# Patient Record
Sex: Male | Born: 1990 | Race: White | Hispanic: No | Marital: Single | Smoking: Current every day smoker
Health system: Southern US, Community
[De-identification: ages and names within clinical notes are randomized; demographics above are authoritative.]

## PROBLEM LIST (undated history)

## (undated) DIAGNOSIS — M549 Dorsalgia, unspecified: Secondary | ICD-10-CM

## (undated) DIAGNOSIS — K4090 Unilateral inguinal hernia, without obstruction or gangrene, not specified as recurrent: Secondary | ICD-10-CM

## (undated) DIAGNOSIS — R011 Cardiac murmur, unspecified: Secondary | ICD-10-CM

---
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
## 2008-01-11 ENCOUNTER — Emergency Department: Payer: Self-pay | Admitting: Emergency Medicine

## 2008-04-26 ENCOUNTER — Emergency Department: Payer: Self-pay | Admitting: Emergency Medicine

## 2008-08-03 ENCOUNTER — Emergency Department: Payer: Self-pay | Admitting: Unknown Physician Specialty

## 2008-08-18 ENCOUNTER — Emergency Department: Payer: Self-pay | Admitting: Emergency Medicine

## 2010-01-09 ENCOUNTER — Emergency Department: Payer: Self-pay | Admitting: Emergency Medicine

## 2010-03-13 ENCOUNTER — Emergency Department: Payer: Self-pay | Admitting: Emergency Medicine

## 2010-03-20 ENCOUNTER — Emergency Department: Payer: Self-pay | Admitting: Emergency Medicine

## 2010-05-29 ENCOUNTER — Emergency Department: Payer: Self-pay | Admitting: Emergency Medicine

## 2010-05-30 ENCOUNTER — Emergency Department: Payer: Self-pay | Admitting: Emergency Medicine

## 2011-02-10 ENCOUNTER — Emergency Department: Payer: Self-pay | Admitting: *Deleted

## 2011-02-16 ENCOUNTER — Emergency Department: Payer: Self-pay

## 2012-08-16 ENCOUNTER — Emergency Department: Payer: Self-pay | Admitting: Emergency Medicine

## 2012-09-27 ENCOUNTER — Emergency Department: Payer: Self-pay | Admitting: Emergency Medicine

## 2012-09-29 ENCOUNTER — Emergency Department: Payer: Self-pay | Admitting: Unknown Physician Specialty

## 2013-01-27 ENCOUNTER — Emergency Department: Payer: Self-pay | Admitting: Emergency Medicine

## 2013-01-27 LAB — CBC WITH DIFFERENTIAL/PLATELET
Basophil %: 0.7 %
Eosinophil #: 0.3 10*3/uL (ref 0.0–0.7)
Eosinophil %: 2.7 %
HCT: 45.4 % (ref 40.0–52.0)
Lymphocyte #: 3.8 10*3/uL — ABNORMAL HIGH (ref 1.0–3.6)
Lymphocyte %: 37.7 %
MCH: 29 pg (ref 26.0–34.0)
MCV: 87 fL (ref 80–100)
Monocyte %: 5.4 %
Neutrophil #: 5.4 10*3/uL (ref 1.4–6.5)
Neutrophil %: 53.5 %
WBC: 10.1 10*3/uL (ref 3.8–10.6)

## 2013-01-27 LAB — CK TOTAL AND CKMB (NOT AT ARMC)
CK, Total: 531 U/L — ABNORMAL HIGH (ref 35–232)
CK-MB: 3.1 ng/mL (ref 0.5–3.6)

## 2013-01-27 LAB — URINALYSIS, COMPLETE
Bacteria: NONE SEEN
Bilirubin,UR: NEGATIVE
Blood: NEGATIVE
Glucose,UR: NEGATIVE mg/dL (ref 0–75)
Ketone: NEGATIVE
Nitrite: NEGATIVE
Ph: 6 (ref 4.5–8.0)
RBC,UR: 3 /HPF (ref 0–5)

## 2013-01-27 LAB — BASIC METABOLIC PANEL
BUN: 13 mg/dL (ref 7–18)
Calcium, Total: 9 mg/dL (ref 8.5–10.1)
Chloride: 107 mmol/L (ref 98–107)
Co2: 28 mmol/L (ref 21–32)
EGFR (African American): >60
EGFR (Non-African Amer.): >60
Osmolality: 278 (ref 275–301)
Potassium: 4.2 mmol/L (ref 3.5–5.1)
Sodium: 139 mmol/L (ref 136–145)

## 2013-04-01 ENCOUNTER — Emergency Department: Payer: Self-pay | Admitting: Emergency Medicine

## 2013-04-02 LAB — COMPREHENSIVE METABOLIC PANEL
Albumin: 3.8 g/dL (ref 3.4–5.0)
Anion Gap: 5 — ABNORMAL LOW (ref 7–16)
Chloride: 105 mmol/L (ref 98–107)
Co2: 29 mmol/L (ref 21–32)
EGFR (Non-African Amer.): >60
Glucose: 107 mg/dL — ABNORMAL HIGH (ref 65–99)
Osmolality: 277 (ref 275–301)
Potassium: 4 mmol/L (ref 3.5–5.1)
Sodium: 139 mmol/L (ref 136–145)

## 2013-04-02 LAB — CBC WITH DIFFERENTIAL/PLATELET
Eosinophil #: 0.3 10*3/uL (ref 0.0–0.7)
Eosinophil %: 3.7 %
MCH: 27.8 pg (ref 26.0–34.0)
MCHC: 31.9 g/dL — ABNORMAL LOW (ref 32.0–36.0)
Monocyte #: 0.7 x10 3/mm (ref 0.2–1.0)
Monocyte %: 7.9 %
Neutrophil %: 53.9 %
RDW: 13.5 % (ref 11.5–14.5)
WBC: 9 10*3/uL (ref 3.8–10.6)

## 2013-09-11 DIAGNOSIS — R079 Chest pain, unspecified: Secondary | ICD-10-CM | POA: Insufficient documentation

## 2013-09-11 DIAGNOSIS — R0600 Dyspnea, unspecified: Secondary | ICD-10-CM | POA: Insufficient documentation

## 2013-09-11 DIAGNOSIS — R0609 Other forms of dyspnea: Secondary | ICD-10-CM | POA: Insufficient documentation

## 2013-12-26 ENCOUNTER — Emergency Department: Payer: Self-pay | Admitting: Emergency Medicine

## 2014-03-17 ENCOUNTER — Emergency Department (HOSPITAL_COMMUNITY)
Admission: EM | Admit: 2014-03-17 | Discharge: 2014-03-17 | Disposition: A | Discharge status: Home / Self Care | Payer: Self-pay | Attending: Emergency Medicine | Admitting: Emergency Medicine

## 2014-03-17 ENCOUNTER — Encounter (HOSPITAL_COMMUNITY): Payer: Self-pay | Admitting: *Deleted

## 2014-03-17 DIAGNOSIS — K4090 Unilateral inguinal hernia, without obstruction or gangrene, not specified as recurrent: Secondary | ICD-10-CM | POA: Insufficient documentation

## 2014-03-17 DIAGNOSIS — Z720 Tobacco use: Secondary | ICD-10-CM | POA: Insufficient documentation

## 2014-03-17 DIAGNOSIS — R011 Cardiac murmur, unspecified: Secondary | ICD-10-CM | POA: Insufficient documentation

## 2014-03-17 HISTORY — DX: Cardiac murmur, unspecified: R01.1

## 2014-03-17 HISTORY — DX: Unilateral inguinal hernia, without obstruction or gangrene, not specified as recurrent: K40.90

## 2014-03-17 MED ORDER — IBUPROFEN 800 MG PO TABS: 800.0000 mg | ORAL_TABLET | Freq: Once | ORAL | Status: DC

## 2014-03-17 MED ORDER — IBUPROFEN 800 MG PO TABS: 800.0000 mg | ORAL_TABLET | Freq: Three times a day (TID) | ORAL | Status: DC | PRN

## 2014-03-17 NOTE — Discharge Instructions (Signed)
Follow up with dr. Lovell SheehanJenkins next wee

## 2014-03-17 NOTE — ED Provider Notes (Signed)
CSN: [id number]     Arrival date & time 03/17/14  1901 History  This chart was scribed for Patrick LennertJoseph L Armenia Silveria, MD by Ronney LionSuzanne Le, ED Scribe. This patient was seen in room APA18/APA18 and the patient's care was started at 8:52 PM.    Chief Complaint  Patient presents with  . Groin Pain   Patient is a 23 y.o. male presenting with groin pain. The history is provided by the patient. No language interpreter was used.  Groin Pain This is a new problem. The current episode started more than 1 week ago. The problem occurs constantly. The problem has been gradually worsening. Pertinent negatives include no chest pain, no abdominal pain and no headaches.     HPI Comments: Filbert SchilderJonathon Selvage is a 23 y.o. male who presents to the Emergency Department complaining of constant, worsening groin pain accompanying a hernia that began few weeks ago. [facility] who did not refer him to a Careers advisersurgeon. Patient works in [employer].  Past Medical History  Diagnosis Date  . Heart murmur   . Hernia, inguinal    History reviewed. No pertinent past surgical history. History reviewed. No pertinent family history. History  Substance Use Topics  . Smoking status: Current Every Day Smoker  . Smokeless tobacco: Not on file  . Alcohol Use: No    Review of Systems  Constitutional: Negative for appetite change and fatigue.  HENT: Negative for congestion, ear discharge and sinus pressure.   Eyes: Negative for discharge.  Respiratory: Negative for cough.   Cardiovascular: Negative for chest pain.  Gastrointestinal: Negative for abdominal pain and diarrhea.  Genitourinary: Negative for frequency and hematuria.  Musculoskeletal: Negative for back pain.  Skin: Negative for rash.  Neurological: Negative for seizures and headaches.  Psychiatric/Behavioral: Negative for hallucinations.      Allergies  Review of patient's allergies indicates no known allergies.  Home Medications   Prior to  Admission medications   Not on File   BP 117/71 mmHg  Pulse 60  Temp(Src) 97.6 F (36.4 C) (Oral)  Resp 18  Ht 6' (1.829 m)  Wt 185 lb (83.915 kg)  BMI 25.08 kg/m2  SpO2 100% Physical Exam  Constitutional: He is oriented to person, place, and time. He appears well-developed.  HENT:  Head: Normocephalic.  Eyes: Conjunctivae and EOM are normal. No scleral icterus.  Neck: Neck supple. No thyromegaly present.  Cardiovascular: Normal rate and regular rhythm.  Exam reveals no gallop and no friction rub.   No murmur heard. Pulmonary/Chest: No stridor. He has no wheezes. He has no rales. He exhibits no tenderness.  Abdominal: He exhibits no distension. There is no tenderness. There is no rebound.  Genitourinary:  Tenderness to inguinal hernia on left side.  Musculoskeletal: Normal range of motion. He exhibits no edema.  Lymphadenopathy:    He has no cervical adenopathy.  Neurological: He is oriented to person, place, and time. He exhibits normal muscle tone. Coordination normal.  Skin: No rash noted. No erythema.  Psychiatric: He has a normal mood and affect. His behavior is normal.  Nursing note and vitals reviewed.   ED Course  Procedures (including critical care time)  DIAGNOSTIC STUDIES: Oxygen Saturation is 100% on RA, normal by my interpretation.    COORDINATION OF CARE: 8:53 PM - Discussed treatment plan with pt at bedside which includes pain medication and a referral to a surgeon and pt agreed to plan.   Labs Review Labs Reviewed - No data  to display  Imaging Review No results found.   EKG Interpretation None      MDM   Final diagnoses:  None    The chart was scribed for me under my direct supervision.  I personally performed the history, physical, and medical decision making and all procedures in the evaluation of this patient.Marland Kitchen. }    Patrick LennertJoseph L Legend Pecore, MD 03/18/14 0001

## 2014-03-17 NOTE — ED Notes (Signed)
Pt states he lifts lots of heavy items at work and feels like he has a hernia in his groin.

## 2014-05-15 ENCOUNTER — Emergency Department: Payer: Self-pay | Admitting: Student

## 2014-05-15 LAB — CBC WITH DIFFERENTIAL/PLATELET
BASOS PCT: 0.7 %
Basophil #: 0 10*3/uL (ref 0.0–0.1)
EOS PCT: 2.8 %
Eosinophil #: 0.2 10*3/uL (ref 0.0–0.7)
HCT: 41.6 % (ref 40.0–52.0)
HGB: 13.9 g/dL (ref 13.0–18.0)
Lymphocyte #: 3.4 10*3/uL (ref 1.0–3.6)
Lymphocyte %: 49.6 %
MCH: 28.6 pg (ref 26.0–34.0)
MCHC: 33.4 g/dL (ref 32.0–36.0)
MCV: 86 fL (ref 80–100)
MONO ABS: 0.5 x10 3/mm (ref 0.2–1.0)
MONOS PCT: 7.8 %
Neutrophil #: 2.7 10*3/uL (ref 1.4–6.5)
Neutrophil %: 39.1 %
PLATELETS: 182 10*3/uL (ref 150–440)
RBC: 4.86 10*6/uL (ref 4.40–5.90)
RDW: 13.3 % (ref 11.5–14.5)
WBC: 6.9 10*3/uL (ref 3.8–10.6)

## 2014-05-15 LAB — COMPREHENSIVE METABOLIC PANEL
ALK PHOS: 65 U/L (ref 46–116)
ALT: 46 U/L (ref 14–63)
Albumin: 3.6 g/dL (ref 3.4–5.0)
Anion Gap: 9 (ref 7–16)
BUN: 11 mg/dL (ref 7–18)
Bilirubin,Total: 0.3 mg/dL (ref 0.2–1.0)
CREATININE: 1.19 mg/dL (ref 0.60–1.30)
Calcium, Total: 8.6 mg/dL (ref 8.5–10.1)
Chloride: 105 mmol/L (ref 98–107)
Co2: 28 mmol/L (ref 21–32)
EGFR (African American): >60
EGFR (Non-African Amer.): >60
Glucose: 105 mg/dL — ABNORMAL HIGH (ref 65–99)
Osmolality: 283 (ref 275–301)
Potassium: 3.5 mmol/L (ref 3.5–5.1)
SGOT(AST): 25 U/L (ref 15–37)
Sodium: 142 mmol/L (ref 136–145)
TOTAL PROTEIN: 7.1 g/dL (ref 6.4–8.2)

## 2014-05-15 LAB — URINALYSIS, COMPLETE
BLOOD: NEGATIVE
Bacteria: NONE SEEN
Bilirubin,UR: NEGATIVE
Glucose,UR: NEGATIVE mg/dL (ref 0–75)
KETONE: NEGATIVE
Leukocyte Esterase: NEGATIVE
NITRITE: NEGATIVE
PH: 6 (ref 4.5–8.0)
Protein: NEGATIVE
RBC,UR: 2 /HPF (ref 0–5)
SPECIFIC GRAVITY: 1.024 (ref 1.003–1.030)
Squamous Epithelial: NONE SEEN
WBC UR: 21 /HPF (ref 0–5)

## 2014-05-15 LAB — LIPASE, BLOOD: Lipase: 164 U/L (ref 73–393)

## 2014-08-09 ENCOUNTER — Emergency Department
Admit: 2014-08-09 | Disposition: A | Discharge status: Home / Self Care | Payer: Self-pay | Admitting: Emergency Medicine

## 2014-08-09 LAB — COMPREHENSIVE METABOLIC PANEL
ALT: 38 U/L
ANION GAP: 8 (ref 7–16)
Albumin: 4.6 g/dL
Alkaline Phosphatase: 58 U/L
BUN: 13 mg/dL
Bilirubin,Total: 0.2 mg/dL — ABNORMAL LOW
CALCIUM: 9.4 mg/dL
CO2: 29 mmol/L
Chloride: 104 mmol/L
Creatinine: 1.06 mg/dL
EGFR (African American): >60
GLUCOSE: 96 mg/dL
Potassium: 3.7 mmol/L
SGOT(AST): 33 U/L
SODIUM: 141 mmol/L
Total Protein: 7.7 g/dL

## 2014-08-09 LAB — CBC
HCT: 43.6 % (ref 40.0–52.0)
HGB: 15 g/dL (ref 13.0–18.0)
MCH: 29.6 pg (ref 26.0–34.0)
MCHC: 34.3 g/dL (ref 32.0–36.0)
MCV: 86 fL (ref 80–100)
PLATELETS: 199 10*3/uL (ref 150–440)
RBC: 5.05 10*6/uL (ref 4.40–5.90)
RDW: 13.6 % (ref 11.5–14.5)
WBC: 8.2 10*3/uL (ref 3.8–10.6)

## 2014-08-09 LAB — LIPASE, BLOOD: LIPASE: 40 U/L

## 2014-08-09 LAB — TROPONIN I: Troponin-I: <0.03 ng/mL

## 2014-10-20 ENCOUNTER — Emergency Department: Payer: Self-pay

## 2014-10-20 ENCOUNTER — Emergency Department
Admission: EM | Admit: 2014-10-20 | Discharge: 2014-10-20 | Disposition: A | Discharge status: Home / Self Care | Payer: Self-pay | Attending: Emergency Medicine | Admitting: Emergency Medicine

## 2014-10-20 ENCOUNTER — Encounter: Payer: Self-pay | Admitting: Emergency Medicine

## 2014-10-20 DIAGNOSIS — S61219A Laceration without foreign body of unspecified finger without damage to nail, initial encounter: Secondary | ICD-10-CM

## 2014-10-20 DIAGNOSIS — S61215A Laceration without foreign body of left ring finger without damage to nail, initial encounter: Secondary | ICD-10-CM | POA: Insufficient documentation

## 2014-10-20 DIAGNOSIS — Y9289 Other specified places as the place of occurrence of the external cause: Secondary | ICD-10-CM | POA: Insufficient documentation

## 2014-10-20 DIAGNOSIS — S6710XA Crushing injury of unspecified finger(s), initial encounter: Secondary | ICD-10-CM

## 2014-10-20 DIAGNOSIS — Y9389 Activity, other specified: Secondary | ICD-10-CM | POA: Insufficient documentation

## 2014-10-20 DIAGNOSIS — W5522XA Struck by cow, initial encounter: Secondary | ICD-10-CM | POA: Insufficient documentation

## 2014-10-20 DIAGNOSIS — S67195A Crushing injury of left ring finger, initial encounter: Secondary | ICD-10-CM | POA: Insufficient documentation

## 2014-10-20 DIAGNOSIS — Y998 Other external cause status: Secondary | ICD-10-CM | POA: Insufficient documentation

## 2014-10-20 DIAGNOSIS — Z23 Encounter for immunization: Secondary | ICD-10-CM | POA: Insufficient documentation

## 2014-10-20 MED ORDER — LIDOCAINE HCL (PF) 1 % IJ SOLN
5.0000 mL | Freq: Once | INTRAMUSCULAR | Status: AC
  Administered 2014-10-20: 5 mL

## 2014-10-20 MED ORDER — KETOROLAC TROMETHAMINE 10 MG PO TABS
ORAL_TABLET | ORAL | Status: AC
  Filled 2014-10-20: qty 2

## 2014-10-20 MED ORDER — KETOROLAC TROMETHAMINE 10 MG PO TABS
20.0000 mg | ORAL_TABLET | Freq: Once | ORAL | Status: AC
  Administered 2014-10-20: 20 mg via ORAL

## 2014-10-20 MED ORDER — TETANUS-DIPHTH-ACELL PERTUSSIS 5-2.5-18.5 LF-MCG/0.5 IM SUSP
0.5000 mL | Freq: Once | INTRAMUSCULAR | Status: AC
  Administered 2014-10-20: 0.5 mL via INTRAMUSCULAR
  Filled 2014-10-20: qty 0.5

## 2014-10-20 MED ORDER — HYDROCODONE-ACETAMINOPHEN 5-325 MG PO TABS: 1.0000 | ORAL_TABLET | ORAL | Status: DC | PRN

## 2014-10-20 MED ORDER — SULFAMETHOXAZOLE-TRIMETHOPRIM 800-160 MG PO TABS
2.0000 | ORAL_TABLET | Freq: Two times a day (BID) | ORAL | Status: DC
Start: 2014-10-20 — End: 2015-07-29
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
## 2014-10-20 NOTE — Discharge Instructions (Signed)
Crush Injury, Fingers or Toes A crush injury to the fingers or toes means the tissues have been damaged by being squeezed (compressed). There will be bleeding into the tissues and swelling. Often, blood will collect under the skin. When this happens, the skin on the finger often dies and may slough off (shed) 1 week to 10 days later. Usually, new skin is growing underneath. If the injury has been too severe and the tissue does not survive, the damaged tissue may begin to turn black over several days.  Wounds which occur because of the crushing may be stitched (sutured) shut. However, crush injuries are more likely to become infected than other injuries.These wounds may not be closed as tightly as other types of cuts to prevent infection. Nails involved are often lost. These usually grow back over several weeks.  DIAGNOSIS X-rays may be taken to see if there is any injury to the bones. TREATMENT Broken bones (fractures) may be treated with splinting, depending on the fracture. Often, no treatment is required for fractures of the last bone in the fingers or toes. HOME CARE INSTRUCTIONS   The crushed part should be raised (elevated) above the heart or center of the chest as much as possible for the first several days or as directed. This helps with pain and lessens swelling. Less swelling increases the chances that the crushed part will survive.  Put ice on the injured area.  Put ice in a plastic bag.  Place a towel between your skin and the bag.  Leave the ice on for 15-20 minutes, 03-04 times a day for the first 2 days.  Only take over-the-counter or prescription medicines for pain, discomfort, or fever as directed by your caregiver.  Use your injured part only as directed.  Change your bandages (dressings) as directed.  Keep all follow-up appointments as directed by your caregiver. Not keeping your appointment could result in a chronic or permanent injury, pain, and disability. If there is  any problem keeping the appointment, you must call to reschedule. SEEK IMMEDIATE MEDICAL CARE IF:   There is redness, swelling, or increasing pain in the wound area.  Pus is coming from the wound.  You have a fever.  You notice a bad smell coming from the wound or dressing.  The edges of the wound do not stay together after the sutures have been removed.  You are unable to move the injured finger or toe. MAKE SURE YOU:   Understand these instructions.  Will watch your condition.  Will get help right away if you are not doing well or get worse. Document Released: 03/30/2005 Document Revised: 06/22/2011 Document Reviewed: 08/15/2010 [facility] Patient Information 2015 [place]. This information is not intended to replace advice given to you by your health care provider. Make sure you discuss any questions you have with your health care provider.  Laceration Care, Adult A laceration is a cut that goes through all layers of the skin. The cut goes into the tissue beneath the skin. HOME CARE For stitches (sutures) or staples:  Keep the cut clean and dry.  If you have a bandage (dressing), change it at least once a day. Change the bandage if it gets wet or dirty, or as told by your doctor.  Wash the cut with soap and water 2 times a day. Rinse the cut with water. Pat it dry with a clean towel.  Put a thin layer of medicated cream on the cut as told by your doctor.  You may shower after the first 24 hours. Do not soak the cut in water until the stitches are removed.  Only take medicines as told by your doctor.  Have your stitches or staples removed as told by your doctor. For skin adhesive strips:  Keep the cut clean and dry.  Do not get the strips wet. You may take a bath, but be careful to keep the cut dry.  If the cut gets wet, pat it dry with a clean towel.  The strips will fall off on their own. Do not remove the strips that are still stuck to the cut. For wound  glue:  You may shower or take baths. Do not soak or scrub the cut. Do not swim. Avoid heavy sweating until the glue falls off on its own. After a shower or bath, pat the cut dry with a clean towel.  Do not put medicine on your cut until the glue falls off.  If you have a bandage, do not put tape over the glue.  Avoid lots of sunlight or tanning lamps until the glue falls off. Put sunscreen on the cut for the first year to reduce your scar.  The glue will fall off on its own. Do not pick at the glue. You may need a tetanus shot if:  You cannot remember when you had your last tetanus shot.  You have never had a tetanus shot. If you need a tetanus shot and you choose not to have one, you may get tetanus. Sickness from tetanus can be serious. GET HELP RIGHT AWAY IF:   Your pain does not get better with medicine.  Your arm, hand, leg, or foot loses feeling (numbness) or changes color.  Your cut is bleeding.  Your joint feels weak, or you cannot use your joint.  You have painful lumps on your body.  Your cut is red, puffy (swollen), or painful.  You have a red line on the skin near the cut.  You have yellowish-white fluid (pus) coming from the cut.  You have a fever.  You have a bad smell coming from the cut or bandage.  Your cut breaks open before or after stitches are removed.  You notice something coming out of the cut, such as wood or glass.  You cannot move a finger or toe. MAKE SURE YOU:   Understand these instructions.  Will watch your condition.  Will get help right away if you are not doing well or get worse. Document Released: 09/16/2007 Document Revised: 06/22/2011 Document Reviewed: 09/23/2010 [facility] Patient Information 2015 [place], [place]. This information is not intended to replace advice given to you by your health care provider. Make sure you discuss any questions you have with your health care provider.  Keep the wound & dressing clean, dry, and  covered.  Take the antibiotic as directed.  Follow-up with Dr. Ernest Pine for any difficulty with finger ROM.  Return to the ED in 2 weeks for suture removal.  Return as needed for wound checks.

## 2014-10-20 NOTE — ED Notes (Signed)
Sterile dressing cover, instructions to keep clean and dry
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
## 2015-02-19 ENCOUNTER — Encounter: Payer: Self-pay | Admitting: Emergency Medicine

## 2015-02-19 ENCOUNTER — Emergency Department
Admission: EM | Admit: 2015-02-19 | Discharge: 2015-02-19 | Disposition: A | Discharge status: Home / Self Care | Payer: Self-pay | Attending: Emergency Medicine | Admitting: Emergency Medicine

## 2015-02-19 DIAGNOSIS — J301 Allergic rhinitis due to pollen: Secondary | ICD-10-CM | POA: Insufficient documentation

## 2015-02-19 DIAGNOSIS — L723 Sebaceous cyst: Secondary | ICD-10-CM | POA: Insufficient documentation

## 2015-02-19 DIAGNOSIS — Z79899 Other long term (current) drug therapy: Secondary | ICD-10-CM | POA: Insufficient documentation

## 2015-02-19 MED ORDER — CEPHALEXIN 500 MG PO CAPS: 500.0000 mg | ORAL_CAPSULE | Freq: Two times a day (BID) | ORAL | Status: DC

## 2015-02-19 MED ORDER — LORATADINE 10 MG PO TABS: 10.0000 mg | ORAL_TABLET | Freq: Every day | ORAL | Status: DC

## 2015-02-19 MED ORDER — DIPHENHYDRAMINE HCL 25 MG PO CAPS
50.0000 mg | ORAL_CAPSULE | Freq: Once | ORAL | Status: AC
  Administered 2015-02-19: 50 mg via ORAL
  Filled 2015-02-19: qty 2

## 2015-02-19 MED ORDER — LORATADINE 10 MG PO TABS
10.0000 mg | ORAL_TABLET | Freq: Once | ORAL | Status: AC
  Administered 2015-02-19: 10 mg via ORAL
  Filled 2015-02-19: qty 1

## 2015-02-19 NOTE — ED Notes (Addendum)
Patient ambulatory to triage with steady gait, without difficulty or distress noted; pt reports recently working around hay moving bales; c/o generalized itching since; st "I looked it up on the internet and I got a cold now, so it said I could have hay fever"

## 2015-02-19 NOTE — Discharge Instructions (Signed)
Sebaceous Cyst Removal Sebaceous cyst removal is a procedure to remove a sac of oily material that forms under your skin (sebaceous cyst). Sebaceous cysts may also be called epidermoid cysts or keratin cysts. Normally, the skin secretes this oily material through a gland or a hair follicle. This type of cyst usually results when a skin gland or hair follicle becomes blocked. You may need this procedure if you have a sebaceous cyst that becomes large, uncomfortable, or infected. LET YOUR HEALTH CARE PROVIDER KNOW ABOUT:  Any allergies you have.  All medicines you are taking, including vitamins, herbs, eye drops, creams, and over-the-counter medicines.  Previous problems you or members of your family have had with the use of anesthetics.  Any blood disorders you have.  Previous surgeries you have had.  Medical conditions you have. RISKS AND COMPLICATIONS Generally, this is a safe procedure. However, problems may occur, including:  Developing another cyst.  Bleeding.  Infection.  Scarring. BEFORE THE PROCEDURE  Ask your health care provider about:  Changing or stopping your regular medicines. This is especially important if you are taking diabetes medicines or blood thinners.  Taking medicines such as aspirin and ibuprofen. These medicines can thin your blood. Do not take these medicines before your procedure if your health care provider instructs you not to.  If you have an infected cyst, you may have to take antibiotic medicines before or after the cyst removal. Take your antibiotics as directed by your health care provider. Finish all of the medicine even if you start to feel better.  Take a shower on the morning of your procedure. Your health care provider may ask you to use a germ-killing (antiseptic) soap. PROCEDURE  You will be given a medicine that numbs the area (local anesthetic).  The skin around the cyst will be cleaned with a germ-killing solution  (antiseptic).  Your health care provider will make a small surgical incision over the cyst.  The cyst will be separated from the surrounding tissues that are under your skin.  If possible, the cyst will be removed undamaged (intact).  If the cyst bursts (ruptures), it will need to be removed in pieces.  After the cyst is removed, your health care provider will control any bleeding and close the incision with small stitches (sutures). Small incisions may not need sutures, and the bleeding will be controlled by applying direct pressure with gauze.  Your health care provider may apply antibiotic ointment and a light bandage (dressing) over the incision. This procedure may vary among health care providers and hospitals. AFTER THE PROCEDURE  If your cyst ruptured during surgery, you may need to take antibiotic medicine. If you were prescribed an antibiotic medicine, finish all of it even if you start to feel better.   This information is not intended to replace advice given to you by your health care provider. Make sure you discuss any questions you have with your health care provider.   Document Released: 03/27/2000 Document Revised: 04/20/2014 Document Reviewed: 12/13/2013 Elsevier Interactive Patient Education 2016 [employer]vier Inc.  

## 2015-02-19 NOTE — ED Provider Notes (Signed)
[facility] Emergency Department Provider Note  ____________________________________________  Time seen: 6:45 AM  I have reviewed the triage vital signs and the nursing notes.   HISTORY  Chief Complaint Rash     HPI Patrick Huffman is a 24 y.o. male presents with generalized pruritus and nasal congestion status post moving bales of hay yesterday. Patient denies any fever no dyspnea no cough. In addition patient admits to a swollen tender area on his left maxilla times one and half months.  Past Medical History none There are no active problems to display for this patient.  Past surgical history None  Current Outpatient Rx  Name  Route  Sig  Dispense  Refill  . HYDROcodone-acetaminophen (NORCO) 5-325 MG per tablet   Oral   Take 1 tablet by mouth every 4 (four) hours as needed for moderate pain.   6 tablet   0   . sulfamethoxazole-trimethoprim (BACTRIM DS,SEPTRA DS) 800-160 MG per tablet   Oral   Take 2 tablets by mouth 2 (two) times daily.   40 tablet   0     Allergies No known drug allergies  No family history on file.  Social History Social History  Substance Use Topics  . Smoking status: Never Smoker   . Smokeless tobacco: None  . Alcohol Use: No    Review of Systems  Constitutional: Negative for fever. Eyes: Negative for visual changes. ENT: Negative for sore throat. Cardiovascular: Negative for chest pain. Respiratory: Negative for shortness of breath. Gastrointestinal: Negative for abdominal pain, vomiting and diarrhea. Genitourinary: Negative for dysuria. Musculoskeletal: Negative for back pain. Skin: Positive for rash. Neurological: Negative for headaches, focal weakness or numbness.   10-point ROS otherwise negative.  ____________________________________________   PHYSICAL EXAM:  VITAL SIGNS: ED Triage Vitals  Enc Vitals Group     BP 02/19/15 0431 124/68 mmHg     Pulse Rate 02/19/15 0431 62     Resp  02/19/15 0431 18     Temp 02/19/15 0431 98.1 F (36.7 C)     Temp Source 02/19/15 0431 Oral     SpO2 02/19/15 0431 98 %     Weight 02/19/15 0431 185 lb (83.915 kg)     Height 02/19/15 0431 6' (1.829 m)     Head Cir --      Peak Flow --      Pain Score --      Pain Loc --      Pain Edu? --      Excl. in GC? --      Constitutional: Alert and oriented. Well appearing and in no distress. Eyes: Conjunctivae are normal. PERRL. Normal extraocular movements. ENT   Head: Normocephalic and atraumatic.   Nose: No congestion/rhinnorhea.   Mouth/Throat: Mucous membranes are moist.   Neck: No stridor. Hematological/Lymphatic/Immunilogical: No cervical lymphadenopathy. Cardiovascular: Normal rate, regular rhythm. Normal and symmetric distal pulses are present in all extremities. No murmurs, rubs, or gallops. Respiratory: Normal respiratory effort without tachypnea nor retractions. Breath sounds are clear and equal bilaterally. No wheezes/rales/rhonchi. Gastrointestinal: Soft and nontender. No distention. There is no CVA tenderness. Genitourinary: deferred Musculoskeletal: Nontender with normal range of motion in all extremities. No joint effusions.  No lower extremity tenderness nor edema. Neurologic:  Normal speech and language. No gross focal neurologic deficits are appreciated. Speech is normal.  Skin:  Petechial rash noted on the patient's chest and bilateral upper extremities. Patient also has a 1" x 1" ovoid swollen area on his left  maxilla consistent with a sebaceous cyst Psychiatric: Mood and affect are normal. Speech and behavior are normal. Patient exhibits appropriate insight and judgment.     INITIAL IMPRESSION / ASSESSMENT AND PLAN / ED COURSE  Pertinent labs & imaging results that were available during my care of the patient were reviewed by me and considered in my medical decision making (see chart for details).  History of physical exam consistent with acute  allergic reaction as such patient received Benadryl 50 mg as well as loratadine 10 mg by mouth  ____________________________________________   FINAL CLINICAL IMPRESSION(S) / ED DIAGNOSES  Final diagnoses:  Hay fever  Sebaceous cyst      Darci Current, MD 02/19/15 [phone]

## 2015-02-20 ENCOUNTER — Emergency Department: Payer: No Typology Code available for payment source

## 2015-02-20 ENCOUNTER — Emergency Department
Admission: EM | Admit: 2015-02-20 | Discharge: 2015-02-20 | Disposition: A | Discharge status: Home / Self Care | Payer: No Typology Code available for payment source | Attending: Emergency Medicine | Admitting: Emergency Medicine

## 2015-02-20 DIAGNOSIS — S3991XA Unspecified injury of abdomen, initial encounter: Secondary | ICD-10-CM | POA: Insufficient documentation

## 2015-02-20 DIAGNOSIS — S0990XA Unspecified injury of head, initial encounter: Secondary | ICD-10-CM | POA: Insufficient documentation

## 2015-02-20 DIAGNOSIS — Y92410 Unspecified street and highway as the place of occurrence of the external cause: Secondary | ICD-10-CM | POA: Insufficient documentation

## 2015-02-20 DIAGNOSIS — Y990 Civilian activity done for income or pay: Secondary | ICD-10-CM | POA: Diagnosis not present

## 2015-02-20 DIAGNOSIS — Y9389 Activity, other specified: Secondary | ICD-10-CM | POA: Diagnosis not present

## 2015-02-20 DIAGNOSIS — S199XXA Unspecified injury of neck, initial encounter: Secondary | ICD-10-CM | POA: Insufficient documentation

## 2015-02-20 DIAGNOSIS — Z79899 Other long term (current) drug therapy: Secondary | ICD-10-CM | POA: Insufficient documentation

## 2015-02-20 DIAGNOSIS — S5001XA Contusion of right elbow, initial encounter: Secondary | ICD-10-CM | POA: Insufficient documentation

## 2015-02-20 DIAGNOSIS — Z792 Long term (current) use of antibiotics: Secondary | ICD-10-CM | POA: Insufficient documentation

## 2015-02-20 MED ORDER — KETOROLAC TROMETHAMINE 30 MG/ML IJ SOLN
60.0000 mg | Freq: Once | INTRAMUSCULAR | Status: AC
Start: 2015-02-20 — End: 2015-02-20
  Administered 2015-02-20: 60 mg via INTRAMUSCULAR
  Filled 2015-02-20: qty 2

## 2015-02-20 MED ORDER — TRAMADOL HCL 50 MG PO TABS: 50.0000 mg | ORAL_TABLET | Freq: Four times a day (QID) | ORAL | Status: DC | PRN
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
## 2015-02-20 NOTE — Discharge Instructions (Signed)
Concussion, Adult °A concussion, or closed-head injury, is a brain injury caused by a direct blow to the head or by a quick and sudden movement (jolt) of the head or neck. Concussions are usually not life-threatening. Even so, the effects of a concussion can be serious. If you have had a concussion before, you are more likely to experience concussion-like symptoms after a direct blow to the head.  °CAUSES °· Direct blow to the head, such as from running into another player during a soccer game, being hit in a fight, or hitting your head on a hard surface. °· A jolt of the head or neck that causes the brain to move back and forth inside the skull, such as in a car crash. °SIGNS AND SYMPTOMS °The signs of a concussion can be hard to notice. Early on, they may be missed by you, family members, and health care providers. You may look fine but act or feel differently. °Symptoms are usually temporary, but they may last for days, weeks, or even longer. Some symptoms may appear right away while others may not show up for hours or days. Every head injury is different. Symptoms include: °· Mild to moderate headaches that will not go away. °· A feeling of pressure inside your head. °· Having more trouble than usual: °¨ Learning or remembering things you have heard. °¨ Answering questions. °¨ Paying attention or concentrating. °¨ Organizing daily tasks. °¨ Making decisions and solving problems. °· Slowness in thinking, acting or reacting, speaking, or reading. °· Getting lost or being easily confused. °· Feeling tired all the time or lacking energy (fatigued). °· Feeling drowsy. °· Sleep disturbances. °¨ Sleeping more than usual. °¨ Sleeping less than usual. °¨ Trouble falling asleep. °¨ Trouble sleeping (insomnia). °· Loss of balance or feeling lightheaded or dizzy. °· Nausea or vomiting. °· Numbness or tingling. °· Increased sensitivity to: °¨ Sounds. °¨ Lights. °¨ Distractions. °· Vision problems or eyes that tire  easily. °· Diminished sense of taste or smell. °· Ringing in the ears. °· Mood changes such as feeling sad or anxious. °· Becoming easily irritated or angry for little or no reason. °· Lack of motivation. °· Seeing or hearing things other people do not see or hear (hallucinations). °DIAGNOSIS °Your health care provider can usually diagnose a concussion based on a description of your injury and symptoms. He or she will ask whether you passed out (lost consciousness) and whether you are having trouble remembering events that happened right before and during your injury. °Your evaluation might include: °· A brain scan to look for signs of injury to the brain. Even if the test shows no injury, you may still have a concussion. °· Blood tests to be sure other problems are not present. °TREATMENT °· Concussions are usually treated in an emergency department, in urgent care, or at a clinic. You may need to stay in the hospital overnight for further treatment. °· Tell your health care provider if you are taking any medicines, including prescription medicines, over-the-counter medicines, and natural remedies. Some medicines, such as blood thinners (anticoagulants) and aspirin, may increase the chance of complications. Also tell your health care provider whether you have had alcohol or are taking illegal drugs. This information may affect treatment. °· Your health care provider will send you home with important instructions to follow. °· How fast you will recover from a concussion depends on many factors. These factors include how severe your concussion is, what part of your brain was injured,   your age, and how healthy you were before the concussion. °· Most people with mild injuries recover fully. Recovery can take time. In general, recovery is slower in older persons. Also, persons who have had a concussion in the past or have other medical problems may find that it takes longer to recover from their current injury. °HOME  CARE INSTRUCTIONS °General Instructions °· Carefully follow the directions your health care provider gave you. °· Only take over-the-counter or prescription medicines for pain, discomfort, or fever as directed by your health care provider. °· Take only those medicines that your health care provider has approved. °· Do not drink alcohol until your health care provider says you are well enough to do so. Alcohol and certain other drugs may slow your recovery and can put you at risk of further injury. °· If it is harder than usual to remember things, write them down. °· If you are easily distracted, try to do one thing at a time. For example, do not try to watch TV while fixing dinner. °· Talk with family members or close friends when making important decisions. °· Keep all follow-up appointments. Repeated evaluation of your symptoms is recommended for your recovery. °· Watch your symptoms and tell others to do the same. Complications sometimes occur after a concussion. Older adults with a brain injury may have a higher risk of serious complications, such as a blood clot on the brain. °· Tell your teachers, school nurse, school counselor, coach, athletic trainer, or work manager about your injury, symptoms, and restrictions. Tell them about what you can or cannot do. They should watch for: °¨ Increased problems with attention or concentration. °¨ Increased difficulty remembering or learning new information. °¨ Increased time needed to complete tasks or assignments. °¨ Increased irritability or decreased ability to cope with stress. °¨ Increased symptoms. °· Rest. Rest helps the brain to heal. Make sure you: °¨ Get plenty of sleep at night. Avoid staying up late at night. °¨ Keep the same bedtime hours on weekends and weekdays. °¨ Rest during the day. Take daytime naps or rest breaks when you feel tired. °· Limit activities that require a lot of thought or concentration. These include: °¨ Doing homework or job-related  work. °¨ Watching TV. °¨ Working on the computer. °· Avoid any situation where there is potential for another head injury (football, hockey, soccer, basketball, martial arts, downhill snow sports and horseback riding). Your condition will get worse every time you experience a concussion. You should avoid these activities until you are evaluated by the appropriate follow-up health care providers. °Returning To Your Regular Activities °You will need to return to your normal activities slowly, not all at once. You must give your body and brain enough time for recovery. °· Do not return to sports or other athletic activities until your health care provider tells you it is safe to do so. °· Ask your health care provider when you can drive, ride a bicycle, or operate heavy machinery. Your ability to react may be slower after a brain injury. Never do these activities if you are dizzy. °· Ask your health care provider about when you can return to work or school. °Preventing Another Concussion °It is very important to avoid another brain injury, especially before you have recovered. In rare cases, another injury can lead to permanent brain damage, brain swelling, or death. The risk of this is greatest during the first 7-10 days after a head injury. Avoid injuries by: °· Wearing a   seat belt when riding in a car.  Drinking alcohol only in moderation.  Wearing a helmet when biking, skiing, skateboarding, skating, or doing similar activities.  Avoiding activities that could lead to a second concussion, such as contact or recreational sports, until your health care provider says it is okay.  Taking safety measures in your home.  Remove clutter and tripping hazards from floors and stairways.  Use grab bars in bathrooms and handrails by stairs.  Place non-slip mats on floors and in bathtubs.  Improve lighting in dim areas. SEEK MEDICAL CARE IF:  You have increased problems paying attention or  concentrating.  You have increased difficulty remembering or learning new information.  You need more time to complete tasks or assignments than before.  You have increased irritability or decreased ability to cope with stress.  You have more symptoms than before. Seek medical care if you have any of the following symptoms for more than 2 weeks after your injury:  Lasting (chronic) headaches.  Dizziness or balance problems.  Nausea.  Vision problems.  Increased sensitivity to noise or light.  Depression or mood swings.  Anxiety or irritability.  Memory problems.  Difficulty concentrating or paying attention.  Sleep problems.  Feeling tired all the time. SEEK IMMEDIATE MEDICAL CARE IF:  You have severe or worsening headaches. These may be a sign of a blood clot in the brain.  You have weakness (even if only in one hand, leg, or part of the face).  You have numbness.  You have decreased coordination.  You vomit repeatedly.  You have increased sleepiness.  One pupil is larger than the other.  You have convulsions.  You have slurred speech.  You have increased confusion. This may be a sign of a blood clot in the brain.  You have increased restlessness, agitation, or irritability.  You are unable to recognize people or places.  You have neck pain.  It is difficult to wake you up.  You have unusual behavior changes.  You lose consciousness. MAKE SURE YOU:  Understand these instructions.  Will watch your condition.  Will get help right away if you are not doing well or get worse.   This information is not intended to replace advice given to you by your health care provider. Make sure you discuss any questions you have with your health care provider.   Document Released: 06/20/2003 Document Revised: 04/20/2014 Document Reviewed: 10/20/2012 [employer] Interactive Patient Education [employer]2016 [employer]  Please return immediately if condition worsens.  Please contact her primary physician or the physician you were given for referral. If you have any specialist physicians involved in her treatment and plan please also contact them. Thank you for using [facility].  Please rest, ice, and elevate the right elbow or any other areas of discomfort. Return to emergency department especially for focal weakness, increasing chest pain, increasing abdominal pain, or any new concerns.

## 2015-05-16 ENCOUNTER — Emergency Department
Admission: EM | Admit: 2015-05-16 | Discharge: 2015-05-17 | Disposition: A | Discharge status: Home / Self Care | Payer: Self-pay | Attending: Emergency Medicine | Admitting: Emergency Medicine

## 2015-05-16 DIAGNOSIS — Z79899 Other long term (current) drug therapy: Secondary | ICD-10-CM | POA: Insufficient documentation

## 2015-05-16 DIAGNOSIS — R1012 Left upper quadrant pain: Secondary | ICD-10-CM | POA: Insufficient documentation

## 2015-05-16 DIAGNOSIS — F17200 Nicotine dependence, unspecified, uncomplicated: Secondary | ICD-10-CM | POA: Insufficient documentation

## 2015-05-16 DIAGNOSIS — Z792 Long term (current) use of antibiotics: Secondary | ICD-10-CM | POA: Insufficient documentation

## 2015-05-16 DIAGNOSIS — R110 Nausea: Secondary | ICD-10-CM | POA: Insufficient documentation

## 2015-05-16 LAB — CBC
HCT: 47.1 % (ref 40.0–52.0)
HEMOGLOBIN: 15.7 g/dL (ref 13.0–18.0)
MCH: 28.6 pg (ref 26.0–34.0)
MCHC: 33.3 g/dL (ref 32.0–36.0)
MCV: 85.8 fL (ref 80.0–100.0)
Platelets: 199 10*3/uL (ref 150–440)
RBC: 5.48 MIL/uL (ref 4.40–5.90)
RDW: 13.6 % (ref 11.5–14.5)
WBC: 6.9 10*3/uL (ref 3.8–10.6)

## 2015-05-16 LAB — COMPREHENSIVE METABOLIC PANEL
ALBUMIN: 4.8 g/dL (ref 3.5–5.0)
ALT: 49 U/L (ref 17–63)
ANION GAP: 6 (ref 5–15)
AST: 43 U/L — ABNORMAL HIGH (ref 15–41)
Alkaline Phosphatase: 68 U/L (ref 38–126)
BILIRUBIN TOTAL: 0.4 mg/dL (ref 0.3–1.2)
BUN: 14 mg/dL (ref 6–20)
CALCIUM: 9.4 mg/dL (ref 8.9–10.3)
CO2: 29 mmol/L (ref 22–32)
Chloride: 102 mmol/L (ref 101–111)
Creatinine, Ser: 0.97 mg/dL (ref 0.61–1.24)
GLUCOSE: 126 mg/dL — AB (ref 65–99)
POTASSIUM: 4.1 mmol/L (ref 3.5–5.1)
Sodium: 137 mmol/L (ref 135–145)
Total Protein: 8 g/dL (ref 6.5–8.1)

## 2015-05-16 LAB — URINALYSIS COMPLETE WITH MICROSCOPIC (ARMC ONLY)
Bacteria, UA: NONE SEEN
Bilirubin Urine: NEGATIVE
GLUCOSE, UA: NEGATIVE mg/dL
HGB URINE DIPSTICK: NEGATIVE
Ketones, ur: NEGATIVE mg/dL
Nitrite: NEGATIVE
PROTEIN: NEGATIVE mg/dL
SPECIFIC GRAVITY, URINE: 1.017 (ref 1.005–1.030)
Squamous Epithelial / LPF: NONE SEEN
pH: 5 (ref 5.0–8.0)

## 2015-05-16 LAB — LIPASE, BLOOD: Lipase: 33 U/L (ref 11–51)

## 2015-05-16 NOTE — ED Notes (Signed)
Pt c/o abdominal pain x 4 days. Pt reports increased pain at night. Pt reports LUQ that radiates into lower abdomen.

## 2015-05-17 ENCOUNTER — Encounter: Payer: Self-pay | Admitting: Radiology

## 2015-05-17 ENCOUNTER — Emergency Department: Payer: Self-pay

## 2015-05-17 MED ORDER — IOHEXOL 300 MG/ML  SOLN
100.0000 mL | Freq: Once | INTRAMUSCULAR | Status: AC | PRN
  Administered 2015-05-17: 100 mL via INTRAVENOUS

## 2015-05-17 MED ORDER — ONDANSETRON HCL 4 MG/2ML IJ SOLN
INTRAMUSCULAR | Status: AC
  Administered 2015-05-17: 4 mg via INTRAVENOUS
  Filled 2015-05-17: qty 2

## 2015-05-17 MED ORDER — HYDROCODONE-ACETAMINOPHEN 5-325 MG PO TABS: 1.0000 | ORAL_TABLET | ORAL | Status: DC | PRN

## 2015-05-17 MED ORDER — ONDANSETRON HCL 4 MG/2ML IJ SOLN
4.0000 mg | Freq: Once | INTRAMUSCULAR | Status: AC
  Administered 2015-05-17: 4 mg via INTRAVENOUS

## 2015-05-17 MED ORDER — IOHEXOL 240 MG/ML SOLN
25.0000 mL | Freq: Once | INTRAMUSCULAR | Status: AC | PRN
  Administered 2015-05-17: 25 mL via ORAL

## 2015-05-17 MED ORDER — DOCUSATE SODIUM 100 MG PO CAPS: ORAL_CAPSULE | ORAL | Status: DC

## 2015-05-17 NOTE — ED Notes (Signed)
Patient transported to CT 

## 2015-05-17 NOTE — ED Provider Notes (Signed)
[facility] Emergency Department Provider Note  ____________________________________________  Time seen: Approximately 12:03 AM  I have reviewed the triage vital signs and the nursing notes.   HISTORY  Chief Complaint Abdominal Pain    HPI [name] is a 25 y.o. male who is otherwise healthy and presents with 4 days of worsening left upper quadrant abdominal pain.  He states that he did not have a viral prodrome but started feeling left upper quadrant "swelling" and dull aching pain that is now sharp and stabbing and severe in intensity.  Movement and straining make it worse, rest makes it a little bit better.  It has been accompanied with nausea but no vomiting, no diarrhea, no constipation.  He has not had similar issues in the past.  He is concerned because his father has a history of stomach ulcers and reported that it felt similar.  The symptoms started after eating a meal about 4 days ago and have been gradual in onset and getting slowly worse over time.  He denies fever/chills, congestion, sore throat, chest pain, shortness of breath, vomiting, dysuria.   History reviewed. No pertinent past medical history.  There are no active problems to display for this patient.   History reviewed. No pertinent past surgical history.  Current Outpatient Rx  Name  Route  Sig  Dispense  Refill  . cephALEXin (KEFLEX) 500 MG capsule   Oral   Take 1 capsule (500 mg total) by mouth 2 (two) times daily.   20 capsule   0   . docusate sodium (COLACE) 100 MG capsule      Take 1 tablet once or twice daily as needed for constipation while taking narcotic pain medicine   30 capsule   0   .           . loratadine (CLARITIN) 10 MG tablet   Oral   Take 1 tablet (10 mg total) by mouth daily.   30 tablet   0   . sulfamethoxazole-trimethoprim (BACTRIM DS,SEPTRA DS) 800-160 MG per tablet   Oral   Take 2 tablets by mouth 2 (two) times daily.   40 tablet   0      Allergies Review of patient's allergies indicates no known allergies.  History reviewed. No pertinent family history.  Social History Social History  Substance Use Topics  . Smoking status: Current Every Day Smoker  . Smokeless tobacco: None  . Alcohol Use: No    Review of Systems Constitutional: No fever/chills Eyes: No visual changes. ENT: No sore throat. Cardiovascular: Denies chest pain. Respiratory: Denies shortness of breath. Gastrointestinal: Worsening left upper quadrant abdominal pain associated with nausea with no vomiting, diarrhea, nor constipation Genitourinary: Negative for dysuria. Musculoskeletal: Negative for back pain. Skin: Negative for rash. Neurological: Negative for headaches, focal weakness or numbness.  10-point ROS otherwise negative.  ____________________________________________   PHYSICAL EXAM:  VITAL SIGNS: ED Triage Vitals  Enc Vitals Group     BP 05/16/15 2013 131/68 mmHg     Pulse Rate 05/16/15 2013 82     Resp 05/16/15 2013 18     Temp 05/16/15 2013 98.2 F (36.8 C)     Temp src --      SpO2 05/16/15 2013 98 %     Weight 05/16/15 2013 185 lb (83.915 kg)     Height 05/16/15 2013 6' (1.829 m)     Head Cir --      Peak Flow --  Pain Score 05/16/15 2021 6     Pain Loc --      Pain Edu? --      Excl. in GC? --     Constitutional: Alert and oriented. Well appearing and in no acute distress. Eyes: Conjunctivae are normal. PERRL. EOMI. Head: Atraumatic. Nose: No congestion/rhinnorhea. Mouth/Throat: Mucous membranes are moist.  Oropharynx non-erythematous. Neck: No stridor.   Cardiovascular: Normal rate, regular rhythm. Grossly normal heart sounds.  Good peripheral circulation. Respiratory: Normal respiratory effort.  No retractions. Lungs CTAB. Gastrointestinal: Moderate tenderness to palpation of the left upper quadrant with voluntary guarding and no rebound tenderness.  No pain in McBurney's point.  Negative Murphy's sign  with no right upper quadrant tenderness and no epigastric tenderness to palpation. Rectal:  Declined by patient Musculoskeletal: No lower extremity tenderness nor edema.  No joint effusions. Neurologic:  Normal speech and language. No gross focal neurologic deficits are appreciated.  Skin:  Skin is warm, dry and intact. No rash noted. Psychiatric: Mood and affect are normal. Speech and behavior are normal.  ____________________________________________   LABS (all labs ordered are listed, but only abnormal results are displayed)  Labs Reviewed  COMPREHENSIVE METABOLIC PANEL - Abnormal; Notable for the following:    Glucose, Bld 126 (*)    AST 43 (*)    All other components within normal limits  URINALYSIS COMPLETEWITH MICROSCOPIC ([facility] ONLY) - Abnormal; Notable for the following:    Color, Urine YELLOW (*)    APPearance CLEAR (*)    Leukocytes, UA TRACE (*)    All other components within normal limits  LIPASE, BLOOD  CBC   ____________________________________________  EKG  None ____________________________________________  RADIOLOGY   Ct Abdomen Pelvis W Contrast  05/17/2015  CLINICAL DATA:  Left upper and left lower quadrant abdominal pain for 4 days. Left upper quadrant tenderness. Nausea. Question splenomegaly. EXAM: CT ABDOMEN AND PELVIS WITH CONTRAST TECHNIQUE: Multidetector CT imaging of the abdomen and pelvis was performed using the standard protocol following bolus administration of intravenous contrast. CONTRAST:  OMNIPAQUE IOHEXOL 300 MG/ML  SOLN COMPARISON:  CT 01/27/2013 FINDINGS: Lower chest:  The included lung bases are clear. Liver: Normal in size, no focal lesion. Hepatobiliary: Gallbladder physiologically distended, no calcified stone. No biliary dilatation. Pancreas: No ductal dilatation or inflammation. Spleen: Normal. Normal in size measuring 12 x 7 x 3.6 x 9.5 cm. No focal lesion. Adrenal glands: No nodule. Kidneys: Symmetric renal enhancement. No  hydronephrosis. No perinephric stranding. No localizing renal abnormality. Small accessory lower pole left renal artery incidentally noted. Stomach/Bowel: Stomach physiologically distended. There are no dilated or thickened small bowel loops. Moderate volume of stool throughout the colon without colonic wall thickening. The appendix is normal. Vascular/Lymphatic: No retroperitoneal adenopathy. Abdominal aorta is normal in caliber. Reproductive: Normal for age. Bladder: Physiologically distended, no wall thickening. Other: No free air, free fluid, or intra-abdominal fluid collection. Musculoskeletal: There are no acute or suspicious osseous abnormalities. IMPRESSION: No acute abnormality in the abdomen/pelvis. No findings to explain left upper quadrant pain. Particularly, normal spleen. Electronically Signed   By: Rubye Oaks M.D.   On: 05/17/2015 01:26    ____________________________________________   PROCEDURES  Procedure(s) performed: None  Critical Care performed: No ____________________________________________   INITIAL IMPRESSION / ASSESSMENT AND PLAN / ED COURSE  Pertinent labs & imaging results that were available during my care of the patient were reviewed by me and considered in my medical decision making (see chart for details).  Difficult to appreciate the  presence or absence of splenomegaly given the patient's voluntary guarding but the location is appropriate.  No recent viral syndrome to suggest a viral etiology is splenomegaly and tenderness.  The patient declined a rectal exam at this time to look for occult blood.  Given his moderate tenderness and gradual onset and worsening over the last few days I will evaluate with a CT scan of the abdomen and pelvis with oral and IV contrast.  ----------------------------------------- 2:07 AM on 05/17/2015 -----------------------------------------  The patient is lying in bed comfortably watching TV.  The CT scan was unremarkable.   The etiology is most likely musculoskeletal but I explained to the patient that I do not know for sure but his entire workup has been reassuring.  Given the discomfort is having particularly at night I prescribed Norco and advised the use of over-the-counter pain medication as well.  He will follow up with Phineas Real whom he has seen in the past for primary care.  I gave my usual and customary return precautions.      ____________________________________________  FINAL CLINICAL IMPRESSION(S) / ED DIAGNOSES  Final diagnoses:  LUQ abdominal pain      NEW MEDICATIONS STARTED DURING THIS VISIT:  New Prescriptions   DOCUSATE SODIUM (COLACE) 100 MG CAPSULE    Take 1 tablet once or twice daily as needed for constipation while taking narcotic pain medicine   HYDROCODONE-ACETAMINOPHEN (NORCO/VICODIN) 5-325 MG TABLET    Take 1-2 tablets by mouth every 4 (four) hours as needed for moderate pain.     Loleta Rose, MD 05/17/15 [phone]

## 2015-05-17 NOTE — Discharge Instructions (Signed)
You have been seen in the Emergency Department (ED) for abdominal pain.  Your evaluation did not identify a clear cause of your symptoms but was generally reassuring. ° °Please follow up as instructed above regarding today’s emergent visit and the symptoms that are bothering you. ° °Return to the ED if your abdominal pain worsens or fails to improve, you develop bloody vomiting, bloody diarrhea, you are unable to tolerate fluids due to vomiting, fever greater than 101, or other symptoms that concern you. ° ° °Abdominal Pain, Adult °Many things can cause abdominal pain. Usually, abdominal pain is not caused by a disease and will improve without treatment. It can often be observed and treated at home. Your health care provider will do a physical exam and possibly order blood tests and X-rays to help determine the seriousness of your pain. However, in many cases, more time must pass before a clear cause of the pain can be found. Before that point, your health care provider may not know if you need more testing or further treatment. °HOME CARE INSTRUCTIONS °Monitor your abdominal pain for any changes. The following actions may help to alleviate any discomfort you are experiencing: °· Only take over-the-counter or prescription medicines as directed by your health care provider. °· Do not take laxatives unless directed to do so by your health care provider. °· Try a clear liquid diet (broth, tea, or water) as directed by your health care provider. Slowly move to a bland diet as tolerated. °SEEK MEDICAL CARE IF: °· You have unexplained abdominal pain. °· You have abdominal pain associated with nausea or diarrhea. °· You have pain when you urinate or have a bowel movement. °· You experience abdominal pain that wakes you in the night. °· You have abdominal pain that is worsened or improved by eating food. °· You have abdominal pain that is worsened with eating fatty foods. °· You have a fever. °SEEK IMMEDIATE MEDICAL CARE  IF: °· Your pain does not go away within 2 hours. °· You keep throwing up (vomiting). °· Your pain is felt only in portions of the abdomen, such as the right side or the left lower portion of the abdomen. °· You pass bloody or black tarry stools. °MAKE SURE YOU: °· Understand these instructions. °· Will watch your condition. °· Will get help right away if you are not doing well or get worse. °  °This information is not intended to replace advice given to you by your health care provider. Make sure you discuss any questions you have with your health care provider. °  °Document Released: 01/07/2005 Document Revised: 12/19/2014 Document Reviewed: 12/07/2012 °Elsevier Interactive Patient Education ©2016 [employer] ° °

## 2015-07-29 ENCOUNTER — Emergency Department
Admission: EM | Admit: 2015-07-29 | Discharge: 2015-07-29 | Disposition: A | Discharge status: Home / Self Care | Payer: Self-pay | Attending: Emergency Medicine | Admitting: Emergency Medicine

## 2015-07-29 ENCOUNTER — Encounter: Payer: Self-pay | Admitting: Emergency Medicine

## 2015-07-29 ENCOUNTER — Emergency Department: Payer: Self-pay

## 2015-07-29 DIAGNOSIS — J209 Acute bronchitis, unspecified: Secondary | ICD-10-CM | POA: Insufficient documentation

## 2015-07-29 DIAGNOSIS — F17200 Nicotine dependence, unspecified, uncomplicated: Secondary | ICD-10-CM | POA: Insufficient documentation

## 2015-07-29 DIAGNOSIS — R197 Diarrhea, unspecified: Secondary | ICD-10-CM | POA: Insufficient documentation

## 2015-07-29 MED ORDER — AZITHROMYCIN 250 MG PO TABS: ORAL_TABLET | ORAL | Status: DC

## 2015-07-29 MED ORDER — ALBUTEROL SULFATE HFA 108 (90 BASE) MCG/ACT IN AERS
1.0000 | INHALATION_SPRAY | Freq: Four times a day (QID) | RESPIRATORY_TRACT | Status: DC | PRN
Start: 2015-07-29 — End: 2015-07-29

## 2015-07-29 MED ORDER — PSEUDOEPH-BROMPHEN-DM 30-2-10 MG/5ML PO SYRP: 10.0000 mL | ORAL_SOLUTION | Freq: Four times a day (QID) | ORAL | Status: DC | PRN

## 2015-07-29 MED ORDER — PREDNISONE 20 MG PO TABS: ORAL_TABLET | ORAL | Status: DC

## 2015-07-29 NOTE — ED Notes (Signed)
Pt presents to ED with headache and nasal congestion. Pt reports productive cough. Pt reports diarrhea.

## 2015-07-29 NOTE — Discharge Instructions (Signed)

## 2015-07-29 NOTE — ED Notes (Signed)
States he developed headache  With some nasal congestion a few days ago..with occasional cough   Afebrile on arrival to ED

## 2015-07-29 NOTE — ED Provider Notes (Signed)
[facility] Emergency Department Provider Note  ____________________________________________  Time seen: Approximately 4:02 PM  I have reviewed the triage vital signs and the nursing notes.   HISTORY  Chief Complaint Headache and Diarrhea    HPI Patrick Huffman is a 25 y.o. male , NAD, presents to the emergency department with 3 day history of cough, chest congestion, nasal congestion, headache, dry mouth and diarrhea. Also notes a swelling about his left sternum in which he thinks is a lymph node based on internet research. Has never had pain or swelling in this area. Denies injuries or traumas to the chest. Has taken liquid DayQuill with no relief of symptoms. Denies sore throat, sinus pressure. No rash. No fevers, chills, body aches. No known ill exposures. Is a pack a day tobacco smoker. Was unable to go to work today due to symptoms.   Past Medical History  Diagnosis Date  . Heart murmur   . Hernia, inguinal     There are no active problems to display for this patient.   History reviewed. No pertinent past surgical history.  Current Outpatient Rx  Name  Route  Sig  Dispense  Refill  . azithromycin (ZITHROMAX Z-PAK) 250 MG tablet      Take 2 tablets (500 mg) on  Day 1,  followed by 1 tablet (250 mg) once daily on Days 2 through 5.   6 each   0   . brompheniramine-pseudoephedrine-DM 30-2-10 MG/5ML syrup   Oral   Take 10 mLs by mouth 4 (four) times daily as needed.   200 mL   0     Allergies Prednisone  No family history on file.  Social History Social History  Substance Use Topics  . Smoking status: Current Every Day Smoker  . Smokeless tobacco: None  . Alcohol Use: No     Review of Systems  Constitutional: No fever/chills Eyes: No visual changes. No discharge, redness, swelling ENT: Positive nasal congestion, dry mouth. No sore throat, ear pain, sinus pressure. Cardiovascular: No chest pain. Respiratory: Positive chest  congestion, cough. No shortness of breath. No wheezing.  Gastrointestinal: Positive diarrhea but no abdominal pain.  No nausea, vomiting. No constipation. Genitourinary: Negative for dysuria. No hematuria. No urinary hesitancy, urgency or increased frequency. Musculoskeletal: Sternal swelling and pain. Negative for back, neck pain.  Skin: Negative for rash, bruising, skin sores. Neurological: Positive for headaches, but no focal weakness or numbness. 10-point ROS otherwise negative.  ____________________________________________   PHYSICAL EXAM:  VITAL SIGNS: ED Triage Vitals  Enc Vitals Group     BP 07/29/15 1551 143/73 mmHg     Pulse Rate 07/29/15 1551 67     Resp 07/29/15 1551 18     Temp 07/29/15 1551 97.9 F (36.6 C)     Temp Source 07/29/15 1551 Oral     SpO2 07/29/15 1551 98 %     Weight 07/29/15 1551 185 lb (83.915 kg)     Height 07/29/15 1551 6' (1.829 m)     Head Cir --      Peak Flow --      Pain Score 07/29/15 1557 3     Pain Loc --      Pain Edu? --      Excl. in GC? --      Constitutional: Alert and oriented. Well appearing and in no acute distress. Eyes: Conjunctivae are normal. PERRL. EOMI without pain.  Head: Atraumatic. ENT:      Ears: TMs visualized bilaterally  without effusion, bulging, perforation. No swelling, erythema, discharge of the external ear canals.      Nose: Mild congestion/rhinnorhea.      Mouth/Throat: Mucous membranes are moist. Pharynx without erythema, swelling, exudate. Uvula is midline. Neck: No stridor. Supple with FROM.  Hematological/Lymphatic/Immunilogical: No cervical nor supraclavicular lymphadenopathy. Cardiovascular: Normal rate, regular rhythm. Normal S1 and S2. No murmurs, runs, gallops. Good peripheral circulation. Respiratory: Normal respiratory effort without tachypnea or retractions. Trace wheeze noted about right upper lung field. All other lung fields are CTAB with breath sounds noted throughout.  Musculoskeletal:  Tenderness to palpation about the right side of the sternum with mild soft tissue swelling. No crepitus or bony abnormalities to palpation. No lower extremity tenderness nor edema.  No joint effusions. Neurologic:  Normal speech and language. No gross focal neurologic deficits are appreciated.  Skin:  Skin is warm, dry and intact. No rash noted. Psychiatric: Mood and affect are normal. Speech and behavior are normal. Patient exhibits appropriate insight and judgement.   ____________________________________________   LABS (all labs ordered are listed, but only abnormal results are displayed)  Labs Reviewed - No data to display ____________________________________________  EKG  None ____________________________________________  RADIOLOGY I have personally viewed and evaluated these images (plain radiographs) as part of my medical decision making, as well as reviewing the written report by the radiologist.  Dg Chest 2 View  07/29/2015  CLINICAL DATA:  Cough, congestion weakness since 07/25/2015. Initial encounter. EXAM: CHEST  2 VIEW COMPARISON:  PA and lateral chest 08/09/2014 and 04/02/2013. FINDINGS: The lungs are clear. Heart size is normal. No pneumothorax or pleural effusion. No focal bony abnormality. IMPRESSION: No acute disease. Electronically Signed   By: Drusilla Kannerhomas  Dalessio M.D.   On: 07/29/2015 16:29    ____________________________________________    PROCEDURES  Procedure(s) performed: None    Medications - No data to display   ____________________________________________   INITIAL IMPRESSION / ASSESSMENT AND PLAN / ED COURSE  Pertinent imaging results that were available during my care of the patient were reviewed by me and considered in my medical decision making (see chart for details).  Patient's diagnosis is consistent with acute bronchitis. Patient will be discharged home with prescriptions for Zithromax and Bromfed-DM syrup to take as directed. May continue  Tylenol or Motrin as needed for pain. [facility] symptoms persist past this treatment course. Patient is given ED precautions to return to the ED for any worsening or new symptoms.      ____________________________________________  FINAL CLINICAL IMPRESSION(S) / ED DIAGNOSES  Final diagnoses:  Acute bronchitis, unspecified organism      NEW MEDICATIONS STARTED DURING THIS VISIT:  New Prescriptions   AZITHROMYCIN (ZITHROMAX Z-PAK) 250 MG TABLET    Take 2 tablets (500 mg) on  Day 1,  followed by 1 tablet (250 mg) once daily on Days 2 through 5.   BROMPHENIRAMINE-PSEUDOEPHEDRINE-DM 30-2-10 MG/5ML SYRUP    Take 10 mLs by mouth 4 (four) times daily as needed.         Hope PigeonJami L Hagler, PA-C 07/29/15 1649  Jene Everyobert Kinner, MD 08/02/15 0030

## 2015-10-28 ENCOUNTER — Emergency Department
Admission: EM | Admit: 2015-10-28 | Discharge: 2015-10-28 | Disposition: A | Discharge status: Home / Self Care | Payer: No Typology Code available for payment source | Attending: Emergency Medicine | Admitting: Emergency Medicine

## 2015-10-28 DIAGNOSIS — F17200 Nicotine dependence, unspecified, uncomplicated: Secondary | ICD-10-CM | POA: Insufficient documentation

## 2015-10-28 DIAGNOSIS — N342 Other urethritis: Secondary | ICD-10-CM

## 2015-10-28 DIAGNOSIS — Z792 Long term (current) use of antibiotics: Secondary | ICD-10-CM | POA: Insufficient documentation

## 2015-10-28 DIAGNOSIS — N341 Nonspecific urethritis: Secondary | ICD-10-CM | POA: Insufficient documentation

## 2015-10-28 LAB — URINALYSIS COMPLETE WITH MICROSCOPIC (ARMC ONLY)
Bilirubin Urine: NEGATIVE
GLUCOSE, UA: NEGATIVE mg/dL
KETONES UR: NEGATIVE mg/dL
NITRITE: NEGATIVE
PROTEIN: NEGATIVE mg/dL
SPECIFIC GRAVITY, URINE: 1.019 (ref 1.005–1.030)
pH: 6 (ref 5.0–8.0)

## 2015-10-28 NOTE — ED Notes (Signed)
States he developed some dysuria yesterday after having intercourse.also felt like he could only void a small amt at a time. States it does not burn with urination at present but now having lower back pain

## 2015-10-28 NOTE — ED Notes (Signed)
Pt c/o painful urination with lower back pain since yesterday..Marland Kitchen

## 2015-10-28 NOTE — ED Provider Notes (Signed)
[facility] Emergency Department Provider Note  ____________________________________________  Time seen: Approximately 3:44 PM  I have reviewed the triage vital signs and the nursing notes.   HISTORY  Chief Complaint Urinary Frequency    HPI Patrick Huffman is a 25 y.o. male who presents to emergency department complaining of dysuria and lower back pain. Patient reports that after having sexual intercourse yesterday he felt some some burning to genitalia. He reports that intercourse was "long and somewhat rough." Patient reports that when he went to the bathroom after intercourse he had dysuria described as a burning sensation while urinating. Patient denies any blood in urine or penile discharge. Patient reports that he started drinking more fluids hoping to "flush whatever it was out." Patient reports that he is continuing to have some mild dysuria and now has diffuse lower back pain. Patient denies any history of kidney stones or STDs. Patient has not had any penile discharge. No fevers or chills. No abdominal pain, nausea vomiting, diarrhea or constipation.   Past Medical History  Diagnosis Date  . Heart murmur   . Hernia, inguinal     There are no active problems to display for this patient.   History reviewed. No pertinent past surgical history.  Current Outpatient Rx  Name  Route  Sig  Dispense  Refill  . azithromycin (ZITHROMAX Z-PAK) 250 MG tablet      Take 2 tablets (500 mg) on  Day 1,  followed by 1 tablet (250 mg) once daily on Days 2 through 5.   6 each   0   . brompheniramine-pseudoephedrine-DM 30-2-10 MG/5ML syrup   Oral   Take 10 mLs by mouth 4 (four) times daily as needed.   200 mL   0     Allergies Prednisone  No family history on file.  Social History Social History  Substance Use Topics  . Smoking status: Current Every Day Smoker  . Smokeless tobacco: None  . Alcohol Use: No     Review of Systems   Constitutional: No fever/chills Cardiovascular: no chest pain. Respiratory: no cough. No SOB. Gastrointestinal: No abdominal pain.  No nausea, no vomiting.  No diarrhea.  No constipation. Genitourinary: Positive for dysuria. No hematuria Musculoskeletal: Negative for musculoskeletal pain. Skin: Negative for rash, abrasions, lacerations, ecchymosis. Neurological: Negative for headaches, focal weakness or numbness. 10-point ROS otherwise negative.  ____________________________________________   PHYSICAL EXAM:  VITAL SIGNS: ED Triage Vitals  Enc Vitals Group     BP 10/28/15 1519 133/77 mmHg     Pulse Rate 10/28/15 1519 97     Resp 10/28/15 1519 17     Temp 10/28/15 1519 98.1 F (36.7 C)     Temp Source 10/28/15 1519 Oral     SpO2 10/28/15 1519 100 %     Weight 10/28/15 1519 180 lb (81.647 kg)     Height 10/28/15 1519 6' (1.829 m)     Head Cir --      Peak Flow --      Pain Score 10/28/15 1514 7     Pain Loc --      Pain Edu? --      Excl. in GC? --      Constitutional: Alert and oriented. Well appearing and in no acute distress. Eyes: Conjunctivae are normal. PERRL. EOMI. Head: Atraumatic. Cardiovascular: Normal rate, regular rhythm. Normal S1 and S2.  Good peripheral circulation. Respiratory: Normal respiratory effort without tachypnea or retractions. Lungs CTAB. Good air entry to the  bases with no decreased or absent breath sounds. Gastrointestinal: Bowel sounds 4 quadrants. Soft and nontender to palpation. No guarding or rigidity. No palpable masses. No distention. No CVA tenderness. Musculoskeletal: Full range of motion to all extremities. No gross deformities appreciated.Patient is moderately tender to palpation in the paraspinal muscle group of the lumbar region. No palpable abnormality. No midline tenderness. Full range of motion bilateral lower extremities. Sensation and pulses intact distally. Neurologic:  Normal speech and language. No gross focal neurologic  deficits are appreciated.  Skin:  Skin is warm, dry and intact. No rash noted. Psychiatric: Mood and affect are normal. Speech and behavior are normal. Patient exhibits appropriate insight and judgement.   ____________________________________________   LABS (all labs ordered are listed, but only abnormal results are displayed)  Labs Reviewed  URINALYSIS COMPLETEWITH MICROSCOPIC ([facility] ONLY) - Abnormal; Notable for the following:    Color, Urine YELLOW (*)    APPearance HAZY (*)    Hgb urine dipstick 3+ (*)    Leukocytes, UA 3+ (*)    Bacteria, UA FEW (*)    Squamous Epithelial / LPF 0-5 (*)    All other components within normal limits   ____________________________________________  EKG   ____________________________________________  RADIOLOGY   No results found.  ____________________________________________    PROCEDURES  Procedure(s) performed:       Medications - No data to display   ____________________________________________   INITIAL IMPRESSION / ASSESSMENT AND PLAN / ED COURSE  Pertinent labs & imaging results that were available during my care of the patient were reviewed by me and considered in my medical decision making (see chart for details).  Patient's diagnosis is consistent with Urethritis. This complaint began after patient reported having "long and somewhat rough" sexual intercourse. Patient's symptoms have began to improve over the last 24 hours. Exam is reassuring with no CVA tenderness or abdominal tenderness. No lesions or penile discharge.. Patient is encouraged to drink plenty of fluids and use Tylenol and Motrin for symptom relief. If symptoms persist he will follow up with primary care provider for further evaluation. Patient is given ED precautions to return to the ED for any worsening or new symptoms.     ____________________________________________  FINAL CLINICAL IMPRESSION(S) / ED DIAGNOSES  Final diagnoses:  Urethritis       NEW MEDICATIONS STARTED DURING THIS VISIT:  Discharge Medication List as of 10/28/2015  5:58 PM          This chart was dictated using voice recognition software/Dragon. Despite best efforts to proofread, errors can occur which can change the meaning. Any change was purely unintentional.    Racheal PatchesJonathan D Cuthriell, PA-C 11/01/15 [id number]  Governor Rooksebecca Lord, MD 11/02/15 [phone]

## 2015-10-28 NOTE — Discharge Instructions (Signed)
Urethritis, Adult °Urethritis is an inflammation of the tube through which urine exits your bladder (urethra).  °CAUSES °Urethritis is often caused by an infection in your urethra. The infection can be viral, like herpes. The infection can also be bacterial, like gonorrhea. °RISK FACTORS °Risk factors of urethritis include: °· Having sex without using a condom. °· Having multiple sexual partners. °· Having poor hygiene. °SIGNS AND SYMPTOMS °Symptoms of urethritis are less noticeable in women than in men. These symptoms include: °· Burning feeling when you urinate (dysuria). °· Discharge from your urethra. °· Blood in your urine (hematuria). °· Urinating more than usual. °DIAGNOSIS  °To confirm a diagnosis of urethritis, your health care provider will do the following: °· Ask about your sexual history. °· Perform a physical exam. °· Have you provide a sample of your urine for lab testing. °· Use a cotton swab to gently collect a sample from your urethra for lab testing. °TREATMENT  °It is important to treat urethritis. Depending on the cause, untreated urethritis may lead to serious genital infections and possibly infertility. Urethritis caused by a bacterial infection is treated with antibiotic medicine. All sexual partners must be treated.  °HOME CARE INSTRUCTIONS °· Do not have sex until the test results are known and treatment is completed, even if your symptoms go away before you finish treatment. °· If you were prescribed an antibiotic, finish it all even if you start to feel better. °SEEK MEDICAL CARE IF:  °· Your symptoms are not improved in 3 days. °· Your symptoms are getting worse. °· You develop abdominal pain or pelvic pain (in women). °· You develop joint pain. °· You have a fever. °SEEK IMMEDIATE MEDICAL CARE IF:  °· You have severe pain in the belly, back, or side. °· You have repeated vomiting. °MAKE SURE YOU: °· Understand these instructions. °· Will watch your condition. °· Will get help right away  if you are not doing well or get worse. °  °This information is not intended to replace advice given to you by your health care provider. Make sure you discuss any questions you have with your health care provider. °  °Document Released: 09/23/2000 Document Revised: 08/14/2014 Document Reviewed: 11/28/2012 °Elsevier Interactive Patient Education ©2016 [employer] ° °

## 2016-01-27 ENCOUNTER — Encounter: Payer: Self-pay | Admitting: Emergency Medicine

## 2016-01-27 ENCOUNTER — Emergency Department
Admission: EM | Admit: 2016-01-27 | Discharge: 2016-01-27 | Disposition: A | Discharge status: Home / Self Care | Payer: Self-pay | Attending: Emergency Medicine | Admitting: Emergency Medicine

## 2016-01-27 DIAGNOSIS — J069 Acute upper respiratory infection, unspecified: Secondary | ICD-10-CM | POA: Insufficient documentation

## 2016-01-27 DIAGNOSIS — F17200 Nicotine dependence, unspecified, uncomplicated: Secondary | ICD-10-CM | POA: Insufficient documentation

## 2016-01-27 DIAGNOSIS — B9789 Other viral agents as the cause of diseases classified elsewhere: Secondary | ICD-10-CM

## 2016-01-27 MED ORDER — CETIRIZINE HCL 10 MG PO TABS: 10.0000 mg | ORAL_TABLET | Freq: Every day | ORAL | 0 refills | Status: DC

## 2016-01-27 MED ORDER — PSEUDOEPH-BROMPHEN-DM 30-2-10 MG/5ML PO SYRP
10.0000 mL | ORAL_SOLUTION | Freq: Four times a day (QID) | ORAL | 0 refills | Status: DC | PRN
Start: 2016-01-27 — End: 2019-10-05

## 2016-01-27 MED ORDER — FLUTICASONE PROPIONATE 50 MCG/ACT NA SUSP: 1.0000 | Freq: Two times a day (BID) | NASAL | 0 refills | Status: DC

## 2016-01-27 NOTE — ED Triage Notes (Signed)
Pt ambulatory to triage with steady gait, no distress noted. Pt reports he was sick over the weekend and his job will not accept him back without a doctors note. Pt denies N/V/D or fever at time of triage.

## 2016-01-27 NOTE — ED Provider Notes (Signed)
[facility] Emergency Department Provider Note  ____________________________________________  Time seen: Approximately 8:06 PM  I have reviewed the triage vital signs and the nursing notes.   HISTORY  Chief Complaint No chief complaint on file.    HPI Patrick Huffman is a 25 y.o. male who presents emergency department requesting a work note to return to work. Patient states that over the weekend he had fever, body aches, nasal congestion, sore throat, and coughing. Patient states that he used multiple over-the-counter occasions with good result. Patient states that he tried to return to work today and was told by his boss that he would require work note to return to work. Patient still endorses mild nasal congestion and sore throat with occasional cough. Patient states he has been fever free for at least one day. Patient denies any visual changes, neck pain, chest pain, shortness of breath, abdominal pain, nausea or vomiting.   Past Medical History:  Diagnosis Date  . Heart murmur   . Hernia, inguinal     There are no active problems to display for this patient.   History reviewed. No pertinent surgical history.  Prior to Admission medications   Medication Sig Start Date End Date Taking? Authorizing Provider  brompheniramine-pseudoephedrine-DM 30-2-10 MG/5ML syrup Take 10 mLs by mouth 4 (four) times daily as needed. 01/27/16   Delorise RoyalsJonathan D Cuthriell, PA-C  cetirizine (ZYRTEC) 10 MG tablet Take 1 tablet (10 mg total) by mouth daily. 01/27/16   Delorise RoyalsJonathan D Cuthriell, PA-C  fluticasone (FLONASE) 50 MCG/ACT nasal spray Place 1 spray into both nostrils 2 (two) times daily. 01/27/16   Delorise RoyalsJonathan D Cuthriell, PA-C    Allergies Prednisone  History reviewed. No pertinent family history.  Social History Social History  Substance Use Topics  . Smoking status: Current Every Day Smoker  . Smokeless tobacco: Never Used  . Alcohol use No     Review of Systems   Constitutional: No fever/chills Eyes: No visual changes. No discharge ENT: Positive for nasal congestion and sore throat. Cardiovascular: no chest pain. Respiratory: no cough. No SOB. Gastrointestinal: No abdominal pain.  No nausea, no vomiting.  No diarrhea.  No constipation. Musculoskeletal: Negative for musculoskeletal pain. Skin: Negative for rash, abrasions, lacerations, ecchymosis. Neurological: Endorses mild headache but denies focal weakness or numbness. 10-point ROS otherwise negative.  ____________________________________________   PHYSICAL EXAM:  VITAL SIGNS: ED Triage Vitals [01/27/16 1912]  Enc Vitals Group     BP 139/81     Pulse Rate 62     Resp 16     Temp 98.2 F (36.8 C)     Temp Source Oral     SpO2 100 %     Weight 180 lb (81.6 kg)     Height 6' (1.829 m)     Head Circumference      Peak Flow      Pain Score      Pain Loc      Pain Edu?      Excl. in GC?      Constitutional: Alert and oriented. Well appearing and in no acute distress. Eyes: Conjunctivae are normal. PERRL. EOMI. Head: Atraumatic. ENT:      Ears: EACs and TMs unremarkable bilaterally      Nose: Mild congestion/rhinnorhea.      Mouth/Throat: Mucous membranes are moist. Pharynx is mildly erythematous but nonedematous. Uvula is midline. Tonsils are slightly erythematous but nonedematous and no exudates are identified Neck: No stridor.  Neck is supple with full  range of motion Hematological/Lymphatic/Immunilogical: No cervical lymphadenopathy Cardiovascular: Normal rate, regular rhythm. Normal S1 and S2.  Good peripheral circulation. Respiratory: Normal respiratory effort without tachypnea or retractions. Lungs CTAB. Good air entry to the bases with no decreased or absent breath sounds. Musculoskeletal: Full range of motion to all extremities. No gross deformities appreciated. Neurologic:  Normal speech and language. No gross focal neurologic deficits are appreciated.  Skin:  Skin is  warm, dry and intact. No rash noted. Psychiatric: Mood and affect are normal. Speech and behavior are normal. Patient exhibits appropriate insight and judgement.   ____________________________________________   LABS (all labs ordered are listed, but only abnormal results are displayed)  Labs Reviewed - No data to display ____________________________________________  EKG   ____________________________________________  RADIOLOGY   No results found.  ____________________________________________    PROCEDURES  Procedure(s) performed:    Procedures    Medications - No data to display   ____________________________________________   INITIAL IMPRESSION / ASSESSMENT AND PLAN / ED COURSE  Pertinent labs & imaging results that were available during my care of the patient were reviewed by me and considered in my medical decision making (see chart for details).  Review of the [place] CSRS was performed in accordance of the [facility] prior to dispensing any controlled drugs.  Clinical Course    Patient's diagnosis is consistent with Viral upper respiratory infection. This does appear to be improving at this time. Patient will be cleared to go back to work.. Patient will be discharged home with prescriptions for Flonase, Zyrtec, cough syrup for symptom relief.. Patient is to follow up with primary care as needed or otherwise directed. Patient is given ED precautions to return to the ED for any worsening or new symptoms.     ____________________________________________  FINAL CLINICAL IMPRESSION(S) / ED DIAGNOSES  Final diagnoses:  Viral URI with cough      NEW MEDICATIONS STARTED DURING THIS VISIT:  Discharge Medication List as of 01/27/2016  8:19 PM    START taking these medications   Details  cetirizine (ZYRTEC) 10 MG tablet Take 1 tablet (10 mg total) by mouth daily., Starting Mon 01/27/2016, Print    fluticasone (FLONASE) 50 MCG/ACT nasal spray Place 1 spray into  both nostrils 2 (two) times daily., Starting Mon 01/27/2016, Print            This chart was dictated using voice recognition software/Dragon. Despite best efforts to proofread, errors can occur which can change the meaning. Any change was purely unintentional.    KANISHK STROEBEL, PA-C 01/27/16 2050    Sharyn Creamer, MD 01/28/16 [phone]

## 2016-05-07 ENCOUNTER — Encounter: Payer: Self-pay | Admitting: Urgent Care

## 2016-05-07 ENCOUNTER — Emergency Department
Admission: EM | Admit: 2016-05-07 | Discharge: 2016-05-07 | Disposition: A | Discharge status: Home / Self Care | Payer: Self-pay | Attending: Emergency Medicine | Admitting: Emergency Medicine

## 2016-05-07 DIAGNOSIS — G43A0 Cyclical vomiting, not intractable: Secondary | ICD-10-CM | POA: Insufficient documentation

## 2016-05-07 DIAGNOSIS — F17200 Nicotine dependence, unspecified, uncomplicated: Secondary | ICD-10-CM | POA: Insufficient documentation

## 2016-05-07 DIAGNOSIS — R1115 Cyclical vomiting syndrome unrelated to migraine: Secondary | ICD-10-CM

## 2016-05-07 MED ORDER — PROMETHAZINE HCL 25 MG PO TABS: 25.0000 mg | ORAL_TABLET | Freq: Four times a day (QID) | ORAL | 0 refills | Status: DC | PRN

## 2016-05-07 NOTE — ED Triage Notes (Signed)
Patient presents with c/o acute VGE symptoms; daughter has the same. (+) N/V today; took OTC medications that "helped". Patient with (+) PO intake. He is noted to be coloring in triage. States, "To tell you the truth. I didn't go to work today. I am just here for a note to say that I shouldn't have gone to work. I only need the note to keep me from getting in trouble. I feel better". RN advised patient that the work excuse would be provider dependant, however we could give him documentation stating that he was seen in the ED tonight at around 8pm.

## 2016-05-07 NOTE — ED Provider Notes (Signed)
[facility] Emergency Department Provider Note   ____________________________________________   First MD Initiated Contact with Patient 05/07/16 2131     (approximate)  I have reviewed the triage vital signs and the nursing notes.   HISTORY  Chief Complaint Nausea and Emesis    HPI Patrick Huffman is a 26 y.o. male 's with a one-day history of vomiting. Patient states started this morning and is progressed all day long. Denies any Diarrhea.   Past Medical History:  Diagnosis Date  . Heart murmur   . Hernia, inguinal     There are no active problems to display for this patient.   History reviewed. No pertinent surgical history.  Prior to Admission medications   Medication Sig Start Date End Date Taking? Authorizing Provider  brompheniramine-pseudoephedrine-DM 30-2-10 MG/5ML syrup Take 10 mLs by mouth 4 (four) times daily as needed. 01/27/16   Delorise Royals Cuthriell, PA-C  cetirizine (ZYRTEC) 10 MG tablet Take 1 tablet (10 mg total) by mouth daily. 01/27/16   Delorise Royals Cuthriell, PA-C  fluticasone (FLONASE) 50 MCG/ACT nasal spray Place 1 spray into both nostrils 2 (two) times daily. 01/27/16   Delorise Royals Cuthriell, PA-C  promethazine (PHENERGAN) 25 MG tablet Take 1 tablet (25 mg total) by mouth every 6 (six) hours as needed for nausea or vomiting. 05/07/16   Evangeline Dakin, PA-C    Allergies Prednisone  No family history on file.  Social History Social History  Substance Use Topics  . Smoking status: Current Every Day Smoker  . Smokeless tobacco: Never Used  . Alcohol use No    Review of Systems Constitutional: No fever/chills Eyes: No visual changes. ENT: No sore throat. Cardiovascular: Denies chest pain. Respiratory: Denies shortness of breath. Gastrointestinal: Positive abdominal pain.  Positive for nausea, positive vomiting.  No diarrhea.  No constipation. Genitourinary: Negative for dysuria. Musculoskeletal: Negative for  back pain. Skin: Negative for rash. Neurological: Negative for headaches, focal weakness or numbness.  10-point ROS otherwise negative.  ____________________________________________   PHYSICAL EXAM:  VITAL SIGNS: ED Triage Vitals  Enc Vitals Group     BP 05/07/16 2005 122/64     Pulse Rate 05/07/16 2005 77     Resp 05/07/16 2005 18     Temp 05/07/16 2005 98.6 F (37 C)     Temp Source 05/07/16 2005 Oral     SpO2 05/07/16 2005 99 %     Weight 05/07/16 2005 180 lb (81.6 kg)     Height 05/07/16 2005 6' (1.829 m)     Head Circumference --      Peak Flow --      Pain Score 05/07/16 2008 0     Pain Loc --      Pain Edu? --      Excl. in GC? --     Constitutional: Alert and oriented. Well appearing and in no acute distress. Cardiovascular: Normal rate, regular rhythm. Grossly normal heart sounds.  Good peripheral circulation. Respiratory: Normal respiratory effort.  No retractions. Lungs CTAB. Gastrointestinal: Soft and nontender. No distention. No abdominal bruits. No CVA tenderness. Musculoskeletal: No lower extremity tenderness nor edema.  No joint effusions. Neurologic:  Normal speech and language. No gross focal neurologic deficits are appreciated. No gait instability. Skin:  Skin is warm, dry and intact. No rash noted. Psychiatric: Mood and affect are normal. Speech and behavior are normal.  ____________________________________________   LABS (all labs ordered are listed, but only abnormal results are displayed)  Labs  Reviewed - No data to display ____________________________________________  EKG   ____________________________________________  RADIOLOGY   ____________________________________________   PROCEDURES  Procedure(s) performed: None  Procedures  Critical Care performed: No  ____________________________________________   INITIAL IMPRESSION / ASSESSMENT AND PLAN / ED COURSE  Pertinent labs & imaging results that were available during my  care of the patient were reviewed by me and considered in my medical decision making (see chart for details).  Acute gastroenteritis. Rx given for Phenergan 25 mg every 6 hours as needed for nausea and vomiting. Work excuse 24 hours given. Patient follow-up PCP or return to ER with any worsening symptomology.      ____________________________________________   FINAL CLINICAL IMPRESSION(S) / ED DIAGNOSES  Final diagnoses:  Non-intractable cyclical vomiting with nausea      NEW MEDICATIONS STARTED DURING THIS VISIT:  New Prescriptions   PROMETHAZINE (PHENERGAN) 25 MG TABLET    Take 1 tablet (25 mg total) by mouth every 6 (six) hours as needed for nausea or vomiting.     Note:  This document was prepared using Dragon voice recognition software and may include unintentional dictation errors.   Evangeline Dakinharles M Rebecah Dangerfield, PA-C 05/07/16 2135    Phineas SemenGraydon Goodman, MD 05/07/16 2229

## 2016-05-07 NOTE — ED Notes (Signed)
Patient was sick this morning and woke up vomiting.  He says he took Dayquil and the last time he threw up was 3 hours ago.  He says he needs a work note for today.  He says he has some lingering fatigue and no other medical complaints at this time.

## 2016-05-09 ENCOUNTER — Encounter: Payer: Self-pay | Admitting: *Deleted

## 2016-05-09 ENCOUNTER — Emergency Department
Admission: EM | Admit: 2016-05-09 | Discharge: 2016-05-09 | Disposition: A | Discharge status: Home / Self Care | Payer: Self-pay | Attending: Emergency Medicine | Admitting: Emergency Medicine

## 2016-05-09 DIAGNOSIS — Z9114 Patient's other noncompliance with medication regimen: Secondary | ICD-10-CM

## 2016-05-09 DIAGNOSIS — Z79899 Other long term (current) drug therapy: Secondary | ICD-10-CM | POA: Insufficient documentation

## 2016-05-09 DIAGNOSIS — F17200 Nicotine dependence, unspecified, uncomplicated: Secondary | ICD-10-CM | POA: Insufficient documentation

## 2016-05-09 DIAGNOSIS — F1910 Other psychoactive substance abuse, uncomplicated: Secondary | ICD-10-CM

## 2016-05-09 DIAGNOSIS — T507X1A Poisoning by analeptics and opioid receptor antagonists, accidental (unintentional), initial encounter: Secondary | ICD-10-CM | POA: Insufficient documentation

## 2016-05-09 HISTORY — DX: Dorsalgia, unspecified: M54.9

## 2016-05-09 LAB — COMPREHENSIVE METABOLIC PANEL
ALBUMIN: 4.4 g/dL (ref 3.5–5.0)
ALT: 22 U/L (ref 17–63)
ANION GAP: 5 (ref 5–15)
AST: 22 U/L (ref 15–41)
Alkaline Phosphatase: 57 U/L (ref 38–126)
BUN: 14 mg/dL (ref 6–20)
CO2: 30 mmol/L (ref 22–32)
Calcium: 8.6 mg/dL — ABNORMAL LOW (ref 8.9–10.3)
Chloride: 102 mmol/L (ref 101–111)
Creatinine, Ser: 0.94 mg/dL (ref 0.61–1.24)
GFR calc Af Amer: >60 mL/min (ref >60)
GFR calc non Af Amer: >60 mL/min (ref >60)
GLUCOSE: 108 mg/dL — AB (ref 65–99)
POTASSIUM: 3.7 mmol/L (ref 3.5–5.1)
SODIUM: 137 mmol/L (ref 135–145)
TOTAL PROTEIN: 6.9 g/dL (ref 6.5–8.1)
Total Bilirubin: 0.4 mg/dL (ref 0.3–1.2)

## 2016-05-09 LAB — CBC
HEMATOCRIT: 43.7 % (ref 40.0–52.0)
HEMOGLOBIN: 15.1 g/dL (ref 13.0–18.0)
MCH: 29.6 pg (ref 26.0–34.0)
MCHC: 34.6 g/dL (ref 32.0–36.0)
MCV: 85.4 fL (ref 80.0–100.0)
Platelets: 182 10*3/uL (ref 150–440)
RBC: 5.12 MIL/uL (ref 4.40–5.90)
RDW: 13.4 % (ref 11.5–14.5)
WBC: 7.8 10*3/uL (ref 3.8–10.6)

## 2016-05-09 LAB — ETHANOL: Alcohol, Ethyl (B): <5 mg/dL (ref <5)

## 2016-05-09 LAB — SALICYLATE LEVEL

## 2016-05-09 LAB — ACETAMINOPHEN LEVEL

## 2016-05-09 NOTE — ED Provider Notes (Addendum)
[facility] Emergency Department Provider Note  ____________________________________________   I have reviewed the triage vital signs and the nursing notes.   HISTORY  Chief Complaint I had a panic attack   HPI Patrick Huffman is a 26 y.o. male  states he had a panic attack. Patient gave plasma today, , did not eat any food, and then take Percocet. Patient had no SI or HI, he states he had chronic back pain and he needed his Percocet. He took 4 or 510/325 Percocets for his back pain. He states it happened over 3 or 4 hours. He states that he had no thought of hurting himself or anyone else and this is not unusual for him if he is having severe pain to take an extra Percocet or 2. He does not feel he has any addiction issues he does not want any counseling about this. He states after he took it he came a little bit lightheaded and then he states he had a panic attack which was a feeling of great anxiety. All of that passed when he came to the waiting room and now he would like to go home. His wife endorses that he is not suicidal. He does not take extra Percocet every day and he cannot recall the last time he did.    Past Medical History:  Diagnosis Date  . Back pain   . Heart murmur   . Hernia, inguinal     There are no active problems to display for this patient.   History reviewed. No pertinent surgical history.  Prior to Admission medications   Medication Sig Start Date End Date Taking? Authorizing Provider  oxyCODONE-acetaminophen (PERCOCET) 10-325 MG tablet Take 2 tablets by mouth every 6 (six) hours as needed for pain.   Yes Historical Provider, MD  brompheniramine-pseudoephedrine-DM 30-2-10 MG/5ML syrup Take 10 mLs by mouth 4 (four) times daily as needed. 01/27/16   Delorise Royals Cuthriell, PA-C  cetirizine (ZYRTEC) 10 MG tablet Take 1 tablet (10 mg total) by mouth daily. 01/27/16   Delorise Royals Cuthriell, PA-C  fluticasone (FLONASE) 50 MCG/ACT nasal  spray Place 1 spray into both nostrils 2 (two) times daily. 01/27/16   Delorise Royals Cuthriell, PA-C  promethazine (PHENERGAN) 25 MG tablet Take 1 tablet (25 mg total) by mouth every 6 (six) hours as needed for nausea or vomiting. 05/07/16   Evangeline Dakin, PA-C    Allergies Prednisone  History reviewed. No pertinent family history.  Social History Social History  Substance Use Topics  . Smoking status: Current Every Day Smoker  . Smokeless tobacco: Never Used  . Alcohol use No    Review of Systems Constitutional: No fever/chills Eyes: No visual changes. ENT: No sore throat. No stiff neck no neck pain Cardiovascular: Denies chest pain. Respiratory: Denies shortness of breath. Gastrointestinal:   no vomiting.  No diarrhea.  No constipation. Genitourinary: Negative for dysuria. Musculoskeletal: Negative lower extremity swelling Skin: Negative for rash. Neurological: Negative for severe headaches, focal weakness or numbness. 10-point ROS otherwise negative.  ____________________________________________   PHYSICAL EXAM:  VITAL SIGNS: ED Triage Vitals  Enc Vitals Group     BP 05/09/16 0416 116/66     Pulse Rate 05/09/16 0416 73     Resp 05/09/16 0416 16     Temp 05/09/16 0416 98.8 F (37.1 C)     Temp Source 05/09/16 0416 Oral     SpO2 05/09/16 0416 98 %     Weight 05/09/16 0417 180 lb (  81.6 kg)     Height 05/09/16 0417 6' (1.829 m)     Head Circumference --      Peak Flow --      Pain Score 05/09/16 0458 0     Pain Loc --      Pain Edu? --      Excl. in GC? --     Constitutional: Alert and oriented. Well appearing and in no acute distress. Eyes: Conjunctivae are normal. PERRL. EOMI. Head: Atraumatic. Nose: No congestion/rhinnorhea. Mouth/Throat: Mucous membranes are moist.  Oropharynx non-erythematous. Neck: No stridor.   Nontender with no meningismus Cardiovascular: Normal rate, regular rhythm. Grossly normal heart sounds.  Good peripheral  circulation. Respiratory: Normal respiratory effort.  No retractions. Lungs CTAB. Abdominal: Soft and nontender. No distention. No guarding no rebound Back:  There is no focal tenderness or step off.  there is no midline tenderness there are no lesions noted. there is no CVA tenderness Musculoskeletal: No lower extremity tenderness, no upper extremity tenderness. No joint effusions, no DVT signs strong distal pulses no edema Neurologic:  Normal speech and language. No gross focal neurologic deficits are appreciated.  Skin:  Skin is warm, dry and intact. No rash noted. Psychiatric: Mood and affect are normal. Speech and behavior are normal.  ____________________________________________   LABS (all labs ordered are listed, but only abnormal results are displayed)  Labs Reviewed  CBC  COMPREHENSIVE METABOLIC PANEL  ETHANOL  SALICYLATE LEVEL  ACETAMINOPHEN LEVEL  URINE DRUG SCREEN, QUALITATIVE ([facility] ONLY)  CBG MONITORING, ED   ____________________________________________  EKG  I personally interpreted any EKGs ordered by me or triage Normal sinus rhythm borderline short PR interval RSR prime configuration, no acute ischemic changes normal axis rate is 74 ____________________________________________  RADIOLOGY  I reviewed any imaging ordered by me or triage that were performed during my shift and, if possible, patient and/or family made aware of any abnormal findings. ____________________________________________   PROCEDURES  Procedure(s) performed: None  Procedures  Critical Care performed: None  ____________________________________________   INITIAL IMPRESSION / ASSESSMENT AND PLAN / ED COURSE  Pertinent labs & imaging results that were available during my care of the patient were reviewed by me and considered in my medical decision making (see chart for details).  Patient did take more Percocet than Strickler prescribed today however he had no intent to harm himself  and does not meet criteria for involuntary commitment. Patient had what he describes as a panic attack. He states he just gets very anxious. He has had these attacks for years.At this time, there does not appear to be clinical evidence to support the diagnosis of pulmonary embolus, dissection, myocarditis, endocarditis, pericarditis, pericardial tamponade, acute coronary syndrome, pneumothorax, pneumonia, or any other acute intrathoracic pathology that will require admission or acute intervention. Nor is there evidence of any significant intra-abdominal pathology causing this constellation of symptoms. I have talked to the patient extensively about judicious use of medication in the risk of overdose, as well as the risk of narcotic addiction and he states that he will be more vigilant about how many medications he takes and he feels it is essentially impossible for him to become addicted because he has will power. He does not wish any counseling. We will check liver function tests and basic blood work to ensure that there is no occult overdose or evidence of liver injury from chronic arcus at ingestion however if these are negative he will be at this time clear to go home with outpatient  follow-up. Patient may elect to go home to matter what I find given what he is saying that we have induced him to agree to at least let me work him up a little bit to make sure that there is no evidence of other pathology noted.  ----------------------------------------- 5:47 AM on 05/09/2016 -----------------------------------------  No evidence of acute or chronic Tylenol overdose or liver damage. Patient requesting discharge, we will discharge him at this time he is in no acute distress. I have strongly counseled him against excessive medication ingestion and extensive instructions given about the need to eat when he donates plasma, etc. patient and family once again the test to the absence of homicidal or suicidal or  self-harm ideation   ____________________________________________   FINAL CLINICAL IMPRESSION(S) / ED DIAGNOSES  Final diagnoses:  None      This chart was dictated using voice recognition software.  Despite best efforts to proofread,  errors can occur which can change meaning.      Jeanmarie Plant, MD 05/09/16 4098    Jeanmarie Plant, MD 05/09/16 1191    Jeanmarie Plant, MD 05/09/16 [phone]

## 2016-05-09 NOTE — ED Notes (Signed)
Patient going home with wife.

## 2016-05-09 NOTE — ED Triage Notes (Signed)
Pt rx'd Percocet 10-325 for back pain x 5 days ago. Pt states between 3 and 5 hrs ago he took 5 of these tablets for his back pain because it was not "helping" as it was rx'd. Pt states he started to "feel funny" and decrribed shortness of breath. Pt states both of these things have resolved at this time. Pt is A &O x 4 at this time and ambulatory.

## 2016-05-09 NOTE — Discharge Instructions (Signed)
Do not take medication in any way that is not prescribed, especially medication such as Percocet as it can cause significant liver damage or overdose. The very careful about chronic use of narcotic pain medication as it is not good for you. It can lead to addiction. Return to the emergency room if you have any new or worrisome symptoms. If you do donate plasma again in the future, be sure to eat and drink appropriately.

## 2016-05-09 NOTE — ED Notes (Signed)
Pt. Did not want an IV, this nurse told patient you may have to be stuck again.  Pt. States "I would rather get stuck twice if needed".

## 2016-05-09 NOTE — ED Notes (Signed)
Pt. States he was prescribed 10-325 percocet's for back pain.  Pt. States he was not feeling better with what was prescribed.  Patient states he took five 10-325 in a three hour period from 11 pm last night to midnight.  Pt. States he gave plasma this afternoon around 5 pm.

## 2016-05-09 NOTE — ED Notes (Signed)
Pt. States he will press call bell when ready to give urine specimen.

## 2016-05-18 ENCOUNTER — Emergency Department
Admission: EM | Admit: 2016-05-18 | Discharge: 2016-05-18 | Disposition: A | Discharge status: Home / Self Care | Payer: Self-pay | Attending: Emergency Medicine | Admitting: Emergency Medicine

## 2016-05-18 DIAGNOSIS — M545 Low back pain, unspecified: Secondary | ICD-10-CM

## 2016-05-18 DIAGNOSIS — F17200 Nicotine dependence, unspecified, uncomplicated: Secondary | ICD-10-CM | POA: Insufficient documentation

## 2016-05-18 MED ORDER — MELOXICAM 15 MG PO TABS: 15.0000 mg | ORAL_TABLET | Freq: Every day | ORAL | 0 refills | Status: DC

## 2016-05-18 NOTE — ED Triage Notes (Signed)
Pt states that today he started with back pain - denies urinary issues - he reports pain increases with walking

## 2016-05-18 NOTE — ED Provider Notes (Signed)
[facility] Emergency Department Provider Note ____________________________________________  Time seen: Approximately 7:28 PM  I have reviewed the triage vital signs and the nursing notes.   HISTORY  Chief Complaint Back Pain    HPI YANDRIEL BOENING is a 26 y.o. male presents to the emergency department for evaluation of right-sided lower back pain. He states upon awakening this morning, he was unable to get out of bed due to pain. He denies recent injury. He has a history of back pain in similar locations. He states that he just rested all day and the pain has subsided as long as he lay still. Pain returns with any movement or ambulation.  Past Medical History:  Diagnosis Date  . Back pain   . Heart murmur   . Hernia, inguinal     There are no active problems to display for this patient.   History reviewed. No pertinent surgical history.  Prior to Admission medications   Medication Sig Start Date End Date Taking? Authorizing Provider  brompheniramine-pseudoephedrine-DM 30-2-10 MG/5ML syrup Take 10 mLs by mouth 4 (four) times daily as needed. 01/27/16   Delorise Royals Cuthriell, PA-C  cetirizine (ZYRTEC) 10 MG tablet Take 1 tablet (10 mg total) by mouth daily. 01/27/16   Delorise Royals Cuthriell, PA-C  fluticasone (FLONASE) 50 MCG/ACT nasal spray Place 1 spray into both nostrils 2 (two) times daily. 01/27/16   Delorise Royals Cuthriell, PA-C  meloxicam (MOBIC) 15 MG tablet Take 1 tablet (15 mg total) by mouth daily. 05/18/16   Chinita Pester, FNP  oxyCODONE-acetaminophen (PERCOCET) 10-325 MG tablet Take 2 tablets by mouth every 6 (six) hours as needed for pain.    Historical Provider, MD  promethazine (PHENERGAN) 25 MG tablet Take 1 tablet (25 mg total) by mouth every 6 (six) hours as needed for nausea or vomiting. 05/07/16   Evangeline Dakin, PA-C    Allergies Prednisone  No family history on file.  Social History Social History  Substance Use Topics  .  Smoking status: Current Every Day Smoker  . Smokeless tobacco: Never Used  . Alcohol use No    Review of Systems Constitutional: No recent illness. Cardiovascular: Denies chest pain or palpitations. Respiratory: Denies shortness of breath. Musculoskeletal: Pain in Right lower back Skin: Negative for rash, wound, lesion. Neurological: Negative for focal weakness or numbness.  ____________________________________________   PHYSICAL EXAM:  VITAL SIGNS: ED Triage Vitals [05/18/16 1822]  Enc Vitals Group     BP 122/74     Pulse Rate 97     Resp 16     Temp 97.2 F (36.2 C)     Temp Source Oral     SpO2 97 %     Weight 180 lb (81.6 kg)     Height 6' (1.829 m)     Head Circumference      Peak Flow      Pain Score 8     Pain Loc      Pain Edu?      Excl. in GC?     Constitutional: Alert and oriented. Well appearing and in no acute distress. Eyes: Conjunctivae are normal. EOMI. Head: Atraumatic. Neck: No stridor.  Respiratory: Normal respiratory effort.   Musculoskeletal: Active straight leg raise bilaterally without assistance and without pain. Observed ambulating without assistance and with a steady gait. No CVA tenderness. Neurologic:  Normal speech and language. No gross focal neurologic deficits are appreciated. Speech is normal. No gait instability. Skin:  Skin is warm,  dry and intact. Atraumatic. Psychiatric: Mood and affect are normal. Speech and behavior are normal.  ____________________________________________   LABS (all labs ordered are listed, but only abnormal results are displayed)  Labs Reviewed - No data to display ____________________________________________  RADIOLOGY  Not indicated ____________________________________________   PROCEDURES  Procedure(s) performed: None   ____________________________________________   INITIAL IMPRESSION / ASSESSMENT AND PLAN / ED COURSE     Pertinent labs & imaging results that were available during  my care of the patient were reviewed by me and considered in my medical decision making (see chart for details).  26 year old male presenting to the emergency department for evaluation of right-sided lower back pain. He is given a prescription for meloxicam and advised to follow-up with the orthopedist. He is instructed to return to the ER for symptoms that change or worsen if he is unable schedule an appointment. ____________________________________________   FINAL CLINICAL IMPRESSION(S) / ED DIAGNOSES  Final diagnoses:  Acute right-sided low back pain without sciatica       Chinita PesterCari B Roneisha Stern, FNP 05/18/16 2018    Phineas SemenGraydon Goodman, MD 05/18/16 2033

## 2016-05-18 NOTE — Discharge Instructions (Signed)
Follow up with orthopedics. Return to the ER for symptoms that change or worsen if you are unable to schedule an appointment. °

## 2016-09-25 ENCOUNTER — Emergency Department
Admission: EM | Admit: 2016-09-25 | Discharge: 2016-09-25 | Discharge status: Against Medical Advice | Payer: Self-pay | Attending: Emergency Medicine | Admitting: Emergency Medicine

## 2016-09-25 DIAGNOSIS — Y999 Unspecified external cause status: Secondary | ICD-10-CM | POA: Insufficient documentation

## 2016-09-25 DIAGNOSIS — W57XXXA Bitten or stung by nonvenomous insect and other nonvenomous arthropods, initial encounter: Secondary | ICD-10-CM | POA: Insufficient documentation

## 2016-09-25 DIAGNOSIS — Y939 Activity, unspecified: Secondary | ICD-10-CM | POA: Insufficient documentation

## 2016-09-25 DIAGNOSIS — Y929 Unspecified place or not applicable: Secondary | ICD-10-CM | POA: Insufficient documentation

## 2016-09-25 DIAGNOSIS — S90861A Insect bite (nonvenomous), right foot, initial encounter: Secondary | ICD-10-CM | POA: Insufficient documentation

## 2016-09-25 DIAGNOSIS — Z5321 Procedure and treatment not carried out due to patient leaving prior to being seen by health care provider: Secondary | ICD-10-CM | POA: Insufficient documentation

## 2016-09-25 NOTE — ED Triage Notes (Signed)
Pt states that he felt a stinging sensation on his R foot, when he went to hit his leg, he smashed a spider.  Pt believes that he was bit by spider.  Pt brought dead spider with him in small clear case.  Pt states that he is having pressure up his entire R leg.  Pt also states that after getting bitten he felt hot and sweaty all over and then started having chills.  Pt is A&Ox4, pt in NAD, pt ambulatory to triage.

## 2016-10-21 ENCOUNTER — Encounter: Payer: Self-pay | Admitting: Emergency Medicine

## 2016-10-21 ENCOUNTER — Emergency Department
Admission: EM | Admit: 2016-10-21 | Discharge: 2016-10-21 | Disposition: A | Discharge status: Home / Self Care | Payer: Self-pay | Attending: Emergency Medicine | Admitting: Emergency Medicine

## 2016-10-21 DIAGNOSIS — Z79899 Other long term (current) drug therapy: Secondary | ICD-10-CM | POA: Insufficient documentation

## 2016-10-21 DIAGNOSIS — F17200 Nicotine dependence, unspecified, uncomplicated: Secondary | ICD-10-CM | POA: Insufficient documentation

## 2016-10-21 DIAGNOSIS — R234 Changes in skin texture: Secondary | ICD-10-CM | POA: Insufficient documentation

## 2016-10-21 NOTE — ED Notes (Signed)
Pt states while sitting on couch watching tv approx 0030, blood started pouring down pt face. States noticed puncture mark no larger than size of pores. Pt denies injury, denies bite. Pt states feeling lethargic.

## 2016-10-21 NOTE — Discharge Instructions (Signed)
You were seen for a tiny area on your forehead which was bleeding. You may remove Band-Aid when you get home from work tonight. Return to the ER for recurrent or worsening symptoms, persistent vomiting, difficulty breathing or other concerns.

## 2016-10-21 NOTE — ED Triage Notes (Signed)
Pt arrived with significant other with complaints of persistant bleeding from punctured area above left eyebrow. Pt denies any known injury. Pt states he was sitting on the couch and blood began to drip from area above his eyebrow. Pt reports attempting to stop bleeding with pressure but had no success for 1 hour. Pressure was applied in triage and bleeding is contained.

## 2016-10-21 NOTE — ED Notes (Signed)
Bandage in place from triage. No drainage present through bandage at time of assessment,.

## 2016-10-21 NOTE — ED Provider Notes (Signed)
[facility] Emergency Department Provider Note   ____________________________________________   First MD Initiated Contact with Patient 10/21/16 [phone]     (approximate)  I have reviewed the triage vital signs and the nursing notes.   HISTORY  Chief Complaint Puncture Wound    HPI Patrick Huffman is a 26 y.o. male who presents to the ED from home with a chief complaint of bleeding from his face. Patient reports he was just sitting on the couch when he noticed blood dripping from above his eyebrow. Denies trauma or injury. 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 Attempted to stop the bleeding with pressure but the area was still bleeding after an hour so he proceeded to the ER. Pressure applied in triage and bleeding resolved. Denies associated pain, chest pain, shortness breath, abdominal pain, nausea, vomiting. Denies recent travel or trauma. Direct pressure resolved bleeding.    Past Medical History:  Diagnosis Date  . Back pain   . Heart murmur   . Hernia, inguinal     There are no active problems to display for this patient.   History reviewed. No pertinent surgical history.  Prior to Admission medications   Medication Sig Start Date End Date Taking? Authorizing Provider  brompheniramine-pseudoephedrine-DM 30-2-10 MG/5ML syrup Take 10 mLs by mouth 4 (four) times daily as needed. 01/27/16   Cuthriell, Delorise RoyalsJonathan D, PA-C  cetirizine (ZYRTEC) 10 MG tablet Take 1 tablet (10 mg total) by mouth daily. 01/27/16   Cuthriell, Delorise RoyalsJonathan D, PA-C  fluticasone (FLONASE) 50 MCG/ACT nasal spray Place 1 spray into both nostrils 2 (two) times daily. 01/27/16   Cuthriell, Delorise RoyalsJonathan D, PA-C  meloxicam (MOBIC) 15 MG tablet Take 1 tablet (15 mg total) by mouth daily. 05/18/16   Triplett, Rulon Eisenmengerari B, FNP  oxyCODONE-acetaminophen (PERCOCET) 10-325 MG tablet Take 2 tablets by mouth every 6 (six) hours as needed for pain.     [provider]  promethazine (PHENERGAN) 25 MG tablet Take 1 tablet (25 mg total) by mouth every 6 (six) hours as needed for nausea or vomiting. 05/07/16   Beers, Charmayne Sheerharles M, PA-C    Allergies Prednisone  No family history on file.  Social History Social History  Substance Use Topics  . Smoking status: Current Every Day Smoker  . Smokeless tobacco: Never Used  . Alcohol use No    Review of Systems  Constitutional: No fever/chills. Eyes: No visual changes. ENT: Positive for bleeding above left eyebrow. No sore throat. Cardiovascular: Denies chest pain. Respiratory: Denies shortness of breath. Gastrointestinal: No abdominal pain.  No nausea, no vomiting.  No diarrhea.  No constipation. Genitourinary: Negative for dysuria. Musculoskeletal: Negative for back pain. Skin: Negative for rash. Neurological: Negative for headaches, focal weakness or numbness.   ____________________________________________   PHYSICAL EXAM:  VITAL SIGNS: ED Triage Vitals  Enc Vitals Group     BP 10/21/16 0114 134/87     Pulse Rate 10/21/16 0114 66     Resp 10/21/16 0114 18     Temp 10/21/16 0114 98.4 F (36.9 C)     Temp Source 10/21/16 0114 Oral     SpO2 10/21/16 0114 100 %     Weight 10/21/16 0114 185 lb (83.9 kg)     Height 10/21/16 0114 6' (1.829 m)     Head Circumference --      Peak Flow --      Pain Score 10/21/16 0156 8     Pain Loc --  Pain Edu? --      Excl. in GC? --     Constitutional: Alert and oriented. Well appearing and in no acute distress. Eyes: Conjunctivae are normal. PERRL. EOMI. Head: Atraumatic. No active bleeding. Pinpoint area above left eyebrow or patient noted bleeding. Appears to have had a scab that was rubbed off. Currently no bleeding. Nose: No congestion/rhinnorhea. Mouth/Throat: Mucous membranes are moist.  Oropharynx non-erythematous. Neck: No stridor.   Cardiovascular: Normal rate, regular rhythm. Grossly normal heart sounds.  Good  peripheral circulation. Respiratory: Normal respiratory effort.  No retractions. Lungs CTAB. Gastrointestinal: Soft and nontender. No distention. No abdominal bruits. No CVA tenderness. Musculoskeletal: No lower extremity tenderness nor edema.  No joint effusions. Neurologic:  Normal speech and language. No gross focal neurologic deficits are appreciated. No gait instability. Skin:  Skin is warm, dry and intact. No rash noted. Psychiatric: Mood and affect are normal. Speech and behavior are normal.  ____________________________________________   LABS (all labs ordered are listed, but only abnormal results are displayed)  Labs Reviewed - No data to display ____________________________________________  EKG  None ____________________________________________  RADIOLOGY  No results found.  ____________________________________________   PROCEDURES  Procedure(s) performed: None  Procedures  Critical Care performed: No  ____________________________________________   INITIAL IMPRESSION / ASSESSMENT AND PLAN / ED COURSE  Pertinent labs & imaging results that were available during my care of the patient were reviewed by me and considered in my medical decision making (see chart for details).  26 year old male who presents with facial bleeding that is currently not bleeding. Denies trauma or injury. Clinical appearance consistent with tiny scab which was scratched off with resultant bleeding. Since patient is going to work this morning and constantly wipes his brow, will place Surgicel and Band-Aid over affected area so does not rebleed. Strict return precautions given. Patient and spouse verbalize understanding and agree with plan of care.      ____________________________________________   FINAL CLINICAL IMPRESSION(S) / ED DIAGNOSES  Final diagnoses:  Scab      NEW MEDICATIONS STARTED DURING THIS VISIT:  New Prescriptions   No medications on file     Note:   This document was prepared using Dragon voice recognition software and may include unintentional dictation errors.    Irean Hong, MD 10/21/16 [phone]

## 2017-02-15 ENCOUNTER — Other Ambulatory Visit: Payer: Self-pay

## 2017-02-15 ENCOUNTER — Emergency Department
Admission: EM | Admit: 2017-02-15 | Discharge: 2017-02-15 | Disposition: A | Discharge status: Home / Self Care | Payer: Self-pay | Attending: Emergency Medicine | Admitting: Emergency Medicine

## 2017-02-15 ENCOUNTER — Encounter: Payer: Self-pay | Admitting: Emergency Medicine

## 2017-02-15 DIAGNOSIS — J069 Acute upper respiratory infection, unspecified: Secondary | ICD-10-CM | POA: Insufficient documentation

## 2017-02-15 DIAGNOSIS — Z79899 Other long term (current) drug therapy: Secondary | ICD-10-CM | POA: Insufficient documentation

## 2017-02-15 DIAGNOSIS — R05 Cough: Secondary | ICD-10-CM | POA: Insufficient documentation

## 2017-02-15 DIAGNOSIS — R0981 Nasal congestion: Secondary | ICD-10-CM | POA: Insufficient documentation

## 2017-02-15 DIAGNOSIS — R51 Headache: Secondary | ICD-10-CM | POA: Insufficient documentation

## 2017-02-15 DIAGNOSIS — F17210 Nicotine dependence, cigarettes, uncomplicated: Secondary | ICD-10-CM | POA: Insufficient documentation

## 2017-02-15 MED ORDER — ALBUTEROL SULFATE HFA 108 (90 BASE) MCG/ACT IN AERS: 2.0000 | INHALATION_SPRAY | Freq: Four times a day (QID) | RESPIRATORY_TRACT | 0 refills | Status: DC | PRN

## 2017-02-15 MED ORDER — FLUTICASONE PROPIONATE 50 MCG/ACT NA SUSP: 2.0000 | Freq: Every day | NASAL | 0 refills | Status: DC

## 2017-02-15 MED ORDER — BENZONATATE 100 MG PO CAPS: 100.0000 mg | ORAL_CAPSULE | Freq: Three times a day (TID) | ORAL | 0 refills | Status: AC | PRN

## 2017-02-15 NOTE — ED Triage Notes (Signed)
Sore throat x 3 days, cough now and headache.

## 2017-02-15 NOTE — ED Provider Notes (Signed)
[facility] Emergency Department Provider Note  ____________________________________________  Time seen: Approximately 4:48 PM  I have reviewed the triage vital signs and the nursing notes.   HISTORY  Chief Complaint Sore Throat and Cough    HPI [name] is a 26 y.o. male that presents to the emergency department for evaluation of headache, sore throat, nasal congestion, nonproductive cough for 3 days. Sore throat and congestion have improved. Now, he only has a nonproductive cough. Patient smokes a half a pack of cigarettes per day.  He has been taking Tylenol for symptoms.  He denies sick contacts. He gets a rash when he takes prednisone.  No fever, chills, SOB, CP, nausea, vomiting, abdominal pain.   Past Medical History:  Diagnosis Date  . Back pain   . Heart murmur   . Hernia, inguinal     There are no active problems to display for this patient.   History reviewed. No pertinent surgical history.  Prior to Admission medications   Medication Sig Start Date End Date Taking? Authorizing Provider  albuterol (PROVENTIL HFA;VENTOLIN HFA) 108 (90 Base) MCG/ACT inhaler Inhale 2 puffs every 6 (six) hours as needed into the lungs for wheezing or shortness of breath. 02/15/17   Enid Derry, PA-C  benzonatate (TESSALON PERLES) 100 MG capsule Take 1 capsule (100 mg total) 3 (three) times daily as needed by mouth for cough. 02/15/17 02/15/18  Enid Derry, PA-C  brompheniramine-pseudoephedrine-DM 30-2-10 MG/5ML syrup Take 10 mLs by mouth 4 (four) times daily as needed. 01/27/16   Cuthriell, Delorise Royals, PA-C  cetirizine (ZYRTEC) 10 MG tablet Take 1 tablet (10 mg total) by mouth daily. 01/27/16   Cuthriell, Delorise Royals, PA-C  fluticasone (FLONASE) 50 MCG/ACT nasal spray Place 2 sprays daily into both nostrils. 02/15/17 02/15/18  Enid Derry, PA-C  meloxicam (MOBIC) 15 MG tablet Take 1 tablet (15 mg total) by mouth daily. 05/18/16   Triplett, Rulon Eisenmenger B, FNP   oxyCODONE-acetaminophen (PERCOCET) 10-325 MG tablet Take 2 tablets by mouth every 6 (six) hours as needed for pain.    [provider]  promethazine (PHENERGAN) 25 MG tablet Take 1 tablet (25 mg total) by mouth every 6 (six) hours as needed for nausea or vomiting. 05/07/16   Beers, Charmayne Sheer, PA-C    Allergies Prednisone  No family history on file.  Social History Social History   Tobacco Use  . Smoking status: Current Every Day Smoker    Packs/day: 0.50    Types: Cigarettes  . Smokeless tobacco: Never Used  Substance Use Topics  . Alcohol use: No  . Drug use: No     Review of Systems  Constitutional: No fever/chills Eyes: No visual changes. No discharge. ENT: Positive for congestion and rhinorrhea. Cardiovascular: No chest pain. Respiratory: Positive for cough. No SOB. Gastrointestinal: No abdominal pain.  No nausea, no vomiting.  No diarrhea.  No constipation. Musculoskeletal: Negative for musculoskeletal pain. Skin: Negative for rash, abrasions, lacerations, ecchymosis.   ____________________________________________   PHYSICAL EXAM:  VITAL SIGNS: ED Triage Vitals  Enc Vitals Group     BP 02/15/17 1637 135/74     Pulse Rate 02/15/17 1637 (!) 104     Resp 02/15/17 1637 20     Temp 02/15/17 1637 98.3 F (36.8 C)     Temp Source 02/15/17 1637 Oral     SpO2 02/15/17 1637 99 %     Weight 02/15/17 1638 187 lb (84.8 kg)     Height 02/15/17 1638 6' (1.829  m)     Head Circumference --      Peak Flow --      Pain Score 02/15/17 1637 7     Pain Loc --      Pain Edu? --      Excl. in GC? --      Constitutional: Alert and oriented. Well appearing and in no acute distress. Eyes: Conjunctivae are normal. PERRL. EOMI. No discharge. Head: Atraumatic. ENT: No frontal and maxillary sinus tenderness.      Ears: Tympanic membranes pearly gray with good landmarks. No discharge.      Nose: Mild congestion/rhinnorhea.      Mouth/Throat: Mucous membranes are  moist. Oropharynx non-erythematous. Tonsils not enlarged. No exudates. Uvula midline. Neck: No stridor.   Hematological/Lymphatic/Immunilogical: No cervical lymphadenopathy. Cardiovascular: Normal rate, regular rhythm.  Good peripheral circulation. Respiratory: Normal respiratory effort without tachypnea or retractions. Lungs CTAB. Good air entry to the bases with no decreased or absent breath sounds. Gastrointestinal: Bowel sounds 4 quadrants. Soft and nontender to palpation. No guarding or rigidity. No palpable masses. No distention. Musculoskeletal: Full range of motion to all extremities. No gross deformities appreciated. Neurologic:  Normal speech and language. No gross focal neurologic deficits are appreciated.  Skin:  Skin is warm, dry and intact. No rash noted. Psychiatric: Mood and affect are normal. Speech and behavior are normal. Patient exhibits appropriate insight and judgement.   ____________________________________________   LABS (all labs ordered are listed, but only abnormal results are displayed)  Labs Reviewed - No data to display ____________________________________________  EKG   ____________________________________________  RADIOLOGY  No results found.  ____________________________________________    PROCEDURES  Procedure(s) performed:    Procedures    Medications - No data to display   ____________________________________________   INITIAL IMPRESSION / ASSESSMENT AND PLAN / ED COURSE  Pertinent labs & imaging results that were available during my care of the patient were reviewed by me and considered in my medical decision making (see chart for details).  Review of the [place] CSRS was performed in accordance of the [facility] prior to dispensing any controlled drugs.     Patient's diagnosis is consistent with upper respiratory infection. Vital signs and exam are reassuring. Symptoms are consistent with a viral infection. Patient appears well and  is staying well hydrated. Patient should alternate tylenol and ibuprofen for fever. Patient feels comfortable going home. Patient will be discharged home with prescriptions for tessalon perles, flonase, albuterol. Patient is to follow up with PCP as needed or otherwise directed. Patient is given ED precautions to return to the ED for any worsening or new symptoms.     ____________________________________________  FINAL CLINICAL IMPRESSION(S) / ED DIAGNOSES  Final diagnoses:  Viral upper respiratory tract infection      NEW MEDICATIONS STARTED DURING THIS VISIT:  This SmartLink is deprecated. Use AVSMEDLIST instead to display the medication list for a patient.      This chart was dictated using voice recognition software/Dragon. Despite best efforts to proofread, errors can occur which can change the meaning. Any change was purely unintentional.    Enid DerryWagner, Eon Zunker, PA-C 02/15/17 1853    Loleta RoseForbach, Cory, MD 02/15/17 [phone]

## 2017-11-24 ENCOUNTER — Emergency Department: Payer: Self-pay

## 2017-11-24 ENCOUNTER — Encounter: Payer: Self-pay | Admitting: *Deleted

## 2017-11-24 ENCOUNTER — Other Ambulatory Visit: Payer: Self-pay

## 2017-11-24 ENCOUNTER — Emergency Department
Admission: EM | Admit: 2017-11-24 | Discharge: 2017-11-24 | Disposition: A | Discharge status: Home / Self Care | Payer: Self-pay | Attending: Student in an Organized Health Care Education/Training Program | Admitting: Student in an Organized Health Care Education/Training Program

## 2017-11-24 DIAGNOSIS — X509XXA Other and unspecified overexertion or strenuous movements or postures, initial encounter: Secondary | ICD-10-CM | POA: Insufficient documentation

## 2017-11-24 DIAGNOSIS — Z79899 Other long term (current) drug therapy: Secondary | ICD-10-CM | POA: Insufficient documentation

## 2017-11-24 DIAGNOSIS — Y9301 Activity, walking, marching and hiking: Secondary | ICD-10-CM | POA: Insufficient documentation

## 2017-11-24 DIAGNOSIS — F17210 Nicotine dependence, cigarettes, uncomplicated: Secondary | ICD-10-CM | POA: Insufficient documentation

## 2017-11-24 DIAGNOSIS — S93401A Sprain of unspecified ligament of right ankle, initial encounter: Secondary | ICD-10-CM | POA: Insufficient documentation

## 2017-11-24 DIAGNOSIS — Y92480 Sidewalk as the place of occurrence of the external cause: Secondary | ICD-10-CM | POA: Insufficient documentation

## 2017-11-24 DIAGNOSIS — Y998 Other external cause status: Secondary | ICD-10-CM | POA: Insufficient documentation

## 2017-11-24 MED ORDER — MELOXICAM 15 MG PO TABS: 15.0000 mg | ORAL_TABLET | Freq: Every day | ORAL | 0 refills | Status: DC

## 2017-11-24 NOTE — ED Triage Notes (Signed)
Pt presents w/ swollen R ankle. Pt states he was at work when he twisted his ankle. 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 Pt has taken nothing for pain. Pt states he has iced and elevated leg w/o relief.

## 2017-11-24 NOTE — ED Provider Notes (Signed)
[facility] Emergency Department Provider Note  ____________________________________________  Time seen: Approximately 3:45 PM  I have reviewed the triage vital signs and the nursing notes.   HISTORY  Chief Complaint Ankle Injury    HPI Patrick Huffman is a 27 y.o. male who presents emergency department complaining of right ankle pain/injury.  Patient reports that he was at work, stepped off a curb wrong and "twisted" his ankle.  Patient reports that he has sprained the ankle in the past but symptoms were not this bad.  Patient reports that he had almost immediate swelling.  He is been able to bear weight but doing so increases pain.  Patient did not fall or sustain another injury during this occurrence.  Patient is tried Tylenol and Motrin with no relief.  No other complaints at this time.    Past Medical History:  Diagnosis Date  . Back pain   . Heart murmur   . Hernia, inguinal     There are no active problems to display for this patient.   History reviewed. No pertinent surgical history.  Prior to Admission medications   Medication Sig Start Date End Date Taking? Authorizing Provider  albuterol (PROVENTIL HFA;VENTOLIN HFA) 108 (90 Base) MCG/ACT inhaler Inhale 2 puffs every 6 (six) hours as needed into the lungs for wheezing or shortness of breath. 02/15/17   Enid DerryWagner, Ashley, PA-C  benzonatate (TESSALON PERLES) 100 MG capsule Take 1 capsule (100 mg total) 3 (three) times daily as needed by mouth for cough. 02/15/17 02/15/18  Enid DerryWagner, Ashley, PA-C  brompheniramine-pseudoephedrine-DM 30-2-10 MG/5ML syrup Take 10 mLs by mouth 4 (four) times daily as needed. 01/27/16   Dwight Burdo, Delorise RoyalsJonathan D, PA-C  cetirizine (ZYRTEC) 10 MG tablet Take 1 tablet (10 mg total) by mouth daily. 01/27/16   Leverne Amrhein, Delorise RoyalsJonathan D, PA-C  fluticasone (FLONASE) 50 MCG/ACT nasal spray Place 2 sprays daily into both nostrils. 02/15/17 02/15/18  Enid DerryWagner, Ashley, PA-C  meloxicam (MOBIC) 15 MG  tablet Take 1 tablet (15 mg total) by mouth daily. 11/24/17   Even Budlong, Delorise RoyalsJonathan D, PA-C  oxyCODONE-acetaminophen (PERCOCET) 10-325 MG tablet Take 2 tablets by mouth every 6 (six) hours as needed for pain.    [provider]  promethazine (PHENERGAN) 25 MG tablet Take 1 tablet (25 mg total) by mouth every 6 (six) hours as needed for nausea or vomiting. 05/07/16   Beers, Charmayne Sheerharles M, PA-C  loratadine (CLARITIN) 10 MG tablet Take 1 tablet (10 mg total) by mouth daily. 02/19/15 07/29/15  Darci CurrentBrown,  N, MD    Allergies Prednisone  History reviewed. No pertinent family history.  Social History Social History   Tobacco Use  . Smoking status: Current Every Day Smoker    Packs/day: 0.50    Types: Cigarettes  . Smokeless tobacco: Never Used  Substance Use Topics  . Alcohol use: No  . Drug use: No     Review of Systems  Constitutional: No fever/chills Eyes: No visual changes.  Cardiovascular: no chest pain. Respiratory: no cough. No SOB. Gastrointestinal: No abdominal pain.  No nausea, no vomiting.  \Musculoskeletal: Positive for right ankle pain/injury Skin: Negative for rash, abrasions, lacerations, ecchymosis. Neurological: Negative for headaches, focal weakness or numbness. 10-point ROS otherwise negative.  ____________________________________________   PHYSICAL EXAM:  VITAL SIGNS: ED Triage Vitals [11/24/17 1538]  Enc Vitals Group     BP 108/65     Pulse Rate 73     Resp 16     Temp 98.1 F (36.7 C)  Temp Source Oral     SpO2 100 %     Weight 180 lb (81.6 kg)     Height 6' (1.829 m)     Head Circumference      Peak Flow      Pain Score      Pain Loc      Pain Edu?      Excl. in GC?      Constitutional: Alert and oriented. Well appearing and in no acute distress. Eyes: Conjunctivae are normal. PERRL. EOMI. Head: Atraumatic. Neck: No stridor.    Cardiovascular: Normal rate, regular rhythm. Normal S1 and S2.  Good peripheral  circulation. Respiratory: Normal respiratory effort without tachypnea or retractions. Lungs CTAB. Good air entry to the bases with no decreased or absent breath sounds. Musculoskeletal: Full range of motion to all extremities. No gross deformities appreciated.  Mild edema of the right lateral ankle appreciated on exam.  No gross deformity.  Patient has full range of motion to the ankle joint.  Patient is globally tender on palpation of the ankle joint.  No specific point tenderness.  No palpable abnormality or deficit.  Patient is also mildly tender to palpation over the talus and navicular bones.  No palpable abnormality there.  Dorsalis pedis pulse intact.  Sensation intact all 5 digits. Neurologic:  Normal speech and language. No gross focal neurologic deficits are appreciated.  Skin:  Skin is warm, dry and intact. No rash noted. Psychiatric: Mood and affect are normal. Speech and behavior are normal. Patient exhibits appropriate insight and judgement.   ____________________________________________   LABS (all labs ordered are listed, but only abnormal results are displayed)  Labs Reviewed - No data to display ____________________________________________  EKG   ____________________________________________  RADIOLOGY I personally viewed and evaluated these images as part of my medical decision making, as well as reviewing the written report by the radiologist.  Dg Ankle Complete Right  Result Date: 11/24/2017 CLINICAL DATA:  Lateral ankle pain and swelling EXAM: RIGHT ANKLE - COMPLETE 3+ VIEW COMPARISON:  None. FINDINGS: There is no evidence of fracture or dislocation. Small anterior ankle joint effusion. There is no evidence of arthropathy or other focal bone abnormality. Moderate soft tissue swelling over the lateral malleolus. IMPRESSION: Moderate lateral malleolus soft tissue swelling without acute fracture or dislocation. Small anterior ankle joint effusion. Electronically Signed    By: Deatra RobinsonKevin  Herman M.D.   On: 11/24/2017 16:16   Dg Foot Complete Right  Result Date: 11/24/2017 CLINICAL DATA:  Ankle pain and swelling. EXAM: RIGHT FOOT COMPLETE - 3+ VIEW COMPARISON:  None. FINDINGS: There is no evidence of fracture or dislocation. There is no evidence of arthropathy or other focal bone abnormality. Soft tissues are unremarkable. IMPRESSION: No fracture or dislocation of the right foot. Electronically Signed   By: Deatra RobinsonKevin  Herman M.D.   On: 11/24/2017 16:17    ____________________________________________    PROCEDURES  Procedure(s) performed:    Procedures    Medications - No data to display   ____________________________________________   INITIAL IMPRESSION / ASSESSMENT AND PLAN / ED COURSE  Pertinent labs & imaging results that were available during my care of the patient were reviewed by me and considered in my medical decision making (see chart for details).  Review of the [place] CSRS was performed in accordance of the [facility] prior to dispensing any controlled drugs.      Patient's diagnosis is consistent with right ankle sprain.  Patient presents the emergency department one day after  injuring his right ankle.  Exam was overall reassuring.  X-ray reveals no acute osseous abnormality.  Patient is given crutches for ambulation.  He is advised to use an ASO lace up stirrup ankle brace.. Patient will be discharged home with prescriptions for meloxicam for symptom relief. Patient is to follow up with orthopedics as needed or otherwise directed. Patient is given ED precautions to return to the ED for any worsening or new symptoms.     ____________________________________________  FINAL CLINICAL IMPRESSION(S) / ED DIAGNOSES  Final diagnoses:  Sprain of right ankle, unspecified ligament, initial encounter      NEW MEDICATIONS STARTED DURING THIS VISIT:  ED Discharge Orders         Ordered    meloxicam (MOBIC) 15 MG tablet  Daily     11/24/17 1658               This chart was dictated using voice recognition software/Dragon. Despite best efforts to proofread, errors can occur which can change the meaning. Any change was purely unintentional.    Atha, Mcbain, PA-C 11/24/17 1705    Willy Eddy, MD 11/24/17 2000

## 2017-11-24 NOTE — ED Notes (Signed)
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

## 2018-09-13 ENCOUNTER — Other Ambulatory Visit: Payer: Self-pay

## 2018-09-13 ENCOUNTER — Emergency Department
Admission: EM | Admit: 2018-09-13 | Discharge: 2018-09-13 | Disposition: A | Discharge status: Home / Self Care | Payer: Self-pay | Attending: Emergency Medicine | Admitting: Emergency Medicine

## 2018-09-13 DIAGNOSIS — F17210 Nicotine dependence, cigarettes, uncomplicated: Secondary | ICD-10-CM | POA: Insufficient documentation

## 2018-09-13 DIAGNOSIS — R42 Dizziness and giddiness: Secondary | ICD-10-CM | POA: Insufficient documentation

## 2018-09-13 DIAGNOSIS — R0981 Nasal congestion: Secondary | ICD-10-CM | POA: Insufficient documentation

## 2018-09-13 DIAGNOSIS — Z79899 Other long term (current) drug therapy: Secondary | ICD-10-CM | POA: Insufficient documentation

## 2018-09-13 DIAGNOSIS — H9319 Tinnitus, unspecified ear: Secondary | ICD-10-CM | POA: Insufficient documentation

## 2018-09-13 LAB — BASIC METABOLIC PANEL
Anion gap: 7 (ref 5–15)
BUN: 11 mg/dL (ref 6–20)
CO2: 26 mmol/L (ref 22–32)
Calcium: 8.9 mg/dL (ref 8.9–10.3)
Chloride: 105 mmol/L (ref 98–111)
Creatinine, Ser: 0.92 mg/dL (ref 0.61–1.24)
GFR calc Af Amer: >60 mL/min (ref >60)
GFR calc non Af Amer: >60 mL/min (ref >60)
Glucose, Bld: 98 mg/dL (ref 70–99)
Potassium: 3.8 mmol/L (ref 3.5–5.1)
Sodium: 138 mmol/L (ref 135–145)

## 2018-09-13 LAB — CBC
HCT: 44.4 % (ref 39.0–52.0)
Hemoglobin: 14.9 g/dL (ref 13.0–17.0)
MCH: 28.7 pg (ref 26.0–34.0)
MCHC: 33.6 g/dL (ref 30.0–36.0)
MCV: 85.5 fL (ref 80.0–100.0)
Platelets: 226 10*3/uL (ref 150–400)
RBC: 5.19 MIL/uL (ref 4.22–5.81)
RDW: 12.8 % (ref 11.5–15.5)
WBC: 7.7 10*3/uL (ref 4.0–10.5)
nRBC: 0 % (ref 0.0–0.2)

## 2018-09-13 MED ORDER — MECLIZINE HCL 25 MG PO TABS
25.0000 mg | ORAL_TABLET | Freq: Once | ORAL | Status: AC
  Administered 2018-09-13: 25 mg via ORAL
  Filled 2018-09-13: qty 1

## 2018-09-13 MED ORDER — SODIUM CHLORIDE 0.9 % IV BOLUS
1000.0000 mL | Freq: Once | INTRAVENOUS | Status: AC
  Administered 2018-09-13: 1000 mL via INTRAVENOUS

## 2018-09-13 MED ORDER — MECLIZINE HCL 25 MG PO TABS: 25.0000 mg | ORAL_TABLET | Freq: Three times a day (TID) | ORAL | 0 refills | Status: DC | PRN

## 2018-09-13 MED ORDER — LORATADINE 10 MG PO TABS: 10.0000 mg | ORAL_TABLET | Freq: Every day | ORAL | 0 refills | Status: DC

## 2018-09-13 NOTE — ED Notes (Signed)
MD at bedside. 

## 2018-09-13 NOTE — Discharge Instructions (Signed)
Follow-up with a primary care doctor in 1 week.  You can call the numbers above.  It is important to stop drinking as much [facility] as you currently drink.  Try to switch to Gatorade or water.

## 2018-09-13 NOTE — ED Triage Notes (Signed)
Pt c/o when he got up about 2hrs ago having sudden onset dizziness that has not resolved. Denies N/V or other sx

## 2018-09-13 NOTE — ED Notes (Signed)
Pt reports dizziness noted when sitting up in the bed this morning (approx 2 hours ago). Pt denies dizziness or symptoms before going to bed last night. Pt is not diabetic but checked his blood glucose and BP with fathers equipment and reported they were "normal." Dizziness has since subsided after pt drank an orange juice but he reports he continues to feel lightheaded at this time. Pt works [employer] and denies drinking water but only [place]. Dew. No hx of Vertigo.

## 2018-09-13 NOTE — ED Provider Notes (Signed)
[facility] Emergency Department Provider Note  ____________________________________________   First MD Initiated Contact with Patient 09/13/18 1146     (approximate)  I have reviewed the triage vital signs and the nursing notes.   HISTORY  Chief Complaint Dizziness    HPI Patrick Huffman is a 28 y.o. male here with mild dizziness.  The patient works as a Corporate investment banker.  He admits that he essentially drinks a case of [facility] per day.  He states he has been out in the sun more over the last several days.  He sat up this morning, and began to feel dizzy.  He describes it initially as a sensation of room spinning, but also a mild lightheadedness like he was going to pass out.  Symptoms have recurred every time he tries to sit up since then.  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  He tries to wear ear protection, but does have some intermittent tinnitus and ear pressure.  He also has a history of recurrent sinus infections this time of the year and has had some nasal congestion.  No headache.  No difficulty walking.  No ataxia.  No other medical complaints.  No fevers or chills.  No neck pain or neck stiffness.  No specific alleviating or aggravating factors.         Past Medical History:  Diagnosis Date  . Back pain   . Heart murmur   . Hernia, inguinal     There are no active problems to display for this patient.   History reviewed. No pertinent surgical history.  Prior to Admission medications   Medication Sig Start Date End Date Taking? Authorizing Provider  albuterol (PROVENTIL HFA;VENTOLIN HFA) 108 (90 Base) MCG/ACT inhaler Inhale 2 puffs every 6 (six) hours as needed into the lungs for wheezing or shortness of breath. 02/15/17   Enid Derry, PA-C  brompheniramine-pseudoephedrine-DM 30-2-10 MG/5ML syrup Take 10 mLs by mouth 4 (four) times daily as needed. 01/27/16   Cuthriell, Delorise Royals, PA-C  cetirizine (ZYRTEC)  10 MG tablet Take 1 tablet (10 mg total) by mouth daily. 01/27/16   Cuthriell, Delorise Royals, PA-C  fluticasone (FLONASE) 50 MCG/ACT nasal spray Place 2 sprays daily into both nostrils. 02/15/17 02/15/18  Enid Derry, PA-C  loratadine (CLARITIN) 10 MG tablet Take 1 tablet (10 mg total) by mouth daily for 14 days. 09/13/18 09/27/18  Shaune Pollack, MD  meclizine (ANTIVERT) 25 MG tablet Take 1 tablet (25 mg total) by mouth 3 (three) times daily as needed for dizziness. 09/13/18   Shaune Pollack, MD  meloxicam (MOBIC) 15 MG tablet Take 1 tablet (15 mg total) by mouth daily. 11/24/17   Cuthriell, Delorise Royals, PA-C  oxyCODONE-acetaminophen (PERCOCET) 10-325 MG tablet Take 2 tablets by mouth every 6 (six) hours as needed for pain.    [provider]  promethazine (PHENERGAN) 25 MG tablet Take 1 tablet (25 mg total) by mouth every 6 (six) hours as needed for nausea or vomiting. 05/07/16   Beers, Charmayne Sheer, PA-C    Allergies Prednisone  No family history on file.  Social History Social History   Tobacco Use  . Smoking status: Current Every Day Smoker    Packs/day: 0.50    Types: Cigarettes  . Smokeless tobacco: Never Used  Substance Use Topics  . Alcohol use: No  . Drug use: No    Review of Systems Constitutional: No fever/chills Eyes: No visual changes. ENT: No sore throat. Cardiovascular:  Denies chest pain. Respiratory: Denies shortness of breath. Gastrointestinal: No abdominal pain.  No nausea, no vomiting.  No diarrhea.  No constipation. Genitourinary: Negative for dysuria. Musculoskeletal: Negative for neck pain.  Negative for back pain. Integumentary: Negative for rash. Neurological: Negative for headaches, focal weakness or numbness.  ____________________________________________   PHYSICAL EXAM:  VITAL SIGNS: ED Triage Vitals  Enc Vitals Group     BP 09/13/18 1102 129/81     Pulse Rate 09/13/18 1102 67     Resp 09/13/18 1102 16     Temp 09/13/18 1102 98.2 F (36.8  C)     Temp Source 09/13/18 1102 Oral     SpO2 09/13/18 1102 98 %     Weight 09/13/18 1104 190 lb (86.2 kg)     Height 09/13/18 1104 6' (1.829 m)     Head Circumference --      Peak Flow --      Pain Score 09/13/18 1103 0     Pain Loc --      Pain Edu? --      Excl. in GC? --    Constitutional: Alert and oriented. Well appearing and in no acute distress. Eyes: Conjunctivae are normal.  Head: Atraumatic. Ears: Serous effusions in bilateral tympanic membranes, worse on the left.  No erythema. Nose: No congestion/rhinnorhea. Mouth/Throat: Mucous membranes are dry. Neck: No stridor.  No meningeal signs.   Cardiovascular: Normal rate, regular rhythm. Good peripheral circulation. Grossly normal heart sounds. Respiratory: Normal respiratory effort.  No retractions. No audible wheezing. Gastrointestinal: Soft and nontender. No distention.  Musculoskeletal: No lower extremity tenderness nor edema. No gross deformities of extremities. Neurologic:  Below Skin:  Skin is warm, dry and intact. No rash noted.   Neurological Exam:  Mental Status: Alert and oriented to person, place, and time. Attention and concentration normal. Speech clear. Recent memory is intact. Cranial Nerves: Visual fields grossly intact. EOMI and PERRLA. No nystagmus noted. Facial sensation intact at forehead, maxillary cheek, and chin/mandible bilaterally. No facial asymmetry or weakness. Hearing grossly normal. Uvula is midline, and palate elevates symmetrically. Normal SCM and trapezius strength. Tongue midline without fasciculations. Motor: Muscle strength 5/5 in proximal and distal UE and LE bilaterally. No pronator drift. Muscle tone normal. Reflexes: 2+ and symmetrical in all four extremities.  Sensation: Intact to light touch in upper and lower extremities distally bilaterally.  Gait: Normal without ataxia. Coordination: Normal FTN bilaterally.     ____________________________________________   LABS (all  labs ordered are listed, but only abnormal results are displayed)  Labs Reviewed  CBC  BASIC METABOLIC PANEL   ____________________________________________  EKG  Normal sinus rhythm, ventricular rate 63.  PR 116, QRS 95, QTc 396.  No apparent ischemic changes.  No evidence of prolonged QT, WPW, Brugada, or other arrhythmogenic abnormality. ____________________________________________  RADIOLOGY All imaging, including plain films, CT scans, and ultrasounds, independently reviewed by me, and interpretations confirmed via formal radiology reads.  ED MD interpretation:    Official radiology report(s): No results found.  ____________________________________________   PROCEDURES   Procedure(s) performed (including Critical Care):  Procedures   ____________________________________________   INITIAL IMPRESSION / MDM / ASSESSMENT AND PLAN / ED COURSE  As part of my medical decision making, I reviewed the following data within the electronic MEDICAL RECORD NUMBER Notes from prior ED visits and [place] Controlled Substance Database      *DAXTEN KOVALENKO was evaluated in Emergency Department on 09/13/2018 for the symptoms described in the history of present  illness. He was evaluated in the context of the global COVID-19 pandemic, which necessitated consideration that the patient might be at risk for infection with the SARS-CoV-2 virus that causes COVID-19. Institutional protocols and algorithms that pertain to the evaluation of patients at risk for COVID-19 are in a state of rapid change based on information released by regulatory bodies including the CDC and federal and state organizations. These policies and algorithms were followed during the patient's care in the ED.  Some ED evaluations and interventions may be delayed as a result of limited staffing during the pandemic.*      Medical Decision Making: 28 year old male here with mild lightheadedness upon standing and transient dizziness.   I suspect this could be multifactorial.  The primary component is likely due to dehydration secondary to working outside and essentially consuming only [facility].  He has no evidence of arrhythmia on telemetry or EKG here.  Electrolytes are normal.  No evidence of anemia.  White blood cell count is normal.  He has absolutely no focal neurological deficits.  He also has a mild component of serous effusions bilaterally, intermittent tinnitus, and could also be suffering from mild vertigo secondary to middle ear dysfunction.  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  Following IV fluids and meclizine, he feels markedly improved and is amatory without difficulty.  No apparent acute emergent pathology.  Will refer him to a primary care physician as an outpatient.  Encouraged him to decrease his caffeine and [employer]ountain Dew intake, encourage fluids and Gatorade, and will write him out of work for the next 2 days to recover, as he operates heavy machinery. ____________________________________________  FINAL CLINICAL IMPRESSION(S) / ED DIAGNOSES  Final diagnoses:  Vertigo  Lightheadedness     MEDICATIONS GIVEN DURING THIS VISIT:  Medications  sodium chloride 0.9 % bolus 1,000 mL (0 mLs Intravenous Stopped 09/13/18 1304)  meclizine (ANTIVERT) tablet 25 mg (25 mg Oral Given 09/13/18 1223)     ED Discharge Orders         Ordered    meclizine (ANTIVERT) 25 MG tablet  3 times daily PRN     09/13/18 1332    loratadine (CLARITIN) 10 MG tablet  Daily     09/13/18 1332           Note:  This document was prepared using Dragon voice recognition software and may include unintentional dictation errors.   Shaune PollackIsaacs, Kimmie Berggren, MD 09/13/18 [id number]

## 2018-11-26 ENCOUNTER — Emergency Department: Payer: Self-pay

## 2018-11-26 ENCOUNTER — Other Ambulatory Visit: Payer: Self-pay

## 2018-11-26 ENCOUNTER — Emergency Department
Admission: EM | Admit: 2018-11-26 | Discharge: 2018-11-26 | Disposition: A | Discharge status: Home / Self Care | Payer: Self-pay | Attending: Emergency Medicine | Admitting: Emergency Medicine

## 2018-11-26 DIAGNOSIS — R0602 Shortness of breath: Secondary | ICD-10-CM | POA: Insufficient documentation

## 2018-11-26 DIAGNOSIS — R110 Nausea: Secondary | ICD-10-CM | POA: Insufficient documentation

## 2018-11-26 DIAGNOSIS — F17210 Nicotine dependence, cigarettes, uncomplicated: Secondary | ICD-10-CM | POA: Insufficient documentation

## 2018-11-26 DIAGNOSIS — Z79899 Other long term (current) drug therapy: Secondary | ICD-10-CM | POA: Insufficient documentation

## 2018-11-26 LAB — CBC
HCT: 42.4 % (ref 39.0–52.0)
Hemoglobin: 14.2 g/dL (ref 13.0–17.0)
MCH: 28.4 pg (ref 26.0–34.0)
MCHC: 33.5 g/dL (ref 30.0–36.0)
MCV: 84.8 fL (ref 80.0–100.0)
Platelets: 208 10*3/uL (ref 150–400)
RBC: 5 MIL/uL (ref 4.22–5.81)
RDW: 12.3 % (ref 11.5–15.5)
WBC: 7.5 10*3/uL (ref 4.0–10.5)
nRBC: 0 % (ref 0.0–0.2)

## 2018-11-26 LAB — COMPREHENSIVE METABOLIC PANEL
ALT: 62 U/L — ABNORMAL HIGH (ref 0–44)
AST: 32 U/L (ref 15–41)
Albumin: 4.2 g/dL (ref 3.5–5.0)
Alkaline Phosphatase: 62 U/L (ref 38–126)
Anion gap: 7 (ref 5–15)
BUN: 12 mg/dL (ref 6–20)
CO2: 29 mmol/L (ref 22–32)
Calcium: 8.7 mg/dL — ABNORMAL LOW (ref 8.9–10.3)
Chloride: 103 mmol/L (ref 98–111)
Creatinine, Ser: 0.97 mg/dL (ref 0.61–1.24)
GFR calc Af Amer: >60 mL/min (ref >60)
GFR calc non Af Amer: >60 mL/min (ref >60)
Glucose, Bld: 79 mg/dL (ref 70–99)
Potassium: 3.7 mmol/L (ref 3.5–5.1)
Sodium: 139 mmol/L (ref 135–145)
Total Bilirubin: 0.4 mg/dL (ref 0.3–1.2)
Total Protein: 7.1 g/dL (ref 6.5–8.1)

## 2018-11-26 LAB — URINALYSIS, COMPLETE (UACMP) WITH MICROSCOPIC
Bacteria, UA: NONE SEEN
Bilirubin Urine: NEGATIVE
Glucose, UA: NEGATIVE mg/dL
Hgb urine dipstick: NEGATIVE
Ketones, ur: NEGATIVE mg/dL
Nitrite: NEGATIVE
Protein, ur: NEGATIVE mg/dL
Specific Gravity, Urine: 1.014 (ref 1.005–1.030)
Squamous Epithelial / HPF: NONE SEEN (ref 0–5)
pH: 7 (ref 5.0–8.0)

## 2018-11-26 LAB — URINE DRUG SCREEN, QUALITATIVE (ARMC ONLY)
Amphetamines, Ur Screen: NOT DETECTED
Barbiturates, Ur Screen: NOT DETECTED
Benzodiazepine, Ur Scrn: NOT DETECTED
Cannabinoid 50 Ng, Ur ~~LOC~~: NOT DETECTED
Cocaine Metabolite,Ur ~~LOC~~: NOT DETECTED
MDMA (Ecstasy)Ur Screen: NOT DETECTED
Methadone Scn, Ur: NOT DETECTED
Opiate, Ur Screen: POSITIVE — AB
Phencyclidine (PCP) Ur S: NOT DETECTED
Tricyclic, Ur Screen: NOT DETECTED

## 2018-11-26 LAB — TROPONIN I (HIGH SENSITIVITY): Troponin I (High Sensitivity): 6 ng/L (ref <18)

## 2018-11-26 MED ORDER — ONDANSETRON HCL 4 MG/2ML IJ SOLN
4.0000 mg | Freq: Once | INTRAMUSCULAR | Status: AC
  Administered 2018-11-26: 4 mg via INTRAVENOUS
  Filled 2018-11-26: qty 2

## 2018-11-26 MED ORDER — SODIUM CHLORIDE 0.9 % IV BOLUS
1000.0000 mL | Freq: Once | INTRAVENOUS | Status: AC
  Administered 2018-11-26: 07:00:00 1000 mL via INTRAVENOUS

## 2018-11-26 MED ORDER — KETOROLAC TROMETHAMINE 30 MG/ML IJ SOLN
15.0000 mg | Freq: Once | INTRAMUSCULAR | Status: AC
  Administered 2018-11-26: 15 mg via INTRAVENOUS
  Filled 2018-11-26: qty 1

## 2018-11-26 NOTE — ED Notes (Addendum)
When starting the IV fluid bolus, pt asked, "What's that for?" Explained to pt it was for hydration (r/t his c/o of being dehydrated). Pt then asks, "That won't mess with my Percocets, will it?"

## 2018-11-26 NOTE — ED Provider Notes (Signed)
[facility] Emergency Department Provider Note   ____________________________________________   First MD Initiated Contact with Patient 11/26/18 [phone]     (approximate)  I have reviewed the triage vital signs and the nursing notes.   HISTORY  Chief Complaint Other    HPI Patrick Huffman is a 28 y.o. male with no significant past medical history who presents to the ED complaining of shortness of breath and nausea.  Patient reports that he was sitting in bed earlier tonight when he had sudden onset of "cot[place]mouth" along with a feeling of nausea.  He states he did not vomit but spit up a small amount of liquid and was concerned and appeared blue in the toilet.  He states his girlfriend looked it up on the Internet and was concerned he may have copper poisoning.  He states he has felt short of breath since this happened, denies any chest pain, cough, or fever.  He reports continuing to feel nauseous, but has not vomited and denies any diarrhea.  He denies any abdominal pain with this episode.        Past Medical History:  Diagnosis Date  . Back pain   . Heart murmur   . Hernia, inguinal     There are no active problems to display for this patient.   History reviewed. No pertinent surgical history.  Prior to Admission medications   Medication Sig Start Date End Date Taking? Authorizing Provider  albuterol (PROVENTIL HFA;VENTOLIN HFA) 108 (90 Base) MCG/ACT inhaler Inhale 2 puffs every 6 (six) hours as needed into the lungs for wheezing or shortness of breath. Patient not taking: Reported on 11/26/2018 02/15/17   Laban Emperor, PA-C  brompheniramine-pseudoephedrine-DM 30-2-10 MG/5ML syrup Take 10 mLs by mouth 4 (four) times daily as needed. Patient not taking: Reported on 11/26/2018 01/27/16   Cuthriell, Charline Bills, PA-C  cetirizine (ZYRTEC) 10 MG tablet Take 1 tablet (10 mg total) by mouth daily. Patient not taking: Reported on 11/26/2018 01/27/16    Cuthriell, Charline Bills, PA-C  fluticasone [facility]) 50 MCG/ACT nasal spray Place 2 sprays daily into both nostrils. 02/15/17 02/15/18  Laban Emperor, PA-C  loratadine (CLARITIN) 10 MG tablet Take 1 tablet (10 mg total) by mouth daily for 14 days. 09/13/18 09/27/18  Duffy Bruce, MD  meclizine (ANTIVERT) 25 MG tablet Take 1 tablet (25 mg total) by mouth 3 (three) times daily as needed for dizziness. Patient not taking: Reported on 11/26/2018 09/13/18   Duffy Bruce, MD  meloxicam (MOBIC) 15 MG tablet Take 1 tablet (15 mg total) by mouth daily. Patient not taking: Reported on 11/26/2018 11/24/17   Cuthriell, Charline Bills, PA-C  oxyCODONE-acetaminophen (PERCOCET) 10-325 MG tablet Take 2 tablets by mouth every 6 (six) hours as needed for pain.    [provider]  promethazine (PHENERGAN) 25 MG tablet Take 1 tablet (25 mg total) by mouth every 6 (six) hours as needed for nausea or vomiting. Patient not taking: Reported on 11/26/2018 05/07/16   Arlyss Repress, PA-C    Allergies Prednisone  No family history on file.  Social History Social History   Tobacco Use  . Smoking status: Current Every Day Smoker    Packs/day: 0.50    Types: Cigarettes  . Smokeless tobacco: Never Used  Substance Use Topics  . Alcohol use: No  . Drug use: No    Review of Systems  Constitutional: No fever/chills Eyes: No visual changes. ENT: No sore throat. Cardiovascular: Denies chest pain. Respiratory: Positive for  shortness of breath. Gastrointestinal: No abdominal pain.  Positive for nausea, no vomiting.  No diarrhea.  No constipation. Genitourinary: Negative for dysuria. Musculoskeletal: Negative for back pain. Skin: Negative for rash. Neurological: Negative for headaches, focal weakness or numbness.  ____________________________________________   PHYSICAL EXAM:  VITAL SIGNS: ED Triage Vitals  Enc Vitals Group     BP 11/26/18 0602 (!) 138/92     Pulse Rate 11/26/18 0602 82     Resp 11/26/18  0602 18     Temp 11/26/18 0602 98.9 F (37.2 C)     Temp Source 11/26/18 0602 Oral     SpO2 11/26/18 0557 98 %     Weight 11/26/18 0602 185 lb (83.9 kg)     Height 11/26/18 0602 6' (1.829 m)     Head Circumference --      Peak Flow --      Pain Score 11/26/18 0602 0     Pain Loc --      Pain Edu? --      Excl. in GC? --     Constitutional: Alert and oriented. Eyes: Conjunctivae are normal. Head: Atraumatic. Nose: No congestion/rhinnorhea. Mouth/Throat: Mucous membranes are moist. Neck: Normal ROM Cardiovascular: Normal rate, regular rhythm. Grossly normal heart sounds. Respiratory: Normal respiratory effort.  No retractions. Lungs CTAB. Gastrointestinal: Soft and nontender. No distention. Genitourinary: deferred Musculoskeletal: No lower extremity tenderness nor edema. Neurologic:  Normal speech and language. No gross focal neurologic deficits are appreciated. Skin:  Skin is warm, dry and intact. No rash noted. Psychiatric: Mood and affect are normal. Speech and behavior are normal.  ____________________________________________   LABS (all labs ordered are listed, but only abnormal results are displayed)  Labs Reviewed  COMPREHENSIVE METABOLIC PANEL - Abnormal; Notable for the following components:      Result Value   Calcium 8.7 (*)    ALT 62 (*)    All other components within normal limits  URINE DRUG SCREEN, QUALITATIVE ([facility] ONLY) - Abnormal; Notable for the following components:   Opiate, Ur Screen POSITIVE (*)    All other components within normal limits  URINALYSIS, COMPLETE (UACMP) WITH MICROSCOPIC - Abnormal; Notable for the following components:   Color, Urine YELLOW (*)    APPearance HAZY (*)    Leukocytes,Ua SMALL (*)    All other components within normal limits  CBC  TROPONIN I (HIGH SENSITIVITY)  TROPONIN I (HIGH SENSITIVITY)   ____________________________________________  EKG  ED ECG REPORT I, Chesley Noonharles Akaylah Lalley, the attending physician, personally  viewed and interpreted this ECG.   Date: 11/26/2018  EKG Time: 7:38  Rate: 75  Rhythm: normal EKG, normal sinus rhythm  Axis: Normal  Intervals:none  ST&T Change: None    PROCEDURES  Procedure(s) performed (including Critical Care):  Procedures   ____________________________________________   INITIAL IMPRESSION / ASSESSMENT AND PLAN / ED COURSE       28 year old male presenting with episode of nausea and "cottonmouth", was concerned after he felt he vomited some blue material.  Patient well-appearing now, no recent suspect poisoning or or ingestion, he denies drug abuse.  Labs unremarkable, will screen EKG and chest x-ray given his described shortness of breath, but suspect that anxiety is a component.  Do not suspect PE as he is PERC negative.  EKG without acute ischemic changes, chest x-ray negative for acute process.  Patient reports feeling better, will discharge home with PCP follow-up.      ____________________________________________   FINAL CLINICAL IMPRESSION(S) / ED DIAGNOSES  Final  diagnoses:  Nausea  Shortness of breath     ED Discharge Orders    None       Note:  This document was prepared using Dragon voice recognition software and may include unintentional dictation errors.   Chesley NoonJessup, Petina Muraski, MD 11/26/18 [phone]

## 2018-11-26 NOTE — ED Triage Notes (Addendum)
Pt arrives to ED via [facility] from home with multiple medical complaints. Pt states he woke up from sleep tonight "feeling like my mouth was so dry, like cottonmouth. I drank some water and then felt like I needed to throw up. Like when your mouth gets all watery, ya know?". Pt then explains he didn't actually vomit, but began spitting into the toilet. Pt then begins to explain that what he and his wife saw in the toilet when the light was turned on was "blue" and "according to the internet it could be copper poisoning". Pt also states he feels dehydrated, and states all s/x's started tonight PTA. Pt denies any c/o pain, no SHOB. Pt is A&O, in NAD; RR even, regular, and unlabored.

## 2019-02-26 ENCOUNTER — Other Ambulatory Visit: Payer: Self-pay

## 2019-02-26 ENCOUNTER — Emergency Department
Admission: EM | Admit: 2019-02-26 | Discharge: 2019-02-26 | Disposition: A | Discharge status: Home / Self Care | Payer: Self-pay | Attending: Emergency Medicine | Admitting: Emergency Medicine

## 2019-02-26 DIAGNOSIS — Z5321 Procedure and treatment not carried out due to patient leaving prior to being seen by health care provider: Secondary | ICD-10-CM | POA: Insufficient documentation

## 2019-02-26 NOTE — ED Triage Notes (Signed)
t to the er for ringing in the ears x 2 hours. Pt denies previous hx or any other symptoms.

## 2019-04-12 ENCOUNTER — Other Ambulatory Visit: Payer: Self-pay

## 2019-04-12 ENCOUNTER — Encounter: Payer: Self-pay | Admitting: Emergency Medicine

## 2019-04-12 ENCOUNTER — Emergency Department: Payer: Self-pay

## 2019-04-12 ENCOUNTER — Emergency Department
Admission: EM | Admit: 2019-04-12 | Discharge: 2019-04-12 | Disposition: A | Discharge status: Home / Self Care | Payer: Self-pay | Attending: Emergency Medicine | Admitting: Emergency Medicine

## 2019-04-12 DIAGNOSIS — R079 Chest pain, unspecified: Secondary | ICD-10-CM

## 2019-04-12 DIAGNOSIS — F17210 Nicotine dependence, cigarettes, uncomplicated: Secondary | ICD-10-CM | POA: Insufficient documentation

## 2019-04-12 DIAGNOSIS — R0602 Shortness of breath: Secondary | ICD-10-CM | POA: Insufficient documentation

## 2019-04-12 DIAGNOSIS — R109 Unspecified abdominal pain: Secondary | ICD-10-CM

## 2019-04-12 DIAGNOSIS — R140 Abdominal distension (gaseous): Secondary | ICD-10-CM | POA: Insufficient documentation

## 2019-04-12 LAB — BASIC METABOLIC PANEL
Anion gap: 9 (ref 5–15)
BUN: 13 mg/dL (ref 6–20)
CO2: 28 mmol/L (ref 22–32)
Calcium: 8.9 mg/dL (ref 8.9–10.3)
Chloride: 102 mmol/L (ref 98–111)
Creatinine, Ser: 1.17 mg/dL (ref 0.61–1.24)
GFR calc Af Amer: >60 mL/min (ref >60)
GFR calc non Af Amer: >60 mL/min (ref >60)
Glucose, Bld: 99 mg/dL (ref 70–99)
Potassium: 3.7 mmol/L (ref 3.5–5.1)
Sodium: 139 mmol/L (ref 135–145)

## 2019-04-12 LAB — CBC
HCT: 48.4 % (ref 39.0–52.0)
Hemoglobin: 15.9 g/dL (ref 13.0–17.0)
MCH: 28.2 pg (ref 26.0–34.0)
MCHC: 32.9 g/dL (ref 30.0–36.0)
MCV: 86 fL (ref 80.0–100.0)
Platelets: 202 10*3/uL (ref 150–400)
RBC: 5.63 MIL/uL (ref 4.22–5.81)
RDW: 12.3 % (ref 11.5–15.5)
WBC: 9.2 10*3/uL (ref 4.0–10.5)
nRBC: 0 % (ref 0.0–0.2)

## 2019-04-12 LAB — TROPONIN I (HIGH SENSITIVITY): Troponin I (High Sensitivity): 3 ng/L (ref <18)

## 2019-04-12 MED ORDER — DICYCLOMINE HCL 20 MG PO TABS: 20.0000 mg | ORAL_TABLET | Freq: Three times a day (TID) | ORAL | 0 refills | Status: DC | PRN

## 2019-04-12 MED ORDER — DICYCLOMINE HCL 10 MG PO CAPS
20.0000 mg | ORAL_CAPSULE | Freq: Once | ORAL | Status: AC
  Administered 2019-04-12: 20:00:00 20 mg via ORAL
  Filled 2019-04-12: qty 2

## 2019-04-12 NOTE — ED Notes (Signed)
This RN directs patient into Triage 1; this RN asked patient to put mask on, pt ignores request, continues to sit there without any verbal response.  This RN asked again for patient to put mask on so that I could start his EKG, pt states, "I am having trouble breathing, I cannot put my mask on right now."  NAD noted at this time.  Will check back with patient momentarily to see if he is willing to cooperate.

## 2019-04-12 NOTE — ED Provider Notes (Signed)
[facility] Emergency Department Provider Note  Time seen: 7:36 PM  I have reviewed the triage vital signs and the nursing notes.   HISTORY  Chief Complaint Chest Pain   HPI Patrick Huffman is a 28 y.o. male with a past medical history of palpitation presents to the emergency department for intermittent chest pain and some abdominal tightness.  According to the patient he donated plasma earlier today shortly afterwards developed abdominal tightness which he describes more as bloating in the bilateral flanks.  Patient also states he has been experiencing intermittent chest pain but states this has been an ongoing issue for the past 7 months, followed up with a cardiologist previously has had a stress test with no findings per patient.  Denies any shortness of breath cough or fever.   Past Medical History:  Diagnosis Date  . Back pain   . Heart murmur   . Hernia, inguinal     There are no problems to display for this patient.   History reviewed. No pertinent surgical history.  Prior to Admission medications   Medication Sig Start Date End Date Taking? Authorizing Provider  albuterol (PROVENTIL HFA;VENTOLIN HFA) 108 (90 Base) MCG/ACT inhaler Inhale 2 puffs every 6 (six) hours as needed into the lungs for wheezing or shortness of breath. Patient not taking: Reported on 11/26/2018 02/15/17   Enid Derry, PA-C  brompheniramine-pseudoephedrine-DM 30-2-10 MG/5ML syrup Take 10 mLs by mouth 4 (four) times daily as needed. Patient not taking: Reported on 11/26/2018 01/27/16   Cuthriell, Delorise Royals, PA-C  cetirizine (ZYRTEC) 10 MG tablet Take 1 tablet (10 mg total) by mouth daily. Patient not taking: Reported on 11/26/2018 01/27/16   Cuthriell, Delorise Royals, PA-C  fluticasone [facility]) 50 MCG/ACT nasal spray Place 2 sprays daily into both nostrils. 02/15/17 02/15/18  Enid Derry, PA-C  loratadine (CLARITIN) 10 MG tablet Take 1 tablet (10 mg total) by mouth daily for 14  days. 09/13/18 09/27/18  Shaune Pollack, MD  meclizine (ANTIVERT) 25 MG tablet Take 1 tablet (25 mg total) by mouth 3 (three) times daily as needed for dizziness. Patient not taking: Reported on 11/26/2018 09/13/18   Shaune Pollack, MD  meloxicam (MOBIC) 15 MG tablet Take 1 tablet (15 mg total) by mouth daily. Patient not taking: Reported on 11/26/2018 11/24/17   Cuthriell, Delorise Royals, PA-C  oxyCODONE-acetaminophen (PERCOCET) 10-325 MG tablet Take 2 tablets by mouth every 6 (six) hours as needed for pain.    [provider]  promethazine (PHENERGAN) 25 MG tablet Take 1 tablet (25 mg total) by mouth every 6 (six) hours as needed for nausea or vomiting. Patient not taking: Reported on 11/26/2018 05/07/16   Evangeline Dakin, PA-C    Allergies  Allergen Reactions  . Prednisone Rash    Unknown if prednisone. Patient states "steroid pills"    No family history on file.  Social History Social History   Tobacco Use  . Smoking status: Current Every Day Smoker    Packs/day: 0.50    Types: Cigarettes  . Smokeless tobacco: Never Used  Substance Use Topics  . Alcohol use: No  . Drug use: No    Review of Systems Constitutional: Negative for fever. Cardiovascular: Intermittent chest pain times months. Respiratory: Negative for shortness of breath.  Negative for cough. Gastrointestinal: Bilateral abdominal tightness or bloating per patient.  Negative for nausea vomiting or diarrhea. Musculoskeletal: Negative for musculoskeletal complaints Neurological: Negative for headache All other ROS negative  ____________________________________________   PHYSICAL EXAM:  VITAL SIGNS: ED Triage Vitals [04/12/19 1820]  Enc Vitals Group     BP 116/80     Pulse Rate 75     Resp 20     Temp 98.6 F (37 C)     Temp Source Oral     SpO2 99 %     Weight 200 lb (90.7 kg)     Height 6' (1.829 m)     Head Circumference      Peak Flow      Pain Score 7     Pain Loc      Pain Edu?      Excl. in  [place]?     Constitutional: Alert and oriented. Well appearing and in no distress. Eyes: Normal exam ENT      Head: Normocephalic and atraumatic      Mouth/Throat: Mucous membranes are moist. Cardiovascular: Normal rate, regular rhythm.  Respiratory: Normal respiratory effort without tachypnea nor retractions. Breath sounds are clear  Gastrointestinal: Soft and nontender. No distention.  Musculoskeletal: Nontender with normal range of motion in all extremities.  Neurologic:  Normal speech and language. No gross focal neurologic deficits Skin:  Skin is warm, dry and intact.  Psychiatric: Mood and affect are normal.  ____________________________________________    EKG  EKG viewed and interpreted by myself shows a normal sinus rhythm at 71 bpm with a narrow QRS, normal axis, normal intervals, no concerning ST changes  ____________________________________________    RADIOLOGY  X-ray negative  ____________________________________________   INITIAL IMPRESSION / ASSESSMENT AND PLAN / ED COURSE  Pertinent labs & imaging results that were available during my care of the patient were reviewed by me and considered in my medical decision making (see chart for details).   Patient presents to the emergency department for abdominal tightness/bloating as well as intermittent chest pain shortness of breath x7 months.  Overall patient appears well, no acute distress denies any chest pain at this time.  No significant abdominal discomfort.  Denies any fever nausea vomiting diarrhea.  Overall the patient appears well reassuring physical exam no abdominal tenderness on my exam.  Patient thinks he could be dehydrated after plasma donation which is possible as well.  We will dose Bentyl I discussed with the patient proceed with CT imaging patient would like to hold off for now and orally hydrate at home which I believe is a reasonable plan of care.  We will discharge patient home with PCP  follow-up.  Patrick Huffman was evaluated in Emergency Department on 04/12/2019 for the symptoms described in the history of present illness. He was evaluated in the context of the global COVID-19 pandemic, which necessitated consideration that the patient might be at risk for infection with the SARS-CoV-2 virus that causes COVID-19. Institutional protocols and algorithms that pertain to the evaluation of patients at risk for COVID-19 are in a state of rapid change based on information released by regulatory bodies including the CDC and federal and state organizations. These policies and algorithms were followed during the patient's care in the ED.  ____________________________________________   FINAL CLINICAL IMPRESSION(S) / ED DIAGNOSES  Abdominal pain Chest pain   Harvest Dark, MD 04/12/19 1939

## 2019-04-12 NOTE — ED Triage Notes (Signed)
Pt in via POV, states, "Im bloated, I have a headache, Im short of breath and having chest pain."  States symptoms began approximately one hour ago.  Vitals WDL.  Ambulatory to triage; NAD noted at this time.

## 2019-04-12 NOTE — ED Notes (Addendum)
See triage note. Pt reports "bloating" to bil lower sides. States areas seem swollen and he's having SOB as a result. Pt reports chest pain has been ongoing x7 months and "they say everything looks normal every time." pt states he saw cardiology who wanted him to do a stress test and he told them "if all you're gonna do is have me run on a treadmill I'll just go to the gym. It's a hundred fucking dollars every time to go see them."

## 2019-06-12 ENCOUNTER — Other Ambulatory Visit: Payer: Self-pay

## 2019-06-12 ENCOUNTER — Emergency Department: Payer: Self-pay

## 2019-06-12 ENCOUNTER — Emergency Department
Admission: EM | Admit: 2019-06-12 | Discharge: 2019-06-12 | Disposition: A | Discharge status: Home / Self Care | Payer: Self-pay | Attending: Emergency Medicine | Admitting: Emergency Medicine

## 2019-06-12 DIAGNOSIS — F17210 Nicotine dependence, cigarettes, uncomplicated: Secondary | ICD-10-CM | POA: Insufficient documentation

## 2019-06-12 DIAGNOSIS — R2232 Localized swelling, mass and lump, left upper limb: Secondary | ICD-10-CM | POA: Insufficient documentation

## 2019-06-12 DIAGNOSIS — R079 Chest pain, unspecified: Secondary | ICD-10-CM | POA: Insufficient documentation

## 2019-06-12 DIAGNOSIS — R0602 Shortness of breath: Secondary | ICD-10-CM | POA: Insufficient documentation

## 2019-06-12 DIAGNOSIS — M79602 Pain in left arm: Secondary | ICD-10-CM | POA: Insufficient documentation

## 2019-06-12 DIAGNOSIS — R202 Paresthesia of skin: Secondary | ICD-10-CM | POA: Insufficient documentation

## 2019-06-12 LAB — COMPREHENSIVE METABOLIC PANEL
ALT: 72 U/L — ABNORMAL HIGH (ref 0–44)
AST: 47 U/L — ABNORMAL HIGH (ref 15–41)
Albumin: 4.6 g/dL (ref 3.5–5.0)
Alkaline Phosphatase: 60 U/L (ref 38–126)
Anion gap: 5 (ref 5–15)
BUN: 14 mg/dL (ref 6–20)
CO2: 31 mmol/L (ref 22–32)
Calcium: 9 mg/dL (ref 8.9–10.3)
Chloride: 103 mmol/L (ref 98–111)
Creatinine, Ser: 1.09 mg/dL (ref 0.61–1.24)
GFR calc Af Amer: >60 mL/min (ref >60)
GFR calc non Af Amer: >60 mL/min (ref >60)
Glucose, Bld: 91 mg/dL (ref 70–99)
Potassium: 3.7 mmol/L (ref 3.5–5.1)
Sodium: 139 mmol/L (ref 135–145)
Total Bilirubin: 0.8 mg/dL (ref 0.3–1.2)
Total Protein: 7.4 g/dL (ref 6.5–8.1)

## 2019-06-12 LAB — CBC WITH DIFFERENTIAL/PLATELET
Abs Immature Granulocytes: 0.01 10*3/uL (ref 0.00–0.07)
Basophils Absolute: 0.1 10*3/uL (ref 0.0–0.1)
Basophils Relative: 1 %
Eosinophils Absolute: 0.2 10*3/uL (ref 0.0–0.5)
Eosinophils Relative: 3 %
HCT: 44.5 % (ref 39.0–52.0)
Hemoglobin: 14.5 g/dL (ref 13.0–17.0)
Immature Granulocytes: 0 %
Lymphocytes Relative: 53 %
Lymphs Abs: 4.1 10*3/uL — ABNORMAL HIGH (ref 0.7–4.0)
MCH: 28 pg (ref 26.0–34.0)
MCHC: 32.6 g/dL (ref 30.0–36.0)
MCV: 86.1 fL (ref 80.0–100.0)
Monocytes Absolute: 0.6 10*3/uL (ref 0.1–1.0)
Monocytes Relative: 7 %
Neutro Abs: 2.7 10*3/uL (ref 1.7–7.7)
Neutrophils Relative %: 36 %
Platelets: 215 10*3/uL (ref 150–400)
RBC: 5.17 MIL/uL (ref 4.22–5.81)
RDW: 12.4 % (ref 11.5–15.5)
WBC: 7.6 10*3/uL (ref 4.0–10.5)
nRBC: 0 % (ref 0.0–0.2)

## 2019-06-12 LAB — TROPONIN I (HIGH SENSITIVITY)
Troponin I (High Sensitivity): 3 ng/L (ref <18)
Troponin I (High Sensitivity): 4 ng/L (ref <18)

## 2019-06-12 MED ORDER — IBUPROFEN 600 MG PO TABS: 600.0000 mg | ORAL_TABLET | Freq: Four times a day (QID) | ORAL | 0 refills | Status: AC | PRN

## 2019-06-12 MED ORDER — ALBUTEROL SULFATE HFA 108 (90 BASE) MCG/ACT IN AERS: 2.0000 | INHALATION_SPRAY | Freq: Four times a day (QID) | RESPIRATORY_TRACT | 0 refills | Status: DC | PRN

## 2019-06-12 MED ORDER — SODIUM CHLORIDE 0.9 % IV BOLUS
1000.0000 mL | Freq: Once | INTRAVENOUS | Status: AC
  Administered 2019-06-12: 1000 mL via INTRAVENOUS

## 2019-06-12 MED ORDER — KETOROLAC TROMETHAMINE 30 MG/ML IJ SOLN
15.0000 mg | Freq: Once | INTRAMUSCULAR | Status: AC
  Administered 2019-06-12: 15 mg via INTRAVENOUS
  Filled 2019-06-12: qty 1

## 2019-06-12 MED ORDER — ACETAMINOPHEN 500 MG PO TABS
1000.0000 mg | ORAL_TABLET | Freq: Once | ORAL | Status: DC
  Filled 2019-06-12: qty 2

## 2019-06-12 NOTE — ED Notes (Signed)
Pt given remote and 2 warm blankets at this time. Pt continues to rest in bed, NAD noted at this time. Call bell remains within reach. VSS

## 2019-06-12 NOTE — ED Provider Notes (Signed)
[facility] Emergency Department Provider Note  ____________________________________________   First MD Initiated Contact with Patient 06/12/19 [phone]     (approximate)  I have reviewed the triage vital signs and the nursing notes.   HISTORY  Chief Complaint Arm Pain    HPI Patrick Huffman is a 29 y.o. male who comes in for arm pain most notably.  Patient states that he was working with some heavy machinery and the next day he noted pain in his elbow.  Does not remember having any event that could have caused this pain.  Did not fall on the arm.  The pain is severe, constant, nothing makes it better, nothing makes it worse.  Patient stated that there was a knot on the arm that is now gone.  Patient states that he was worried he had a DVT.    Patient had previous stress test that was negative.  Patient then reports that he has had intermittent chest pain for years since he was 29 years old.  Sometimes he has some tingling sensation in his hand associated with it.  He also sometimes develops a little bit of shortness of breath with it.  He states that he is having the pain right now.  Patient also states that his father died 3 days ago.  Patient denies any drug use.  Denies risk factors for PE            Past Medical History:  Diagnosis Date  . Back pain   . Heart murmur   . Hernia, inguinal     There are no problems to display for this patient.   No past surgical history on file.  Prior to Admission medications   Medication Sig Start Date End Date Taking? Authorizing Provider  albuterol (PROVENTIL HFA;VENTOLIN HFA) 108 (90 Base) MCG/ACT inhaler Inhale 2 puffs every 6 (six) hours as needed into the lungs for wheezing or shortness of breath. Patient not taking: Reported on 11/26/2018 02/15/17   Enid Derry, PA-C  brompheniramine-pseudoephedrine-DM 30-2-10 MG/5ML syrup Take 10 mLs by mouth 4 (four) times daily as needed. Patient not taking:  Reported on 11/26/2018 01/27/16   Cuthriell, Delorise Royals, PA-C  cetirizine (ZYRTEC) 10 MG tablet Take 1 tablet (10 mg total) by mouth daily. Patient not taking: Reported on 11/26/2018 01/27/16   Cuthriell, Delorise Royals, PA-C  dicyclomine (BENTYL) 20 MG tablet Take 1 tablet (20 mg total) by mouth 3 (three) times daily as needed for spasms. 04/12/19 04/11/20  Minna Antis, MD  fluticasone (FLONASE) 50 MCG/ACT nasal spray Place 2 sprays daily into both nostrils. 02/15/17 02/15/18  Enid Derry, PA-C  loratadine (CLARITIN) 10 MG tablet Take 1 tablet (10 mg total) by mouth daily for 14 days. 09/13/18 09/27/18  Shaune Pollack, MD  meclizine (ANTIVERT) 25 MG tablet Take 1 tablet (25 mg total) by mouth 3 (three) times daily as needed for dizziness. Patient not taking: Reported on 11/26/2018 09/13/18   Shaune Pollack, MD  meloxicam (MOBIC) 15 MG tablet Take 1 tablet (15 mg total) by mouth daily. Patient not taking: Reported on 11/26/2018 11/24/17   Cuthriell, Delorise Royals, PA-C  oxyCODONE-acetaminophen (PERCOCET) 10-325 MG tablet Take 2 tablets by mouth every 6 (six) hours as needed for pain.    [provider]  promethazine (PHENERGAN) 25 MG tablet Take 1 tablet (25 mg total) by mouth every 6 (six) hours as needed for nausea or vomiting. Patient not taking: Reported on 11/26/2018 05/07/16   Evangeline Dakin, PA-C  Allergies Prednisone  No family history on file.  Social History Social History   Tobacco Use  . Smoking status: Current Every Day Smoker    Packs/day: 0.50    Types: Cigarettes  . Smokeless tobacco: Never Used  Substance Use Topics  . Alcohol use: No  . Drug use: No      Review of Systems Constitutional: No fever/chills Eyes: No visual changes. ENT: No sore throat. Cardiovascular: Positive chest pain Respiratory: Positive shortness of breath Gastrointestinal: No abdominal pain.  No nausea, no vomiting.  No diarrhea.  No constipation. Genitourinary: Negative for  dysuria. Musculoskeletal: Negative for back pain.  Positive arm pain Skin: Negative for rash. Neurological: Negative for headaches, focal weakness or numbness.  Positive dizziness All other ROS negative ____________________________________________   PHYSICAL EXAM:  VITAL SIGNS: ED Triage Vitals  Enc Vitals Group     BP 06/12/19 0417 132/80     Pulse Rate 06/12/19 0417 83     Resp 06/12/19 0417 18     Temp 06/12/19 0417 97.8 F (36.6 C)     Temp Source 06/12/19 0417 Oral     SpO2 06/12/19 0417 99 %     Weight 06/12/19 0415 200 lb (90.7 kg)     Height 06/12/19 0415 6' (1.829 m)     Head Circumference --      Peak Flow --      Pain Score --      Pain Loc --      Pain Edu? --      Excl. in [place]? --     Constitutional: Alert and oriented. Well appearing and in no acute distress. Eyes: Conjunctivae are normal. EOMI. Head: Atraumatic. Nose: No congestion/rhinnorhea. Mouth/Throat: Mucous membranes are moist.   Neck: No stridor. Trachea Midline. FROM Cardiovascular: Normal rate, regular rhythm. Grossly normal heart sounds.  Good peripheral circulation. Respiratory: Normal respiratory effort.  No retractions.  Very minimal expiratory wheezing. Gastrointestinal: Soft and nontender. No distention. No abdominal bruits.  Musculoskeletal: No lower extremity tenderness nor edema.  No joint effusions.  No erythema, no warmth, some pain at the elbow, no obvious swelling.  2+ distal pulse. Neurologic:  Normal speech and language. No gross focal neurologic deficits are appreciated.  Skin:  Skin is warm, dry and intact. No rash noted. Psychiatric: Mood and affect are normal. Speech and behavior are normal. GU: Deferred   ____________________________________________   LABS (all labs ordered are listed, but only abnormal results are displayed)  Labs Reviewed  CBC WITH DIFFERENTIAL/PLATELET - Abnormal; Notable for the following components:      Result Value   Lymphs Abs 4.1 (*)    All  other components within normal limits  COMPREHENSIVE METABOLIC PANEL - Abnormal; Notable for the following components:   AST 47 (*)    ALT 72 (*)    All other components within normal limits  URINE DRUG SCREEN, QUALITATIVE ([facility] ONLY)  URINALYSIS, ROUTINE W REFLEX MICROSCOPIC  TROPONIN I (HIGH SENSITIVITY)  TROPONIN I (HIGH SENSITIVITY)   ____________________________________________   ED ECG REPORT I, Concha Se, the attending physician, personally viewed and interpreted this ECG.  EKG is normal sinus rate of 76, no ST elevation, no T wave inversions, normal intervals  Repeat EKG normal sinus rate of 64, no ST elevation no T wave inversions, normal intervals ____________________________________________  RADIOLOGY Vela Prose, personally viewed and evaluated these images (plain radiographs) as part of my medical decision making, as well as reviewing the written report  by the radiologist.  ED MD interpretation: No pneumonia or effusions  Official radiology report(s): US Venous Img Upper Uni Left  Result Date: 06/12/2019 CLINICAL DATA:  Shortness of breath. Pain and tingling in the left upper extremity. EXAM: Left UPPER EXTREMITY VENOUS DOPPLER ULTRASOUND TECHNIQUE: Gray-scale sonography with graded compression, as well as color Doppler and duplex ultrasound were performed to evaluate the upper extremity deep venous system from the level of the subclavian vein and including the jugular, axillary, basilic, radial, ulnar and upper cephalic vein. Spectral Doppler was utilized to evaluate flow at rest and with distal augmentation maneuvers. COMPARISON:  One-view chest x-ray 06/12/2019 FINDINGS: Contralateral Subclavian Vein: Respiratory phasicity is normal and symmetric with the symptomatic side. No evidence of thrombus. Normal compressibility. Internal Jugular Vein: No evidence of thrombus. Normal compressibility, respiratory phasicity and response to augmentation. Subclavian Vein: No  evidence of thrombus. Normal compressibility, respiratory phasicity and response to augmentation. Axillary Vein: No evidence of thrombus. Normal compressibility, respiratory phasicity and response to augmentation. Cephalic Vein: No evidence of thrombus. Normal compressibility, respiratory phasicity and response to augmentation. Basilic Vein: No evidence of thrombus. Normal compressibility, respiratory phasicity and response to augmentation. Brachial Veins: No evidence of thrombus. Normal compressibility, respiratory phasicity and response to augmentation. Radial Veins: No evidence of thrombus. Normal compressibility, respiratory phasicity and response to augmentation. Ulnar Veins: No evidence of thrombus. Normal compressibility, respiratory phasicity and response to augmentation. Venous Reflux:  None visualized. Other Findings:  None visualized. IMPRESSION: No evidence of DVT within the left upper extremity. Electronically Signed   By: San Morelle M.D.   On: 06/12/2019 06:11   DG Chest Portable 1 View  Result Date: 06/12/2019 CLINICAL DATA:  Left upper abdomen scratched at left upper arm pain for 2 days. Tingling sensation. Chest pain. EXAM: PORTABLE CHEST 1 VIEW COMPARISON:  One-view chest x-ray 04/12/2019 FINDINGS: The heart size is normal. Mild pulmonary vascular congestion is present without frank edema. There is no edema or effusion. No focal airspace disease is present. IMPRESSION: Mild pulmonary vascular congestion without frank edema. Electronically Signed   By: San Morelle M.D.   On: 06/12/2019 05:31    ____________________________________________   PROCEDURES  Procedure(s) performed (including Critical Care):  Procedures   ____________________________________________   INITIAL IMPRESSION / ASSESSMENT AND PLAN / ED COURSE  Patrick Huffman was evaluated in Emergency Department on 06/12/2019 for the symptoms described in the history of present illness. He was evaluated  in the context of the global COVID-19 pandemic, which necessitated consideration that the patient might be at risk for infection with the SARS-CoV-2 virus that causes COVID-19. Institutional protocols and algorithms that pertain to the evaluation of patients at risk for COVID-19 are in a state of rapid change based on information released by regulatory bodies including the CDC and federal and state organizations. These policies and algorithms were followed during the patient's care in the ED.    Patient is a 29 year old gentleman who comes in with multiple concerns most of that have been chronic in nature but his biggest concern is some left arm pain.  No trauma to suggest fracture.  Do not think x-ray would be beneficial.  Patient has no evidence of abscess, cellulitis, septic joint.  Will get ultrasound evaluate for DVT.  No evidence of arterial occlusion on exam.  Most likely musculoskeletal in nature.  Given the chest pain will get cardiac markers and EKG.  Will get chest x-ray due to some shortness of breath although satting normal.  Low suspicion for PE given he is PERC negative.  Low suspicion for dissection given good equal pulses, normal chest x-ray and not hypertensive.  On my examination patient does have some very mild wheezing.  Patient does report long history of smoking.  We discussed cessation.  Patient is prescribed albuterol in the past.  Will give a refill.  Patient's work-up is reassuring.  Chest x-ray with some mild pulmonary vascular congestion.  Patient satting 100%.  Discussed with patient he can follow-up outpatient for echocardiogram.  Cardiac markers negative x2.  Liver function tests are slightly elevated.  No right upper quadrant tenderness.  Patient is had some elevations in the past.  Patient can follow-up with this with his primary doctor as well.  Patient denies any urinary symptoms.  Patient does not want to give a urine prior to leaving.  Discussed with patient the  reassuring work-up.  Patient states that is not good that I did not find anything.  I discussed with patient that we are ruling out the worst case scenarios and he can follow-up with his primary care doctor we discussed symptomatic treatment with Tylenol ibuprofen.  Patient states that he is already on Vicodin for pain.  Discussed with patient using the ibuprofen to help with inflammation.  Patient will follow up with primary care doctor.      ____________________________________________   FINAL CLINICAL IMPRESSION(S) / ED DIAGNOSES   Final diagnoses:  Left arm pain  Chest pain, unspecified type      MEDICATIONS GIVEN DURING THIS VISIT:  Medications  acetaminophen (TYLENOL) tablet 1,000 mg (1,000 mg Oral Refused 06/12/19 0529)  ketorolac (TORADOL) 30 MG/ML injection 15 mg (15 mg Intravenous Given 06/12/19 0529)  sodium chloride 0.9 % bolus 1,000 mL (1,000 mLs Intravenous New Bag/Given 06/12/19 0527)     ED Discharge Orders         Ordered    ibuprofen (ADVIL) 600 MG tablet  Every 6 hours PRN     06/12/19 0733    albuterol (VENTOLIN HFA) 108 (90 Base) MCG/ACT inhaler  Every 6 hours PRN     06/12/19 0734           Note:  This document was prepared using Dragon voice recognition software and may include unintentional dictation errors.   Concha Se, MD 06/12/19 [phone]

## 2019-06-12 NOTE — Discharge Instructions (Addendum)
Your x-ray is as below.  He can follow-up for an echocardiogram outpatient.  Other labs and cardiac markers were negative.  You take the ibuprofen with meals to help with any of the chest discomfort or elbow pain.  No signs of DVT.  Return to the ER for fevers or any other concerns  Your lungs did have some wheezing in it.  You should cut down on your smoking.  I have prescribed you an inhaler to use if you feel short of breath to see if that helps   Mild pulmonary vascular congestion without frank edema. Electronically Signed   By: Marin Roberts M.D.   On: 06/12/2019 05:31

## 2019-06-12 NOTE — ED Triage Notes (Addendum)
Patient reports left upper arm pain for 2 days and had noticed a "knot" that is now gone..  States started with tingling sensation in left hand approximately 1 hour prior to coming to ED.  Patient also reports having chest pain.

## 2019-06-12 NOTE — ED Notes (Addendum)
This RN to bedside, pt states "what do you think is causing my leg to be numb, that's not normal". This RN explained EDP would be to bedside to speak with him regarding medical complaints. Pt states "you can't tell me or you don't know?" This RN explained the role of ED RN to patient. Pt given warm blanket at this time.

## 2019-06-12 NOTE — ED Notes (Signed)
This RN to bedside due to patient calling out, pt states he feels like he is having a hard time breathing, pt attempts to demonstrate by breathing through his nose despite the mild nasal congestion. VSS, this RN explained to patient O2 sats 99-100% at this time. Pt also states when [place] is done he needs to use the bathroom, pt provided with urinal, pt states "I was hoping I could walk", this RN explained that if patient having increased SOB and blurred vision, it was not advised for patient to get up with those complaints and all of the monitoring equipment. [place] remains at bedside at this time performing [place].

## 2019-06-12 NOTE — ED Notes (Signed)
Pt presents to ED via POV with multiple medical complaints. Pt states 2 days noted knot to L arm that has since resolved, pt also c/o L sided CP that has since resolved. Pt also c/o R leg and foot numbness. Pt states "I want an EKG, I mean that thing that pregnant women get". Pt states L sided chest pain, no radiation, states pain has resolved. Pt also c/o L thigh pain, pt then c/o R leg numbness that radiates down to his foot.

## 2019-06-12 NOTE — ED Notes (Signed)
Pt provided with meal tray at this time per his request with MD permission. Pt visualized in NAD at this time. Continues to rest in bed. Call bell within reach. NAD noted. Pt watching TV and playing on personal cell phone.

## 2019-06-12 NOTE — ED Notes (Signed)
Pt now c/o new onset substernal CP. Pt visualized in bed in NAD at this time. Repeat EKG obtained by this RN and EDP made aware and given repeat EKG.

## 2019-06-12 NOTE — ED Notes (Signed)
Pt verbalized understanding of discharge instructions. NAD at this time. 

## 2019-06-12 NOTE — ED Notes (Signed)
This RN to bedside, pt states "well what causes blurry vision like sudden onset". This RN spoke with patient regarding his new complaint. Pt A&O x 4, neurologically intact. EDP made aware of patient's new complaint. [place] at bedside for [place] of LUE at this time. Medications administered per EDP order, pt refused tylenol due to allergy in past to Vicodin. EDP made aware. VSS.

## 2019-07-16 ENCOUNTER — Emergency Department: Payer: Self-pay

## 2019-07-16 ENCOUNTER — Other Ambulatory Visit: Payer: Self-pay

## 2019-07-16 ENCOUNTER — Emergency Department
Admission: EM | Admit: 2019-07-16 | Discharge: 2019-07-16 | Disposition: A | Discharge status: Home / Self Care | Payer: Self-pay | Attending: Emergency Medicine | Admitting: Emergency Medicine

## 2019-07-16 DIAGNOSIS — F17210 Nicotine dependence, cigarettes, uncomplicated: Secondary | ICD-10-CM | POA: Insufficient documentation

## 2019-07-16 DIAGNOSIS — Z79899 Other long term (current) drug therapy: Secondary | ICD-10-CM | POA: Insufficient documentation

## 2019-07-16 DIAGNOSIS — J069 Acute upper respiratory infection, unspecified: Secondary | ICD-10-CM | POA: Insufficient documentation

## 2019-07-16 DIAGNOSIS — Z20822 Contact with and (suspected) exposure to covid-19: Secondary | ICD-10-CM | POA: Insufficient documentation

## 2019-07-16 LAB — CBC WITH DIFFERENTIAL/PLATELET
Abs Immature Granulocytes: 0.01 10*3/uL (ref 0.00–0.07)
Basophils Absolute: 0.1 10*3/uL (ref 0.0–0.1)
Basophils Relative: 1 %
Eosinophils Absolute: 0.2 10*3/uL (ref 0.0–0.5)
Eosinophils Relative: 3 %
HCT: 45 % (ref 39.0–52.0)
Hemoglobin: 14.9 g/dL (ref 13.0–17.0)
Immature Granulocytes: 0 %
Lymphocytes Relative: 34 %
Lymphs Abs: 2.6 10*3/uL (ref 0.7–4.0)
MCH: 28.9 pg (ref 26.0–34.0)
MCHC: 33.1 g/dL (ref 30.0–36.0)
MCV: 87.2 fL (ref 80.0–100.0)
Monocytes Absolute: 0.6 10*3/uL (ref 0.1–1.0)
Monocytes Relative: 7 %
Neutro Abs: 4.2 10*3/uL (ref 1.7–7.7)
Neutrophils Relative %: 55 %
Platelets: 236 10*3/uL (ref 150–400)
RBC: 5.16 MIL/uL (ref 4.22–5.81)
RDW: 12.7 % (ref 11.5–15.5)
WBC: 7.6 10*3/uL (ref 4.0–10.5)
nRBC: 0 % (ref 0.0–0.2)

## 2019-07-16 LAB — COMPREHENSIVE METABOLIC PANEL
ALT: 37 U/L (ref 0–44)
AST: 26 U/L (ref 15–41)
Albumin: 4.2 g/dL (ref 3.5–5.0)
Alkaline Phosphatase: 65 U/L (ref 38–126)
Anion gap: 6 (ref 5–15)
BUN: 13 mg/dL (ref 6–20)
CO2: 27 mmol/L (ref 22–32)
Calcium: 8.8 mg/dL — ABNORMAL LOW (ref 8.9–10.3)
Chloride: 106 mmol/L (ref 98–111)
Creatinine, Ser: 0.99 mg/dL (ref 0.61–1.24)
GFR calc Af Amer: >60 mL/min (ref >60)
GFR calc non Af Amer: >60 mL/min (ref >60)
Glucose, Bld: 118 mg/dL — ABNORMAL HIGH (ref 70–99)
Potassium: 3.9 mmol/L (ref 3.5–5.1)
Sodium: 139 mmol/L (ref 135–145)
Total Bilirubin: 0.7 mg/dL (ref 0.3–1.2)
Total Protein: 7.2 g/dL (ref 6.5–8.1)

## 2019-07-16 NOTE — ED Notes (Signed)
Pt refused covid test

## 2019-07-16 NOTE — ED Provider Notes (Signed)
Emergency Department Provider Note  ____________________________________________  Time seen: Approximately 9:54 PM  I have reviewed the triage vital signs and the nursing notes.   HISTORY  Chief Complaint Shortness of Breath   Historian Patient    HPI Patrick Huffman is a 29 y.o. male presents to the emergency department with 3 days of nonproductive cough, fatigue, mild shortness of breath and fever with.  Patient denies known contacts with COVID-19.  Denies emesis and diarrhea.  No current chest pain or chest tightness.  No other alleviating measures have been attempted.   Past Medical History:  Diagnosis Date  . Back pain   . Heart murmur   . Hernia, inguinal      Immunizations up to date:  Yes.     Past Medical History:  Diagnosis Date  . Back pain   . Heart murmur   . Hernia, inguinal     There are no problems to display for this patient.   No past surgical history on file.  Prior to Admission medications   Medication Sig Start Date End Date Taking? Authorizing Provider  albuterol (VENTOLIN HFA) 108 (90 Base) MCG/ACT inhaler Inhale 2 puffs into the lungs every 6 (six) hours as needed for wheezing or shortness of breath. 06/12/19   Concha Se, MD  brompheniramine-pseudoephedrine-DM 30-2-10 MG/5ML syrup Take 10 mLs by mouth 4 (four) times daily as needed. Patient not taking: Reported on 11/26/2018 01/27/16   Cuthriell, Delorise Royals, PA-C  cetirizine (ZYRTEC) 10 MG tablet Take 1 tablet (10 mg total) by mouth daily. Patient not taking: Reported on 11/26/2018 01/27/16   Cuthriell, Delorise Royals, PA-C  dicyclomine (BENTYL) 20 MG tablet Take 1 tablet (20 mg total) by mouth 3 (three) times daily as needed for spasms. 04/12/19 04/11/20  Minna Antis, MD  fluticasone (FLONASE) 50 MCG/ACT nasal spray Place 2 sprays daily into both nostrils. 02/15/17 02/15/18  Enid Derry, PA-C  loratadine (CLARITIN) 10 MG tablet Take 1 tablet (10 mg total) by mouth daily for 14  days. 09/13/18 09/27/18  Shaune Pollack, MD  meclizine (ANTIVERT) 25 MG tablet Take 1 tablet (25 mg total) by mouth 3 (three) times daily as needed for dizziness. Patient not taking: Reported on 11/26/2018 09/13/18   Shaune Pollack, MD  meloxicam (MOBIC) 15 MG tablet Take 1 tablet (15 mg total) by mouth daily. Patient not taking: Reported on 11/26/2018 11/24/17   Cuthriell, Delorise Royals, PA-C  oxyCODONE-acetaminophen (PERCOCET) 10-325 MG tablet Take 2 tablets by mouth every 6 (six) hours as needed for pain.    [provider]  promethazine (PHENERGAN) 25 MG tablet Take 1 tablet (25 mg total) by mouth every 6 (six) hours as needed for nausea or vomiting. Patient not taking: Reported on 11/26/2018 05/07/16   Evangeline Dakin, PA-C    Allergies Vicodin [hydrocodone-acetaminophen] and Prednisone  No family history on file.  Social History Social History   Tobacco Use  . Smoking status: Current Every Day Smoker    Packs/day: 0.50    Types: Cigarettes  . Smokeless tobacco: Never Used  Substance Use Topics  . Alcohol use: No  . Drug use: No     Review of Systems  Constitutional: Patient has fever.  Eyes:  No discharge ENT: Patient has nasal congestion. Respiratory: Patient has cough. Gastrointestinal:   No nausea, no vomiting.  No diarrhea.  No constipation. Musculoskeletal: Negative for musculoskeletal pain. Skin: Negative for rash, abrasions, lacerations, ecchymosis.   ____________________________________________   PHYSICAL EXAM:  VITAL SIGNS:  ED Triage Vitals  Enc Vitals Group     BP 07/16/19 2039 108/63     Pulse Rate 07/16/19 2039 83     Resp 07/16/19 2039 18     Temp 07/16/19 2039 98 F (36.7 C)     Temp Source 07/16/19 2039 Oral     SpO2 07/16/19 2039 98 %     Weight 07/16/19 2040 200 lb (90.7 kg)     Height 07/16/19 2040 6' (1.829 m)     Head Circumference --      Peak Flow --      Pain Score 07/16/19 2040 0     Pain Loc --      Pain Edu? --      Excl. in  GC? --     Constitutional: Alert and oriented. Patient is lying supine. Eyes: Conjunctivae are normal. PERRL. EOMI. Head: Atraumatic. ENT:      Ears: Tympanic membranes are mildly injected with mild effusion bilaterally.       Nose: No congestion/rhinnorhea.      Mouth/Throat: Mucous membranes are moist. Posterior pharynx is mildly erythematous.  Hematological/Lymphatic/Immunilogical: No cervical lymphadenopathy.  Cardiovascular: Normal rate, regular rhythm. Normal S1 and S2.  Good peripheral circulation. Respiratory: Normal respiratory effort without tachypnea or retractions. Lungs CTAB. Good air entry to the bases with no decreased or absent breath sounds. Gastrointestinal: Bowel sounds 4 quadrants. Soft and nontender to palpation. No guarding or rigidity. No palpable masses. No distention. No CVA tenderness. Musculoskeletal: Full range of motion to all extremities. No gross deformities appreciated. Neurologic:  Normal speech and language. No gross focal neurologic deficits are appreciated.  Skin:  Skin is warm, dry and intact. No rash noted. Psychiatric: Mood and affect are normal. Speech and behavior are normal. Patient exhibits appropriate insight and judgement.    ____________________________________________   LABS (all labs ordered are listed, but only abnormal results are displayed)  Labs Reviewed  COMPREHENSIVE METABOLIC PANEL - Abnormal; Notable for the following components:      Result Value   Glucose, Bld 118 (*)    Calcium 8.8 (*)    All other components within normal limits  SARS CORONAVIRUS 2 (TAT 6-24 HRS)  CBC WITH DIFFERENTIAL/PLATELET   ____________________________________________  EKG   ____________________________________________  RADIOLOGY Geraldo Pitter, personally viewed and evaluated these images (plain radiographs) as part of my medical decision making, as well as reviewing the written report by the radiologist.  DG Chest 2 View  Result  Date: 07/16/2019 CLINICAL DATA:  Shortness of breath EXAM: CHEST - 2 VIEW COMPARISON:  None. FINDINGS: The heart size and mediastinal contours are within normal limits. Both lungs are clear. No pleural effusion or pneumothorax. The visualized skeletal structures are unremarkable. IMPRESSION: No acute process in the chest. Electronically Signed   By: Guadlupe Spanish M.D.   On: 07/16/2019 21:06    ____________________________________________    PROCEDURES  Procedure(s) performed:     Procedures     Medications - No data to display   ____________________________________________   INITIAL IMPRESSION / ASSESSMENT AND PLAN / ED COURSE  Pertinent labs & imaging results that were available during my care of the patient were reviewed by me and considered in my medical decision making (see chart for details).      Assessment and plan Viral URI with cough 29 year old male presents to the emergency department with cough, fatigue and fever for the past 3 days.  Vital signs are reassuring at triage.  CBC and  CMP were reassuring.  Chest x-ray revealed no opacities, consolidations or infiltrates that would suggest community-acquired pneumonia.  Send off COVID-19 testing is in process at this time.  Return precautions were given to return with new or worsening symptoms.  Tylenol and ibuprofen alternating were recommended for fever.    ____________________________________________  FINAL CLINICAL IMPRESSION(S) / ED DIAGNOSES  Final diagnoses:  Viral URI with cough      NEW MEDICATIONS STARTED DURING THIS VISIT:  ED Discharge Orders    None          This chart was dictated using voice recognition software/Dragon. Despite best efforts to proofread, errors can occur which can change the meaning. Any change was purely unintentional.     Karren Cobble 07/16/19 2156    Nance Pear, MD 07/16/19 2221

## 2019-07-16 NOTE — ED Triage Notes (Signed)
Patient reports symptoms for 3 days, started with weakness, then cough, now feeling very tired.

## 2019-09-28 ENCOUNTER — Emergency Department: Payer: Self-pay

## 2019-09-28 ENCOUNTER — Encounter: Payer: Self-pay | Admitting: Emergency Medicine

## 2019-09-28 ENCOUNTER — Other Ambulatory Visit: Payer: Self-pay

## 2019-09-28 ENCOUNTER — Emergency Department
Admission: EM | Admit: 2019-09-28 | Discharge: 2019-09-28 | Disposition: A | Discharge status: Home / Self Care | Payer: Self-pay | Attending: Emergency Medicine | Admitting: Emergency Medicine

## 2019-09-28 DIAGNOSIS — R05 Cough: Secondary | ICD-10-CM | POA: Insufficient documentation

## 2019-09-28 DIAGNOSIS — J029 Acute pharyngitis, unspecified: Secondary | ICD-10-CM | POA: Insufficient documentation

## 2019-09-28 NOTE — ED Provider Notes (Signed)
Patient evidently left without being seen.  Patient presented to the emergency department, was triaged, had chest x-ray performed.  Patient's chart was placed in my room,I assigned myself the patient but when ED tech attempted to locate the patient evidently he had told the front desk he was leaving.  I have not seen or assessed the patient at this time.   Anmol, Fleck 09/28/19 1846    Phineas Semen, MD 09/28/19 2002

## 2019-09-28 NOTE — ED Notes (Signed)
Pt reported to front desk that he was leaving 

## 2019-09-28 NOTE — ED Triage Notes (Signed)
Pt reports productive cough and sore throat for a few days, denies fevers.

## 2019-10-01 ENCOUNTER — Emergency Department
Admission: EM | Admit: 2019-10-01 | Discharge: 2019-10-01 | Disposition: A | Discharge status: Home / Self Care | Payer: Self-pay | Attending: Emergency Medicine | Admitting: Emergency Medicine

## 2019-10-01 ENCOUNTER — Other Ambulatory Visit: Payer: Self-pay

## 2019-10-01 ENCOUNTER — Encounter: Payer: Self-pay | Admitting: Emergency Medicine

## 2019-10-01 ENCOUNTER — Emergency Department: Payer: Self-pay

## 2019-10-01 DIAGNOSIS — R0602 Shortness of breath: Secondary | ICD-10-CM | POA: Insufficient documentation

## 2019-10-01 DIAGNOSIS — Z5321 Procedure and treatment not carried out due to patient leaving prior to being seen by health care provider: Secondary | ICD-10-CM | POA: Insufficient documentation

## 2019-10-01 DIAGNOSIS — R0789 Other chest pain: Secondary | ICD-10-CM | POA: Insufficient documentation

## 2019-10-01 DIAGNOSIS — R05 Cough: Secondary | ICD-10-CM | POA: Insufficient documentation

## 2019-10-01 LAB — CBC
HCT: 45.6 % (ref 39.0–52.0)
Hemoglobin: 14.8 g/dL (ref 13.0–17.0)
MCH: 28.6 pg (ref 26.0–34.0)
MCHC: 32.5 g/dL (ref 30.0–36.0)
MCV: 88 fL (ref 80.0–100.0)
Platelets: 284 10*3/uL (ref 150–400)
RBC: 5.18 MIL/uL (ref 4.22–5.81)
RDW: 12.8 % (ref 11.5–15.5)
WBC: 9.5 10*3/uL (ref 4.0–10.5)
nRBC: 0 % (ref 0.0–0.2)

## 2019-10-01 LAB — BASIC METABOLIC PANEL
Anion gap: 10 (ref 5–15)
BUN: 11 mg/dL (ref 6–20)
CO2: 27 mmol/L (ref 22–32)
Calcium: 9.1 mg/dL (ref 8.9–10.3)
Chloride: 104 mmol/L (ref 98–111)
Creatinine, Ser: 1.03 mg/dL (ref 0.61–1.24)
GFR calc Af Amer: >60 mL/min (ref >60)
GFR calc non Af Amer: >60 mL/min (ref >60)
Glucose, Bld: 137 mg/dL — ABNORMAL HIGH (ref 70–99)
Potassium: 3.6 mmol/L (ref 3.5–5.1)
Sodium: 141 mmol/L (ref 135–145)

## 2019-10-01 LAB — TROPONIN I (HIGH SENSITIVITY): Troponin I (High Sensitivity): 5 ng/L (ref <18)

## 2019-10-01 NOTE — ED Triage Notes (Signed)
Patient with complaint of cough times 5 days. Patient now with complaint of chest pressure and shortness of breath that started yesterday.

## 2019-10-05 ENCOUNTER — Emergency Department
Admission: EM | Admit: 2019-10-05 | Discharge: 2019-10-05 | Disposition: A | Discharge status: Home / Self Care | Payer: Self-pay | Attending: Emergency Medicine | Admitting: Emergency Medicine

## 2019-10-05 ENCOUNTER — Other Ambulatory Visit: Payer: Self-pay

## 2019-10-05 DIAGNOSIS — T63441A Toxic effect of venom of bees, accidental (unintentional), initial encounter: Secondary | ICD-10-CM | POA: Insufficient documentation

## 2019-10-05 DIAGNOSIS — Y929 Unspecified place or not applicable: Secondary | ICD-10-CM | POA: Insufficient documentation

## 2019-10-05 DIAGNOSIS — W57XXXA Bitten or stung by nonvenomous insect and other nonvenomous arthropods, initial encounter: Secondary | ICD-10-CM | POA: Insufficient documentation

## 2019-10-05 DIAGNOSIS — R2232 Localized swelling, mass and lump, left upper limb: Secondary | ICD-10-CM | POA: Insufficient documentation

## 2019-10-05 DIAGNOSIS — Y999 Unspecified external cause status: Secondary | ICD-10-CM | POA: Insufficient documentation

## 2019-10-05 DIAGNOSIS — Y939 Activity, unspecified: Secondary | ICD-10-CM | POA: Insufficient documentation

## 2019-10-05 DIAGNOSIS — F17210 Nicotine dependence, cigarettes, uncomplicated: Secondary | ICD-10-CM | POA: Insufficient documentation

## 2019-10-05 MED ORDER — FLUTICASONE PROPIONATE 50 MCG/ACT NA SUSP: 2.0000 | Freq: Every day | NASAL | 0 refills | Status: DC

## 2019-10-05 MED ORDER — FAMOTIDINE 20 MG PO TABS: 20.0000 mg | ORAL_TABLET | Freq: Two times a day (BID) | ORAL | 0 refills | Status: DC

## 2019-10-05 MED ORDER — EPINEPHRINE 0.3 MG/0.3ML IJ SOAJ: 0.3000 mg | Freq: Once | INTRAMUSCULAR | 0 refills | Status: AC

## 2019-10-05 MED ORDER — DIPHENHYDRAMINE HCL 25 MG PO CAPS
25.0000 mg | ORAL_CAPSULE | Freq: Once | ORAL | Status: AC
  Administered 2019-10-05: 25 mg via ORAL
  Filled 2019-10-05: qty 1

## 2019-10-05 NOTE — ED Triage Notes (Addendum)
Pt here with a bee sting to his left arm that is swollen. No tongue swelling but unable to move fingers. Pt NAD in triage.

## 2019-10-05 NOTE — ED Notes (Signed)
Pt st feeling "drowsy" at this time. Denies SHOB/Cp. NAd noted at this time.

## 2019-10-05 NOTE — Discharge Instructions (Signed)
Take the prescription meds as directed. OTC Benadryl as needed. Apply ice to reduce swelling. Use the Epi-Pen for severe reactions, as directed. Report to the ED if you have to use the Epi-Pen.

## 2019-10-06 NOTE — ED Provider Notes (Signed)
[facility] Emergency Department Provider Note ____________________________________________  Time seen: 1649  I have reviewed the triage vital signs and the nursing notes.  HISTORY  Chief Complaint  Allergic Reaction  HPI Patrick Huffman is a 29 y.o. male presents himself to the ED for evaluation of a loss pain to the left hand and arm.   Patient describes any physical therapy or swollen.  He denies any tongue swelling, difficulty breathing, or shortness of breath.  He reports at least 2 stains of the left arm.  Patient has been given Benadryl at triage.  He reports movement of his pain at this time.  He does give a remote history of what sounds like anaphylaxis following a bee sting several years ago.  He has not been prescribed an EpiPen in the past, and denies any current use of an EpiPen.  Past Medical History:  Diagnosis Date  . Back pain   . Heart murmur   . Hernia, inguinal     There are no problems to display for this patient.   No past surgical history on file.  Prior to Admission medications   Medication Sig Start Date End Date Taking? Authorizing Provider  albuterol (VENTOLIN HFA) 108 (90 Base) MCG/ACT inhaler Inhale 2 puffs into the lungs every 6 (six) hours as needed for wheezing or shortness of breath. 06/12/19   Vanessa [place], MD  famotidine (PEPCID) 20 MG tablet Take 1 tablet (20 mg total) by mouth 2 (two) times daily for 7 days. 10/05/19 10/12/19  Shemar Plemmons, Dannielle Karvonen, PA-C  fluticasone (FLONASE) 50 MCG/ACT nasal spray Place 2 sprays into both nostrils daily. 10/05/19 11/04/19  Marthena Whitmyer, Dannielle Karvonen, PA-C  cetirizine (ZYRTEC) 10 MG tablet Take 1 tablet (10 mg total) by mouth daily. Patient not taking: Reported on 11/26/2018 01/27/16 10/05/19  Cuthriell, Charline Bills, PA-C  dicyclomine (BENTYL) 20 MG tablet Take 1 tablet (20 mg total) by mouth 3 (three) times daily as needed for spasms. 04/12/19 10/05/19  Harvest Dark, MD  loratadine  (CLARITIN) 10 MG tablet Take 1 tablet (10 mg total) by mouth daily for 14 days. 09/13/18 10/05/19  Duffy Bruce, MD  promethazine (PHENERGAN) 25 MG tablet Take 1 tablet (25 mg total) by mouth every 6 (six) hours as needed for nausea or vomiting. Patient not taking: Reported on 11/26/2018 05/07/16 10/05/19  Arlyss Repress, PA-C    Allergies Tramadol, Vicodin [hydrocodone-acetaminophen], and Prednisone  No family history on file.  Social History Social History   Tobacco Use  . Smoking status: Current Every Day Smoker    Packs/day: 0.50    Types: Cigarettes  . Smokeless tobacco: Never Used  Vaping Use  . Vaping Use: Never used  Substance Use Topics  . Alcohol use: Yes  . Drug use: No    Review of Systems  Constitutional: Negative for fever. Eyes: Negative for visual changes. ENT: Negative for sore throat. Cardiovascular: Negative for chest pain. Respiratory: Negative for shortness of breath. Gastrointestinal: Negative for abdominal pain, vomiting and diarrhea. Genitourinary: Negative for dysuria. Musculoskeletal: Negative for back pain. Skin: Negative for rash.  Hand swelling secondary to bee sting. Neurological: Negative for headaches, focal weakness or numbness. ____________________________________________  PHYSICAL EXAM:  VITAL SIGNS: ED Triage Vitals [10/05/19 1537]  Enc Vitals Group     BP 114/84     Pulse Rate 82     Resp 16     Temp 97.9 F (36.6 C)     Temp Source Oral  SpO2 98 %     Weight 200 lb (90.7 kg)     Height 6' (1.829 m)     Head Circumference      Peak Flow      Pain Score 10     Pain Loc      Pain Edu?      Excl. in GC?     Constitutional: Alert and oriented. Well appearing and in no distress. Head: Normocephalic and atraumatic. Eyes: Conjunctivae are normal.  Normal extraocular movements Ears: Canals clear. TMs intact bilaterally. Nose: No congestion/rhinorrhea/epistaxis. Mouth/Throat: Mucous membranes are moist.  Uvula is midline  and tonsils are flat.  No oropharyngeal lesions are appreciated. Neck: Supple. No thyromegaly. Cardiovascular: Normal rate, regular rhythm. Normal distal pulses. Respiratory: Normal respiratory effort. No wheezes/rales/rhonchi. Musculoskeletal: Normal composite fist on the left hand.  Some edema and erythema to the dorsal hand consistent with a recent bee sting reaction.  Nontender with normal range of motion in all extremities.  Neurologic:  Normal gait without ataxia. Normal speech and language. No gross focal neurologic deficits are appreciated. Skin:  Skin is warm, dry and intact. No rash noted.  Psychiatric: Mood and affect are normal. Patient exhibits appropriate insight and judgment. ____________________________________________  PROCEDURES  Benadryl 25 mg p.o.  Procedures ____________________________________________  INITIAL IMPRESSION / ASSESSMENT AND PLAN / ED COURSE  DDX: allergic reaction, cellulitis, anaphylaxis  Patient with ED evaluation of hand swelling and pain secondary to a recent bee sting.  Patient without any signs of anaphylaxis on presentation.  He presents with local reaction secondary to the bee sting.  He has been treated with antihistamines and reports improvement of his symptoms at the time of this evaluation.  Given his remote history, he will be provided with an EpiPen prescription.  He is also given a prescription for famotidine and Flonase.  He will take these medications along with Benadryl for continued symptom relief.  Patient is discharged at this time to follow-up with primary provider return to the ED as needed.  Patrick Huffman was evaluated in Emergency Department on 10/06/2019 for the symptoms described in the history of present illness. He was evaluated in the context of the global COVID-19 pandemic, which necessitated consideration that the patient might be at risk for infection with the SARS-CoV-2 virus that causes COVID-19. Institutional protocols  and algorithms that pertain to the evaluation of patients at risk for COVID-19 are in a state of rapid change based on information released by regulatory bodies including the CDC and federal and state organizations. These policies and algorithms were followed during the patient's care in the ED. ____________________________________________  FINAL CLINICAL IMPRESSION(S) / ED DIAGNOSES  Final diagnoses:  Bee sting reaction, accidental or unintentional, initial encounter      Lissa Hoard, PA-C 10/06/19 2259    Phineas Semen, MD 10/06/19 2304

## 2019-10-10 ENCOUNTER — Encounter: Payer: Self-pay | Admitting: Emergency Medicine

## 2019-10-10 ENCOUNTER — Emergency Department: Payer: Self-pay

## 2019-10-10 ENCOUNTER — Other Ambulatory Visit: Payer: Self-pay

## 2019-10-10 ENCOUNTER — Emergency Department
Admission: EM | Admit: 2019-10-10 | Discharge: 2019-10-10 | Disposition: A | Discharge status: Home / Self Care | Payer: Self-pay | Attending: Emergency Medicine | Admitting: Emergency Medicine

## 2019-10-10 DIAGNOSIS — Z5321 Procedure and treatment not carried out due to patient leaving prior to being seen by health care provider: Secondary | ICD-10-CM | POA: Insufficient documentation

## 2019-10-10 DIAGNOSIS — R519 Headache, unspecified: Secondary | ICD-10-CM | POA: Insufficient documentation

## 2019-10-10 LAB — GLUCOSE, CAPILLARY: Glucose-Capillary: 92 mg/dL (ref 70–99)

## 2019-10-10 NOTE — ED Notes (Signed)
Pt to STAT desk. States he is flying to [place] and needs to be a the airport by 5 am and has to leave now to make it on time.

## 2019-10-10 NOTE — ED Triage Notes (Signed)
Pt presents to ED with sudden onset of sharp pain to the right side of his head around 2330. Pt states he felt very hot at the onset of his symptoms. Pt reports having a similar headache Sunday night followed by blurred vision that lasted approx 15-30 minutes.

## 2019-10-29 ENCOUNTER — Emergency Department: Payer: Self-pay

## 2019-10-29 ENCOUNTER — Encounter: Payer: Self-pay | Admitting: Emergency Medicine

## 2019-10-29 ENCOUNTER — Emergency Department
Admission: EM | Admit: 2019-10-29 | Discharge: 2019-10-29 | Disposition: A | Discharge status: Home / Self Care | Payer: Self-pay | Attending: Emergency Medicine | Admitting: Emergency Medicine

## 2019-10-29 ENCOUNTER — Other Ambulatory Visit: Payer: Self-pay

## 2019-10-29 DIAGNOSIS — R0789 Other chest pain: Secondary | ICD-10-CM | POA: Insufficient documentation

## 2019-10-29 DIAGNOSIS — F17210 Nicotine dependence, cigarettes, uncomplicated: Secondary | ICD-10-CM | POA: Insufficient documentation

## 2019-10-29 DIAGNOSIS — R202 Paresthesia of skin: Secondary | ICD-10-CM | POA: Insufficient documentation

## 2019-10-29 DIAGNOSIS — R200 Anesthesia of skin: Secondary | ICD-10-CM | POA: Insufficient documentation

## 2019-10-29 DIAGNOSIS — D649 Anemia, unspecified: Secondary | ICD-10-CM | POA: Insufficient documentation

## 2019-10-29 DIAGNOSIS — R079 Chest pain, unspecified: Secondary | ICD-10-CM

## 2019-10-29 LAB — COMPREHENSIVE METABOLIC PANEL
ALT: 41 U/L (ref 0–44)
AST: 27 U/L (ref 15–41)
Albumin: 4.2 g/dL (ref 3.5–5.0)
Alkaline Phosphatase: 58 U/L (ref 38–126)
Anion gap: 11 (ref 5–15)
BUN: 14 mg/dL (ref 6–20)
CO2: 24 mmol/L (ref 22–32)
Calcium: 8.9 mg/dL (ref 8.9–10.3)
Chloride: 102 mmol/L (ref 98–111)
Creatinine, Ser: 1.08 mg/dL (ref 0.61–1.24)
GFR calc Af Amer: >60 mL/min (ref >60)
GFR calc non Af Amer: >60 mL/min (ref >60)
Glucose, Bld: 104 mg/dL — ABNORMAL HIGH (ref 70–99)
Potassium: 3.7 mmol/L (ref 3.5–5.1)
Sodium: 137 mmol/L (ref 135–145)
Total Bilirubin: 0.7 mg/dL (ref 0.3–1.2)
Total Protein: 7.5 g/dL (ref 6.5–8.1)

## 2019-10-29 LAB — URINE DRUG SCREEN, QUALITATIVE (ARMC ONLY)
Amphetamines, Ur Screen: NOT DETECTED
Barbiturates, Ur Screen: NOT DETECTED
Benzodiazepine, Ur Scrn: NOT DETECTED
Cannabinoid 50 Ng, Ur ~~LOC~~: NOT DETECTED
Cocaine Metabolite,Ur ~~LOC~~: NOT DETECTED
MDMA (Ecstasy)Ur Screen: NOT DETECTED
Methadone Scn, Ur: NOT DETECTED
Opiate, Ur Screen: POSITIVE — AB
Phencyclidine (PCP) Ur S: NOT DETECTED
Tricyclic, Ur Screen: NOT DETECTED

## 2019-10-29 LAB — CBC WITH DIFFERENTIAL/PLATELET
Abs Immature Granulocytes: 0.03 10*3/uL (ref 0.00–0.07)
Basophils Absolute: 0.1 10*3/uL (ref 0.0–0.1)
Basophils Relative: 1 %
Eosinophils Absolute: 0.2 10*3/uL (ref 0.0–0.5)
Eosinophils Relative: 2 %
HCT: 45.5 % (ref 39.0–52.0)
Hemoglobin: 15.4 g/dL (ref 13.0–17.0)
Immature Granulocytes: 0 %
Lymphocytes Relative: 38 %
Lymphs Abs: 3.7 10*3/uL (ref 0.7–4.0)
MCH: 29.1 pg (ref 26.0–34.0)
MCHC: 33.8 g/dL (ref 30.0–36.0)
MCV: 85.8 fL (ref 80.0–100.0)
Monocytes Absolute: 0.5 10*3/uL (ref 0.1–1.0)
Monocytes Relative: 5 %
Neutro Abs: 5.2 10*3/uL (ref 1.7–7.7)
Neutrophils Relative %: 54 %
Platelets: 187 10*3/uL (ref 150–400)
RBC: 5.3 MIL/uL (ref 4.22–5.81)
RDW: 13 % (ref 11.5–15.5)
WBC: 9.7 10*3/uL (ref 4.0–10.5)
nRBC: 0 % (ref 0.0–0.2)

## 2019-10-29 LAB — GLUCOSE, CAPILLARY: Glucose-Capillary: 90 mg/dL (ref 70–99)

## 2019-10-29 LAB — TROPONIN I (HIGH SENSITIVITY)
Troponin I (High Sensitivity): 4 ng/L (ref <18)
Troponin I (High Sensitivity): 4 ng/L (ref <18)

## 2019-10-29 LAB — ETHANOL: Alcohol, Ethyl (B): <10 mg/dL (ref <10)

## 2019-10-29 MED ORDER — ONDANSETRON HCL 4 MG/2ML IJ SOLN
4.0000 mg | Freq: Once | INTRAMUSCULAR | Status: AC
  Administered 2019-10-29: 4 mg via INTRAVENOUS
  Filled 2019-10-29: qty 2

## 2019-10-29 MED ORDER — IOHEXOL 350 MG/ML SOLN
100.0000 mL | Freq: Once | INTRAVENOUS | Status: AC | PRN
  Administered 2019-10-29: 100 mL via INTRAVENOUS

## 2019-10-29 MED ORDER — FENTANYL CITRATE (PF) 100 MCG/2ML IJ SOLN
75.0000 ug | Freq: Once | INTRAMUSCULAR | Status: AC
  Administered 2019-10-29: 75 ug via INTRAVENOUS
  Filled 2019-10-29: qty 2

## 2019-10-29 MED ORDER — ACETAMINOPHEN 500 MG PO TABS
1000.0000 mg | ORAL_TABLET | Freq: Once | ORAL | Status: AC
  Administered 2019-10-29: 1000 mg via ORAL
  Filled 2019-10-29: qty 2

## 2019-10-29 NOTE — ED Notes (Signed)
Pt taken to CT, girlfriend at bedside.

## 2019-10-29 NOTE — Discharge Instructions (Addendum)
Your work-up was reassuring.  Your second heart marker was negative.  Your CT scans were negative.  You should get a primary care doctor for follow-up.  You can also call the cardiology number to get follow-up.  Return to ER if develop worsening symptoms or any other concerns

## 2019-10-29 NOTE — ED Triage Notes (Signed)
Pt to ED via POV c/o left sided chest pain. Pt states that it started about 10-20 minutes PTA. Pt states that his left side is numb as well. Pt is hyperventilating in triage and upset because his support person was not allowed in.

## 2019-10-29 NOTE — ED Provider Notes (Signed)
[facility] Emergency Department Provider Note  ____________________________________________   First MD Initiated Contact with Patient 10/29/19 [phone]     (approximate)  I have reviewed the triage vital signs and the nursing notes.   HISTORY  Chief Complaint Chest Pain    HPI Patrick Huffman is a 29 y.o. male with hypertension who comes in with chest pain.  Patient reports severe onset of chest pain that started at 730 that radiated into his back and he had left arm tingling sensation.  The tingling went away.  His chest pain has now become more moderate but still there.  It is sharp, nothing makes it better, nothing makes it worse.  He reports that he now has some sensation changes on his bilateral knees and below. Describes them as feeling cold on the inside but hot on the outside. He denies any weakness is still able to ambulate.  He states that initially he felt like he was hyperventilating but now he feels like his breathing is back to normal.  He states that he has had some chest pain previously but never had this tingling before.  He reports that he had a stress test previously that was normal.  He denies any IV drug use, meth or cocaine use.  States that he was not able to sleep all night.  He denies feeling anxious.  He denies any recent trauma or neck manipulation.  Denies any neck pain.          Past Medical History:  Diagnosis Date  . Back pain   . Heart murmur   . Hernia, inguinal     There are no problems to display for this patient.   History reviewed. No pertinent surgical history.  Prior to Admission medications   Medication Sig Start Date End Date Taking? Authorizing Provider  albuterol (VENTOLIN HFA) 108 (90 Base) MCG/ACT inhaler Inhale 2 puffs into the lungs every 6 (six) hours as needed for wheezing or shortness of breath. 06/12/19   Concha Se, MD  famotidine (PEPCID) 20 MG tablet Take 1 tablet (20 mg total) by mouth 2 (two)  times daily for 7 days. 10/05/19 10/12/19  Menshew, Charlesetta Ivory, PA-C  fluticasone (FLONASE) 50 MCG/ACT nasal spray Place 2 sprays into both nostrils daily. 10/05/19 11/04/19  Menshew, Charlesetta Ivory, PA-C  cetirizine (ZYRTEC) 10 MG tablet Take 1 tablet (10 mg total) by mouth daily. Patient not taking: Reported on 11/26/2018 01/27/16 10/05/19  Cuthriell, Delorise Royals, PA-C  dicyclomine (BENTYL) 20 MG tablet Take 1 tablet (20 mg total) by mouth 3 (three) times daily as needed for spasms. 04/12/19 10/05/19  Minna Antis, MD  loratadine (CLARITIN) 10 MG tablet Take 1 tablet (10 mg total) by mouth daily for 14 days. 09/13/18 10/05/19  Shaune Pollack, MD  promethazine (PHENERGAN) 25 MG tablet Take 1 tablet (25 mg total) by mouth every 6 (six) hours as needed for nausea or vomiting. Patient not taking: Reported on 11/26/2018 05/07/16 10/05/19  Evangeline Dakin, PA-C    Allergies Tramadol, Vicodin [hydrocodone-acetaminophen], and Prednisone  No family history on file.  Social History Social History   Tobacco Use  . Smoking status: Current Every Day Smoker    Packs/day: 0.50    Types: Cigarettes  . Smokeless tobacco: Never Used  Vaping Use  . Vaping Use: Never used  Substance Use Topics  . Alcohol use: Yes  . Drug use: No      Review of Systems Constitutional: No  fever/chills Eyes: No visual changes. ENT: No sore throat. Cardiovascular: Positive chest pain Respiratory: Denies shortness of breath. Gastrointestinal: No abdominal pain.  No nausea, no vomiting.  No diarrhea.  No constipation. Genitourinary: Negative for dysuria. Musculoskeletal: Negative for back pain. Skin: Negative for rash. Neurological: Negative for headaches, focal weakness.  Positive tingling All other ROS negative ____________________________________________   PHYSICAL EXAM:  VITAL SIGNS: ED Triage Vitals  Enc Vitals Group     BP 10/29/19 0809 122/87     Pulse Rate 10/29/19 0809 90     Resp 10/29/19 0809 16      Temp 10/29/19 0809 97.6 F (36.4 C)     Temp Source 10/29/19 0809 Oral     SpO2 10/29/19 0809 100 %     Weight --      Height --      Head Circumference --      Peak Flow --      Pain Score 10/29/19 0810 0     Pain Loc --      Pain Edu? --      Excl. in GC? --     Constitutional: Alert and oriented. Well appearing and in no acute distress. Eyes: Conjunctivae are normal. EOMI. Head: Atraumatic. Nose: No congestion/rhinnorhea. Mouth/Throat: Mucous membranes are moist.   Neck: No stridor. Trachea Midline. FROM Cardiovascular: Normal rate, regular rhythm. Grossly normal heart sounds.  Good peripheral circulation.  Chest wall tenderness Respiratory: Normal respiratory effort.  No retractions. Lungs CTAB. Gastrointestinal: Soft and nontender. No distention. No abdominal bruits.  Musculoskeletal: No lower extremity tenderness nor edema.  No joint effusions. Neurologic:  Normal speech and language.  Cranial 2-12 are intact.  Equal strength in arms and legs.  Reports a strange sensation in his knees down bilaterally.  Arm sensations are equal. Skin:  Skin is warm, dry and intact. No rash noted. Psychiatric: Mood and affect are normal. Speech and behavior are normal. GU: Deferred   ____________________________________________   LABS (all labs ordered are listed, but only abnormal results are displayed)  Labs Reviewed  COMPREHENSIVE METABOLIC PANEL - Abnormal; Notable for the following components:      Result Value   Glucose, Bld 104 (*)    All other components within normal limits  URINE DRUG SCREEN, QUALITATIVE ([facility] ONLY) - Abnormal; Notable for the following components:   Opiate, Ur Screen POSITIVE (*)    All other components within normal limits  GLUCOSE, CAPILLARY  CBC WITH DIFFERENTIAL/PLATELET  ETHANOL  TROPONIN I (HIGH SENSITIVITY)  TROPONIN I (HIGH SENSITIVITY)   ____________________________________________   ED ECG REPORT I, Concha Se, the attending  physician, personally viewed and interpreted this ECG.  Normal rate of 79, no ST elevation, T wave inversion in lead III, normal intervals ____________________________________________  RADIOLOGY   Official radiology report(s): CT Head Wo Contrast  Result Date: 10/29/2019 CLINICAL DATA:  Headache.  Normal neuro exam EXAM: CT HEAD WITHOUT CONTRAST TECHNIQUE: Contiguous axial images were obtained from the base of the skull through the vertex without intravenous contrast. COMPARISON:  CT head 10/10/2019 FINDINGS: Brain: No evidence of acute infarction, hemorrhage, hydrocephalus, extra-axial collection or mass lesion/mass effect. Vascular: Negative for hyperdense vessel Skull: Negative Sinuses/Orbits: Sinus mucosal disease most prominent left maxillary sinus. Negative orbit Other: None IMPRESSION: Negative CT of the brain Sinus mucosal disease. Electronically Signed   By: Marlan Palau M.D.   On: 10/29/2019 10:34   DG Chest Portable 1 View  Result Date: 10/29/2019 CLINICAL DATA:  Chest pain.  EXAM: PORTABLE CHEST 1 VIEW COMPARISON:  October 01, 2019. FINDINGS: The heart size and mediastinal contours are within normal limits. Both lungs are clear. No pneumothorax or pleural effusion is noted. The visualized skeletal structures are unremarkable. IMPRESSION: No active disease. Electronically Signed   By: Lupita Raider M.D.   On: 10/29/2019 09:24   CT Angio Chest/Abd/Pel for Dissection W and/or Wo Contrast  Result Date: 10/29/2019 CLINICAL DATA:  Nonspecific left-sided chest pain and numbness. EXAM: CT ANGIOGRAPHY CHEST, ABDOMEN AND PELVIS TECHNIQUE: Non-contrast CT of the chest was initially obtained. Multidetector CT imaging through the chest, abdomen and pelvis was performed using the standard protocol during bolus administration of intravenous contrast. Multiplanar reconstructed images and MIPs were obtained and reviewed to evaluate the vascular anatomy. CONTRAST:  OMNIPAQUE IOHEXOL 350 MG/ML SOLN  COMPARISON:  None. FINDINGS: CTA CHEST FINDINGS Cardiovascular: Satisfactory opacification of the pulmonary arteries to the segmental level. No evidence of pulmonary embolism. Normal heart size. No pericardial effusion. Mediastinum/Nodes: No enlarged mediastinal, hilar, or axillary lymph nodes. Thyroid gland, trachea, and esophagus demonstrate no significant findings. Lungs/Pleura: Lungs are clear. No pleural effusion or pneumothorax. Musculoskeletal: No chest wall abnormality. No acute or significant osseous findings. Review of the MIP images confirms the above findings. CTA ABDOMEN AND PELVIS FINDINGS VASCULAR Aorta: Normal caliber aorta without aneurysm, dissection, vasculitis or significant stenosis. Celiac: Patent without evidence of aneurysm, dissection, vasculitis or significant stenosis. SMA: Patent without evidence of aneurysm, dissection, vasculitis or significant stenosis. Renals: Both renal arteries are patent without evidence of aneurysm, dissection, vasculitis, fibromuscular dysplasia or significant stenosis. IMA: Patent without evidence of aneurysm, dissection, vasculitis or significant stenosis. Inflow: Patent without evidence of aneurysm, dissection, vasculitis or significant stenosis. Veins: No obvious venous abnormality within the limitations of this arterial phase study. Review of the MIP images confirms the above findings. NON-VASCULAR Hepatobiliary: No focal liver abnormality is seen. No gallstones, gallbladder wall thickening, or biliary dilatation. Pancreas: Unremarkable. No pancreatic ductal dilatation or surrounding inflammatory changes. Spleen: Normal in size without focal abnormality. Adrenals/Urinary Tract: Adrenal glands are unremarkable. Kidneys are normal, without renal calculi, focal lesion, or hydronephrosis. Bladder is unremarkable. Stomach/Bowel: Stomach is within normal limits. Appendix appears normal. No evidence of bowel wall thickening, distention, or inflammatory changes.  Lymphatic: No enlarged lymph nodes. Reproductive: Prostate is unremarkable. Other: No abdominal wall hernia or abnormality. No abdominopelvic ascites. Musculoskeletal: No acute or significant osseous findings. Review of the MIP images confirms the above findings. IMPRESSION: No evidence of pulmonary embolus or other acute abnormality within the chest, abdomen or pelvis. Electronically Signed   By: Ted Mcalpine M.D.   On: 10/29/2019 10:40    ____________________________________________   PROCEDURES  Procedure(s) performed (including Critical Care):  Procedures   ____________________________________________   INITIAL IMPRESSION / ASSESSMENT AND PLAN / ED COURSE   THIAGO RAGSDALE was evaluated in Emergency Department on 10/29/2019 for the symptoms described in the history of present illness. He was evaluated in the context of the global COVID-19 pandemic, which necessitated consideration that the patient might be at risk for infection with the SARS-CoV-2 virus that causes COVID-19. Institutional protocols and algorithms that pertain to the evaluation of patients at risk for COVID-19 are in a state of rapid change based on information released by regulatory bodies including the CDC and federal and state organizations. These policies and algorithms were followed during the patient's care in the ED.    Most Likely DDx:  -MSK (atypical chest pain) but will get cardiac  markers to evaluate for ACS given risk factors/age -Given the report of the numbness and tingling in the left arm and the legs will get CT dissection and CT head to evaluate for mass.  Denies any neck manipulation or neck pain to suggest carotid dissection.   DDx that was also considered d/t potential to cause harm, but was found less likely based on history and physical (as detailed above): -PNA (no fevers, cough but CXR to evaluate) -PNX (reassured with equal b/l breath sounds, CXR to evaluate) -Symptomatic anemia (will  get H&H) -Pulmonary embolism as no sob at rest, not pleuritic in nature, no hypoxia -Pericarditis no rub on exam, EKG changes or hx to suggest dx -Tamponade (no notable SOB, tachycardic, hypotensive) -Esophageal rupture (no h/o diffuse vomitting/no crepitus)  CT scan is negative of the chest and head  Labs are reassuring.  Patient's urine only positive for opioids which she states that he took a pain pill this morning.  He denies any other substance abuse.   Reevaluated patient and his pain is better.  He does report still some continued feeling in his knees down bilaterally where they feel cold on the inside and warm on the outside.  His sensation is equal bilaterally he is got equal strength he is able to ambulate around the room.  Unclear exactly what is causing the symptoms.  Patient asks if  this could be related to low oxygen levels.  Explained to patient that his oxygen levels are normal.  He is asking if any it could be related to blood clots in his chest.  I explained to him that the CT scan does not show any evidence of blood clots in his chest and he has no swelling in his legs to suggest blood clots in his legs.  We discussed the possibility of stroke and given MRI but it seems unlikely to be a stroke given the distribution of his symptoms and not really a numbness but more of this cold sensation.  We have elected to hold off on MRI.  Patient is very preoccupied and states that he has had this chest pain multiple times he is not sure why he is having it denies having these new symptoms with it and no one can figure this out.  Offered to give him cardiology follow-up patient expressed understanding.  Explained to patient that he is to follow with a primary care doctor to help get him established care.  Patient expressed understanding.  Discussed with patient he had one more cardiac marker come back before discharge.  Patient was found in the hallway wanting to leave and I explained that I  still needed his marker to come back.  The repeat marker was negative I will discharge patient at this time    I discussed the provisional nature of ED diagnosis, the treatment so far, the ongoing plan of care, follow up appointments and return precautions with the patient and any family or support people present. They expressed understanding and agreed with the plan, discharged home.     ____________________________________________   FINAL CLINICAL IMPRESSION(S) / ED DIAGNOSES   Final diagnoses:  Chest pain, unspecified type     MEDICATIONS GIVEN DURING THIS VISIT:  Medications  acetaminophen (TYLENOL) tablet 1,000 mg (1,000 mg Oral Given 10/29/19 0923)  fentaNYL (SUBLIMAZE) injection 75 mcg (75 mcg Intravenous Given 10/29/19 0922)  ondansetron (ZOFRAN) injection 4 mg (4 mg Intravenous Given 10/29/19 0922)  iohexol (OMNIPAQUE) 350 MG/ML injection 100 mL (100 mLs Intravenous  Contrast Given 10/29/19 9735)     ED Discharge Orders    None       Note:  This document was prepared using Dragon voice recognition software and may include unintentional dictation errors.   Concha Se, MD 10/29/19 [phone]

## 2019-11-06 DIAGNOSIS — R002 Palpitations: Secondary | ICD-10-CM | POA: Insufficient documentation

## 2019-11-20 ENCOUNTER — Ambulatory Visit: Payer: Self-pay | Admitting: Cardiology

## 2019-11-21 ENCOUNTER — Encounter: Payer: Self-pay | Admitting: Cardiology

## 2019-12-06 ENCOUNTER — Emergency Department: Admission: EM | Admit: 2019-12-06 | Discharge: 2019-12-06 | Discharge status: Against Medical Advice | Payer: Self-pay

## 2019-12-31 ENCOUNTER — Emergency Department
Admission: EM | Admit: 2019-12-31 | Discharge: 2019-12-31 | Disposition: A | Discharge status: Home / Self Care | Payer: Self-pay | Attending: Emergency Medicine | Admitting: Emergency Medicine

## 2019-12-31 ENCOUNTER — Other Ambulatory Visit: Payer: Self-pay

## 2019-12-31 ENCOUNTER — Emergency Department: Payer: Self-pay

## 2019-12-31 DIAGNOSIS — F17210 Nicotine dependence, cigarettes, uncomplicated: Secondary | ICD-10-CM | POA: Insufficient documentation

## 2019-12-31 DIAGNOSIS — R059 Cough, unspecified: Secondary | ICD-10-CM

## 2019-12-31 DIAGNOSIS — R05 Cough: Secondary | ICD-10-CM | POA: Insufficient documentation

## 2019-12-31 DIAGNOSIS — R072 Precordial pain: Secondary | ICD-10-CM | POA: Insufficient documentation

## 2019-12-31 DIAGNOSIS — R0789 Other chest pain: Secondary | ICD-10-CM

## 2019-12-31 LAB — CBC
HCT: 43.4 % (ref 39.0–52.0)
Hemoglobin: 14.8 g/dL (ref 13.0–17.0)
MCH: 29 pg (ref 26.0–34.0)
MCHC: 34.1 g/dL (ref 30.0–36.0)
MCV: 85.1 fL (ref 80.0–100.0)
Platelets: 200 10*3/uL (ref 150–400)
RBC: 5.1 MIL/uL (ref 4.22–5.81)
RDW: 12.7 % (ref 11.5–15.5)
WBC: 8.7 10*3/uL (ref 4.0–10.5)
nRBC: 0 % (ref 0.0–0.2)

## 2019-12-31 LAB — BASIC METABOLIC PANEL
Anion gap: 9 (ref 5–15)
BUN: 14 mg/dL (ref 6–20)
CO2: 26 mmol/L (ref 22–32)
Calcium: 8.7 mg/dL — ABNORMAL LOW (ref 8.9–10.3)
Chloride: 100 mmol/L (ref 98–111)
Creatinine, Ser: 0.99 mg/dL (ref 0.61–1.24)
GFR calc Af Amer: >60 mL/min (ref >60)
GFR calc non Af Amer: >60 mL/min (ref >60)
Glucose, Bld: 97 mg/dL (ref 70–99)
Potassium: 4 mmol/L (ref 3.5–5.1)
Sodium: 135 mmol/L (ref 135–145)

## 2019-12-31 LAB — FIBRIN DERIVATIVES D-DIMER (ARMC ONLY): Fibrin derivatives D-dimer (ARMC): 122.68 ng/mL (FEU) (ref 0.00–499.00)

## 2019-12-31 LAB — TROPONIN I (HIGH SENSITIVITY): Troponin I (High Sensitivity): 3 ng/L (ref <18)

## 2019-12-31 MED ORDER — NAPROXEN 500 MG PO TABS
500.0000 mg | ORAL_TABLET | Freq: Once | ORAL | Status: AC
  Administered 2019-12-31: 500 mg via ORAL
  Filled 2019-12-31: qty 1

## 2019-12-31 MED ORDER — NAPROXEN 500 MG PO TABS: 500.0000 mg | ORAL_TABLET | Freq: Two times a day (BID) | ORAL | 0 refills | Status: DC

## 2019-12-31 NOTE — Discharge Instructions (Addendum)
You were seen today for chest pain and cough.  Your chest x-ray and labs were reassuring.  You likely have a condition called costochondritis which is an inflammation of the cartilage in between the ribs.  I have given you prescription for anti-inflammatories to take twice daily with food.  Please follow-up with your PCP if symptoms persist or worsen.

## 2019-12-31 NOTE — ED Triage Notes (Signed)
Patient to ED for a cough x 1 week. Took OTC medicine and is now coughing up yellow phlegm. States he does not have covid because when you have covid you just dry cough and not cough anything up. He states everybody told him that even the doctors. Points to the bottom of his lungs and says they are swelling up causing him pain from the coughing.

## 2019-12-31 NOTE — ED Notes (Signed)
See triage note Presents with prod cough for the past 5 days   No fever

## 2019-12-31 NOTE — ED Provider Notes (Signed)
[facility] Emergency Department Provider Note ____________________________________________  Time seen: 1210  I have reviewed the triage vital signs and the nursing notes.  HISTORY  Chief Complaint  Cough   HPI Patrick Huffman is a 29 y.o. male presents to the ER today with complaint of headache, runny nose, cough and substernal chest pain.  He reports this started 5 days ago.  The headache was located in his forehead.  He describes the pain as pressure.  He is blowing clear mucus out of his nose.  The cough is productive of yellow mucus.  He reports the chest pain occurs with applying pressure over the sternum.  It was worse yesterday, lasted for approximately 20 minutes and resolved without intervention.  He denies dizziness, visual changes, eye pain, eye redness or discharge, ear pain, nasal congestion, loss of taste/smell, sore throat or shortness of breath.  He denies nausea, vomiting, diarrhea or blood in the stool.  He denies reflux.  He denies fever, chills or body aches.  He has taken cough medication OTC with minimal relief of symptoms.  He has not had sick contacts or exposure to Covid that he is aware of.  He has had his Moderna vaccination.  Of note, he reports he has been seeing his PCP for persistent chest pain. He had a stress test 1 month ago, which was normal per his report. He was advised by his PCP that his chest pain was likely anxiety related. He reports he does not feel overly anxious at this time but is concerned about this cough and chest pain.   Past Medical History:  Diagnosis Date  . Back pain   . Heart murmur   . Hernia, inguinal     There are no problems to display for this patient.   History reviewed. No pertinent surgical history.  Prior to Admission medications   Medication Sig Start Date End Date Taking? Authorizing Provider  albuterol (VENTOLIN HFA) 108 (90 Base) MCG/ACT inhaler Inhale 2 puffs into the lungs every 6 (six)  hours as needed for wheezing or shortness of breath. 06/12/19   Concha Se, MD  famotidine (PEPCID) 20 MG tablet Take 1 tablet (20 mg total) by mouth 2 (two) times daily for 7 days. 10/05/19 10/12/19  Menshew, Charlesetta Ivory, PA-C  naproxen (NAPROSYN) 500 MG tablet Take 1 tablet (500 mg total) by mouth 2 (two) times daily with a meal. 12/31/19 12/30/20  Lorre Munroe, NP  cetirizine (ZYRTEC) 10 MG tablet Take 1 tablet (10 mg total) by mouth daily. Patient not taking: Reported on 11/26/2018 01/27/16 10/05/19  Cuthriell, Delorise Royals, PA-C  dicyclomine (BENTYL) 20 MG tablet Take 1 tablet (20 mg total) by mouth 3 (three) times daily as needed for spasms. 04/12/19 10/05/19  Minna Antis, MD  loratadine (CLARITIN) 10 MG tablet Take 1 tablet (10 mg total) by mouth daily for 14 days. 09/13/18 10/05/19  Shaune Pollack, MD  promethazine (PHENERGAN) 25 MG tablet Take 1 tablet (25 mg total) by mouth every 6 (six) hours as needed for nausea or vomiting. Patient not taking: Reported on 11/26/2018 05/07/16 10/05/19  Evangeline Dakin, PA-C    Allergies Tramadol, Vicodin [hydrocodone-acetaminophen], and Prednisone  No family history on file.  Social History Social History   Tobacco Use  . Smoking status: Current Every Day Smoker    Packs/day: 0.50    Types: Cigarettes  . Smokeless tobacco: Never Used  Vaping Use  . Vaping Use: Never used  Substance  Use Topics  . Alcohol use: Yes  . Drug use: No    Review of Systems  Constitutional: Negative for fever, chills or body aches. Eyes: Negative for visual changes, eye pain, eye redness or discharge. ENT: Positive for runny nose.  Negative for ear pain, nasal congestion, loss of taste/smell, or sore throat. Cardiovascular: Positive for substernal chest pain.  Negative for chest tightness. Respiratory: Positive for cough.  Negative for shortness of breath. Gastrointestinal: Negative for abdominal pain, nausea, vomiting, diarrhea or blood in  stool.. Musculoskeletal: Positive for sternal pain.  Negative for back pain. Skin: Negative for rash. Neurological: Positive for headache.  Negative for focal weakness. ____________________________________________  PHYSICAL EXAM:  VITAL SIGNS: ED Triage Vitals  Enc Vitals Group     BP 12/31/19 1129 126/73     Pulse Rate 12/31/19 1129 80     Resp 12/31/19 1129 20     Temp 12/31/19 1129 98.4 F (36.9 C)     Temp Source 12/31/19 1129 Oral     SpO2 12/31/19 1129 98 %     Weight 12/31/19 1130 200 lb (90.7 kg)     Height 12/31/19 1130 6' (1.829 m)     Head Circumference --      Peak Flow --      Pain Score 12/31/19 1129 0     Pain Loc --      Pain Edu? --      Excl. in GC? --     Constitutional: Alert and oriented. Well appearing and in no distress. Head: Normocephalic and atraumatic. Eyes: Conjunctivae are normal. PERRL. Normal extraocular movements Hematological/Lymphatic/Immunological: No cervical lymphadenopathy. Cardiovascular: Normal rate, regular rhythm. Normal distal pulses. Respiratory: Normal respiratory effort. No wheezes/rales/rhonchi. Gastrointestinal: Soft and nontender. No distention. Musculoskeletal: Pain with palpation over the sternum.  No pain with palpation over the anterior ribs. Neurologic:  Normal gait without ataxia. Normal speech and language. No gross focal neurologic deficits are appreciated. Skin:  Skin is warm, dry and intact. No rash noted.  ____________________________________________  RADIOLOGY  Imaging Orders     DG Chest 2 View IMPRESSION:  No active cardiopulmonary disease.   ____________________________________________  LABS  Labs Reviewed  BASIC METABOLIC PANEL - Abnormal; Notable for the following components:      Result Value   Calcium 8.7 (*)    All other components within normal limits  CBC  FIBRIN DERIVATIVES D-DIMER ([facility] ONLY)  TROPONIN I (HIGH SENSITIVITY)    ____________________________________________  ECG  ED  ECG REPORT   Date: 12/31/2019  EKG Time: 1:55 PM  Rate: 73  Rhythm: NSR  ST&T Change: None  Narrative Interpretation: normal ECG        INITIAL IMPRESSION / ASSESSMENT AND PLAN / ED COURSE  Cough, Midsternal Chest Pain:  Chest xray negative He reports his chest pain has intensified while he was here- will check CBC, BMET, D dimer and Troponin I all negative, low suspicion for cardiac involvement Naproxen 500 mg PO x 1 RX for Naproxen 500 mg BID x 5 days for possible costochondritis No indication for abx at this time Follow up with PCP if symptoms persist or worsen  ____________________________________________  FINAL CLINICAL IMPRESSION(S) / ED DIAGNOSES  Final diagnoses:  Midsternal chest pain  Cough      Lorre Munroe, NP 12/31/19 1355    Merwyn Katos, MD 12/31/19 1517

## 2020-01-02 ENCOUNTER — Emergency Department: Admission: EM | Admit: 2020-01-02 | Discharge: 2020-01-02 | Discharge status: Against Medical Advice | Payer: Self-pay

## 2020-01-02 ENCOUNTER — Other Ambulatory Visit: Payer: Self-pay

## 2020-01-12 ENCOUNTER — Emergency Department: Payer: Self-pay

## 2020-01-12 ENCOUNTER — Other Ambulatory Visit: Payer: Self-pay

## 2020-01-12 ENCOUNTER — Encounter: Payer: Self-pay | Admitting: Emergency Medicine

## 2020-01-12 ENCOUNTER — Emergency Department
Admission: EM | Admit: 2020-01-12 | Discharge: 2020-01-12 | Disposition: A | Discharge status: Home / Self Care | Payer: Self-pay | Attending: Student in an Organized Health Care Education/Training Program | Admitting: Student in an Organized Health Care Education/Training Program

## 2020-01-12 DIAGNOSIS — R0602 Shortness of breath: Secondary | ICD-10-CM | POA: Insufficient documentation

## 2020-01-12 DIAGNOSIS — Z5321 Procedure and treatment not carried out due to patient leaving prior to being seen by health care provider: Secondary | ICD-10-CM | POA: Insufficient documentation

## 2020-01-12 DIAGNOSIS — R079 Chest pain, unspecified: Secondary | ICD-10-CM | POA: Insufficient documentation

## 2020-01-12 LAB — TROPONIN I (HIGH SENSITIVITY): Troponin I (High Sensitivity): 2 ng/L (ref <18)

## 2020-01-12 LAB — BASIC METABOLIC PANEL
Anion gap: 8 (ref 5–15)
BUN: 15 mg/dL (ref 6–20)
CO2: 24 mmol/L (ref 22–32)
Calcium: 9 mg/dL (ref 8.9–10.3)
Chloride: 103 mmol/L (ref 98–111)
Creatinine, Ser: 1.11 mg/dL (ref 0.61–1.24)
GFR calc Af Amer: >60 mL/min (ref >60)
GFR calc non Af Amer: >60 mL/min (ref >60)
Glucose, Bld: 101 mg/dL — ABNORMAL HIGH (ref 70–99)
Potassium: 3.8 mmol/L (ref 3.5–5.1)
Sodium: 135 mmol/L (ref 135–145)

## 2020-01-12 LAB — CBC
HCT: 44.2 % (ref 39.0–52.0)
Hemoglobin: 15.6 g/dL (ref 13.0–17.0)
MCH: 29.3 pg (ref 26.0–34.0)
MCHC: 35.3 g/dL (ref 30.0–36.0)
MCV: 83.1 fL (ref 80.0–100.0)
Platelets: 247 10*3/uL (ref 150–400)
RBC: 5.32 MIL/uL (ref 4.22–5.81)
RDW: 13.2 % (ref 11.5–15.5)
WBC: 6.1 10*3/uL (ref 4.0–10.5)
nRBC: 0 % (ref 0.0–0.2)

## 2020-01-12 NOTE — ED Notes (Signed)
Pt called for lab redraw and vital recheck with no response.

## 2020-01-12 NOTE — ED Triage Notes (Signed)
Pt reports achy chest pain to the center of his chest that started about 20 minutes ago and radiates into his left arm. Pt reports some SOB as well.

## 2020-01-12 NOTE — ED Notes (Signed)
Pt called for lab redraw and repeat vital signs. No answer, pt not visualized in the lobby or outside

## 2020-01-12 NOTE — ED Notes (Signed)
Pt called x 3. Pt not visualized in lobby or outside

## 2020-01-21 ENCOUNTER — Emergency Department: Admission: EM | Admit: 2020-01-21 | Discharge: 2020-01-21 | Discharge status: Against Medical Advice | Payer: Self-pay

## 2020-02-16 ENCOUNTER — Emergency Department
Admission: EM | Admit: 2020-02-16 | Discharge: 2020-02-16 | Disposition: A | Discharge status: Home / Self Care | Payer: Self-pay | Attending: Emergency Medicine | Admitting: Emergency Medicine

## 2020-02-16 ENCOUNTER — Other Ambulatory Visit: Payer: Self-pay

## 2020-02-16 ENCOUNTER — Emergency Department: Payer: Self-pay

## 2020-02-16 DIAGNOSIS — R61 Generalized hyperhidrosis: Secondary | ICD-10-CM | POA: Insufficient documentation

## 2020-02-16 DIAGNOSIS — Z5321 Procedure and treatment not carried out due to patient leaving prior to being seen by health care provider: Secondary | ICD-10-CM | POA: Insufficient documentation

## 2020-02-16 DIAGNOSIS — R079 Chest pain, unspecified: Secondary | ICD-10-CM | POA: Insufficient documentation

## 2020-02-16 DIAGNOSIS — R0602 Shortness of breath: Secondary | ICD-10-CM | POA: Insufficient documentation

## 2020-02-16 LAB — BASIC METABOLIC PANEL
Anion gap: 11 (ref 5–15)
BUN: 15 mg/dL (ref 6–20)
CO2: 26 mmol/L (ref 22–32)
Calcium: 9.2 mg/dL (ref 8.9–10.3)
Chloride: 102 mmol/L (ref 98–111)
Creatinine, Ser: 1.11 mg/dL (ref 0.61–1.24)
GFR, Estimated: >60 mL/min (ref >60)
Glucose, Bld: 100 mg/dL — ABNORMAL HIGH (ref 70–99)
Potassium: 4.3 mmol/L (ref 3.5–5.1)
Sodium: 139 mmol/L (ref 135–145)

## 2020-02-16 LAB — TROPONIN I (HIGH SENSITIVITY): Troponin I (High Sensitivity): 3 ng/L (ref <18)

## 2020-02-16 LAB — CBC
HCT: 48.3 % (ref 39.0–52.0)
Hemoglobin: 16.4 g/dL (ref 13.0–17.0)
MCH: 29.1 pg (ref 26.0–34.0)
MCHC: 34 g/dL (ref 30.0–36.0)
MCV: 85.6 fL (ref 80.0–100.0)
Platelets: 255 10*3/uL (ref 150–400)
RBC: 5.64 MIL/uL (ref 4.22–5.81)
RDW: 12.7 % (ref 11.5–15.5)
WBC: 8.5 10*3/uL (ref 4.0–10.5)
nRBC: 0 % (ref 0.0–0.2)

## 2020-02-16 NOTE — ED Triage Notes (Signed)
Pt to ED with another family member visiting, started having sudden centralized CP, SHOB and diaphoretic.  States "felt like someone was standing on my chest but I feel better now"\ Denies N/V or radiation of pain Pt in NAD, RR Even and unlabored

## 2020-03-05 ENCOUNTER — Other Ambulatory Visit: Payer: Self-pay

## 2020-03-05 ENCOUNTER — Emergency Department: Payer: Self-pay

## 2020-03-05 ENCOUNTER — Emergency Department
Admission: EM | Admit: 2020-03-05 | Discharge: 2020-03-05 | Disposition: A | Discharge status: Home / Self Care | Payer: Self-pay | Attending: Emergency Medicine | Admitting: Emergency Medicine

## 2020-03-05 DIAGNOSIS — F17210 Nicotine dependence, cigarettes, uncomplicated: Secondary | ICD-10-CM | POA: Insufficient documentation

## 2020-03-05 DIAGNOSIS — Z87442 Personal history of urinary calculi: Secondary | ICD-10-CM | POA: Insufficient documentation

## 2020-03-05 DIAGNOSIS — R300 Dysuria: Secondary | ICD-10-CM | POA: Insufficient documentation

## 2020-03-05 DIAGNOSIS — R5383 Other fatigue: Secondary | ICD-10-CM | POA: Insufficient documentation

## 2020-03-05 DIAGNOSIS — R109 Unspecified abdominal pain: Secondary | ICD-10-CM | POA: Insufficient documentation

## 2020-03-05 DIAGNOSIS — M549 Dorsalgia, unspecified: Secondary | ICD-10-CM | POA: Insufficient documentation

## 2020-03-05 LAB — CBC
HCT: 45.6 % (ref 39.0–52.0)
Hemoglobin: 15 g/dL (ref 13.0–17.0)
MCH: 29 pg (ref 26.0–34.0)
MCHC: 32.9 g/dL (ref 30.0–36.0)
MCV: 88.2 fL (ref 80.0–100.0)
Platelets: 218 10*3/uL (ref 150–400)
RBC: 5.17 MIL/uL (ref 4.22–5.81)
RDW: 12.4 % (ref 11.5–15.5)
WBC: 9.6 10*3/uL (ref 4.0–10.5)
nRBC: 0 % (ref 0.0–0.2)

## 2020-03-05 LAB — COMPREHENSIVE METABOLIC PANEL
ALT: 50 U/L — ABNORMAL HIGH (ref 0–44)
AST: 29 U/L (ref 15–41)
Albumin: 4.4 g/dL (ref 3.5–5.0)
Alkaline Phosphatase: 62 U/L (ref 38–126)
Anion gap: 10 (ref 5–15)
BUN: 12 mg/dL (ref 6–20)
CO2: 26 mmol/L (ref 22–32)
Calcium: 9 mg/dL (ref 8.9–10.3)
Chloride: 102 mmol/L (ref 98–111)
Creatinine, Ser: 1.02 mg/dL (ref 0.61–1.24)
GFR, Estimated: >60 mL/min (ref >60)
Glucose, Bld: 108 mg/dL — ABNORMAL HIGH (ref 70–99)
Potassium: 3.8 mmol/L (ref 3.5–5.1)
Sodium: 138 mmol/L (ref 135–145)
Total Bilirubin: 0.6 mg/dL (ref 0.3–1.2)
Total Protein: 7.8 g/dL (ref 6.5–8.1)

## 2020-03-05 LAB — URINALYSIS, COMPLETE (UACMP) WITH MICROSCOPIC
Bacteria, UA: NONE SEEN
Bilirubin Urine: NEGATIVE
Glucose, UA: NEGATIVE mg/dL
Hgb urine dipstick: NEGATIVE
Ketones, ur: NEGATIVE mg/dL
Leukocytes,Ua: NEGATIVE
Nitrite: NEGATIVE
Protein, ur: NEGATIVE mg/dL
Specific Gravity, Urine: 1.02 (ref 1.005–1.030)
Squamous Epithelial / HPF: NONE SEEN (ref 0–5)
WBC, UA: NONE SEEN WBC/hpf (ref 0–5)
pH: 5 (ref 5.0–8.0)

## 2020-03-05 MED ORDER — SODIUM CHLORIDE 0.9 % IV BOLUS
1000.0000 mL | Freq: Once | INTRAVENOUS | Status: AC
  Administered 2020-03-05: 1000 mL via INTRAVENOUS

## 2020-03-05 MED ORDER — CYCLOBENZAPRINE HCL 5 MG PO TABS: ORAL_TABLET | ORAL | 0 refills | Status: DC

## 2020-03-05 MED ORDER — NAPROXEN 500 MG PO TABS: 500.0000 mg | ORAL_TABLET | Freq: Two times a day (BID) | ORAL | 0 refills | Status: DC

## 2020-03-05 MED ORDER — KETOROLAC TROMETHAMINE 30 MG/ML IJ SOLN
10.0000 mg | Freq: Once | INTRAMUSCULAR | Status: AC
  Administered 2020-03-05: 9.9 mg via INTRAVENOUS
  Filled 2020-03-05: qty 1

## 2020-03-05 NOTE — ED Provider Notes (Signed)
[facility] Emergency Department Provider Note   ____________________________________________   First MD Initiated Contact with Patient 03/05/20 [phone]     (approximate)  I have reviewed the triage vital signs and the nursing notes.   HISTORY  Chief Complaint Back Pain    HPI Patrick Huffman is a 29 y.o. male who presents to the ED from home with a chief complaint of nontraumatic right back pain.  Patient reports a 2-day history of right flank pain and difficulty urinating.  History of kidney stones but states this feels differently.  Also reporting fatigue.  Reports negative Covid test last week.  Denies fever, chills, cough, chest pain, abdominal pain, nausea or vomiting.     Past Medical History:  Diagnosis Date  . Back pain   . Heart murmur   . Hernia, inguinal     There are no problems to display for this patient.   No past surgical history on file.  Prior to Admission medications   Medication Sig Start Date End Date Taking? Authorizing Provider  albuterol (VENTOLIN HFA) 108 (90 Base) MCG/ACT inhaler Inhale 2 puffs into the lungs every 6 (six) hours as needed for wheezing or shortness of breath. 06/12/19   Concha Se, MD  famotidine (PEPCID) 20 MG tablet Take 1 tablet (20 mg total) by mouth 2 (two) times daily for 7 days. 10/05/19 10/12/19  Menshew, Charlesetta Ivory, PA-C  naproxen (NAPROSYN) 500 MG tablet Take 1 tablet (500 mg total) by mouth 2 (two) times daily with a meal. 12/31/19 12/30/20  Lorre Munroe, NP  cetirizine (ZYRTEC) 10 MG tablet Take 1 tablet (10 mg total) by mouth daily. Patient not taking: Reported on 11/26/2018 01/27/16 10/05/19  Cuthriell, Delorise Royals, PA-C  dicyclomine (BENTYL) 20 MG tablet Take 1 tablet (20 mg total) by mouth 3 (three) times daily as needed for spasms. 04/12/19 10/05/19  Minna Antis, MD  loratadine (CLARITIN) 10 MG tablet Take 1 tablet (10 mg total) by mouth daily for 14 days. 09/13/18 10/05/19  Shaune Pollack, MD  promethazine (PHENERGAN) 25 MG tablet Take 1 tablet (25 mg total) by mouth every 6 (six) hours as needed for nausea or vomiting. Patient not taking: Reported on 11/26/2018 05/07/16 10/05/19  Evangeline Dakin, PA-C    Allergies Tramadol, Vicodin [hydrocodone-acetaminophen], and Prednisone  No family history on file.  Social History Social History   Tobacco Use  . Smoking status: Current Every Day Smoker    Packs/day: 0.50    Types: Cigarettes  . Smokeless tobacco: Never Used  Vaping Use  . Vaping Use: Never used  Substance Use Topics  . Alcohol use: Yes  . Drug use: No    Review of Systems  Constitutional: No fever/chills Eyes: No visual changes. ENT: No sore throat. Cardiovascular: Denies chest pain. Respiratory: Denies shortness of breath. Gastrointestinal: Positive for right flank pain.  No abdominal pain.  No nausea, no vomiting.  No diarrhea.  No constipation. Genitourinary: Negative for dysuria. Musculoskeletal: Negative for back pain. Skin: Negative for rash. Neurological: Negative for headaches, focal weakness or numbness.   ____________________________________________   PHYSICAL EXAM:  VITAL SIGNS: ED Triage Vitals  Enc Vitals Group     BP 03/05/20 0307 (!) 132/99     Pulse Rate 03/05/20 0307 69     Resp 03/05/20 0307 20     Temp 03/05/20 0307 98.8 F (37.1 C)     Temp Source 03/05/20 0307 Oral     SpO2 03/05/20  0307 99 %     Weight 03/05/20 0308 190 lb (86.2 kg)     Height 03/05/20 0308 6' (1.829 m)     Head Circumference --      Peak Flow --      Pain Score 03/05/20 0308 7     Pain Loc --      Pain Edu? --      Excl. in GC? --     Constitutional: Asleep, awakened for exam.  Alert and oriented. Well appearing and in no acute distress. Eyes: Conjunctivae are normal. PERRL. EOMI. Head: Atraumatic. Nose: No congestion/rhinnorhea. Mouth/Throat: Mucous membranes are moist.   Neck: No stridor.   Cardiovascular: Normal rate, regular  rhythm. Grossly normal heart sounds.  Good peripheral circulation. Respiratory: Normal respiratory effort.  No retractions. Lungs CTAB. Gastrointestinal: Soft and nontender to light or deep palpation. No distention. No abdominal bruits.  Mild right CVA tenderness. Musculoskeletal: No lower extremity tenderness nor edema.  No joint effusions. Neurologic:  Normal speech and language. No gross focal neurologic deficits are appreciated. No gait instability. Skin:  Skin is warm, dry and intact. No rash noted.  No vesicles.  Acne to bilateral back. Psychiatric: Mood and affect are normal. Speech and behavior are normal.  ____________________________________________   LABS (all labs ordered are listed, but only abnormal results are displayed)  Labs Reviewed  COMPREHENSIVE METABOLIC PANEL - Abnormal; Notable for the following components:      Result Value   Glucose, Bld 108 (*)    ALT 50 (*)    All other components within normal limits  URINALYSIS, COMPLETE (UACMP) WITH MICROSCOPIC - Abnormal; Notable for the following components:   Color, Urine YELLOW (*)    APPearance CLEAR (*)    All other components within normal limits  CBC   ____________________________________________  EKG  None ____________________________________________  RADIOLOGY I, Amad Mau J, personally viewed and evaluated these images (plain radiographs) as part of my medical decision making, as well as reviewing the written report by the radiologist.  ED MD interpretation: Unremarkable CT  Official radiology report(s): CT Renal Stone Study  Result Date: 03/05/2020 CLINICAL DATA:  Lower right back and right flank pain beginning 2 days ago. Fatigue and difficulty sleeping due to pain. EXAM: CT ABDOMEN AND PELVIS WITHOUT CONTRAST TECHNIQUE: Multidetector CT imaging of the abdomen and pelvis was performed following the standard protocol without IV contrast. COMPARISON:  05/17/2015 FINDINGS: Lower chest: The lung bases  are clear. Hepatobiliary: No focal liver abnormality is seen. No gallstones, gallbladder wall thickening, or biliary dilatation. Pancreas: Unremarkable. No pancreatic ductal dilatation or surrounding inflammatory changes. Spleen: Normal in size without focal abnormality. Adrenals/Urinary Tract: Adrenal glands are unremarkable. Kidneys are normal, without renal calculi, focal lesion, or hydronephrosis. Bladder is unremarkable. Stomach/Bowel: Stomach is within normal limits. Appendix appears normal. No evidence of bowel wall thickening, distention, or inflammatory changes. Vascular/Lymphatic: No significant vascular findings are present. No enlarged abdominal or pelvic lymph nodes. Reproductive: Prostate is unremarkable. Other: No abdominal wall hernia or abnormality. No abdominopelvic ascites. Musculoskeletal: No acute or significant osseous findings. IMPRESSION: No acute process demonstrated in the abdomen or pelvis. No renal or ureteral stone or obstruction. Electronically Signed   By: Burman Nieves M.D.   On: 03/05/2020 04:55    ____________________________________________   PROCEDURES  Procedure(s) performed (including Critical Care):  Procedures   ____________________________________________   INITIAL IMPRESSION / ASSESSMENT AND PLAN / ED COURSE  As part of my medical decision making, I reviewed the following  data within the electronic MEDICAL RECORD NUMBER Nursing notes reviewed and incorporated, Labs reviewed, Old chart reviewed, Radiograph reviewed, Notes from prior ED visits (multiple ED visits for minor care issues) and [place] Controlled Substance Database     29 year old male presenting with right flank pain. Differential diagnosis includes, but is not limited to, acute appendicitis, renal colic, testicular torsion, urinary tract infection/pyelonephritis, prostatitis,  epididymitis, diverticulitis, small bowel obstruction or ileus, colitis, abdominal aortic aneurysm, gastroenteritis, hernia,  etc.  Laboratory and urinalysis results unremarkable.  Given history of kidney stones, will obtain CT renal colic study.  Administer IV fluids, IV Toradol for pain and reassess.  Clinical Course as of Mar 05 548  Tue Mar 05, 2020  [id number] Patient resting in no acute distress.  Updated him of unremarkable CT results.  Will discharge home with as needed prescriptions for NSAIDs, analgesia and muscle relaxer.  Strict return precautions given.  Patient verbalizes understanding agrees with plan of care.   [JS]    Clinical Course User Index [JS] Irean Hong, MD     ____________________________________________   FINAL CLINICAL IMPRESSION(S) / ED DIAGNOSES  Final diagnoses:  Right flank pain     ED Discharge Orders    None      *Please note:  AZTLAN COLL was evaluated in Emergency Department on 03/05/2020 for the symptoms described in the history of present illness. He was evaluated in the context of the global COVID-19 pandemic, which necessitated consideration that the patient might be at risk for infection with the SARS-CoV-2 virus that causes COVID-19. Institutional protocols and algorithms that pertain to the evaluation of patients at risk for COVID-19 are in a state of rapid change based on information released by regulatory bodies including the CDC and federal and state organizations. These policies and algorithms were followed during the patient's care in the ED.  Some ED evaluations and interventions may be delayed as a result of limited staffing during and the pandemic.*   Note:  This document was prepared using Dragon voice recognition software and may include unintentional dictation errors.   Irean Hong, MD 03/05/20 [phone]

## 2020-03-05 NOTE — ED Notes (Signed)
Pt reports pain in lower right side of back into right flank area. Pain began 2 days ago. Pt reports feeling fatigued and unable to sleep for more than 3-4 hours at a time due to the pain waking him up. Pt reports feeling the need to urinate more often but denies a burning sensation while urinating. Pain upon palpation to right lower back. Pt reports feeling more comfortable in a laying down position as opposed to sitting up or moving.

## 2020-03-05 NOTE — Discharge Instructions (Signed)
You may take medicines as needed for pain and muscle relaxation (Naprosyn/Flexeril).  Return to the ER for worsening symptoms, persistent vomiting, difficulty breathing or other concerns.

## 2020-03-05 NOTE — ED Triage Notes (Signed)
Pt in with co back pain that started tonight. States started in lower and radiates to right flank. States has had increased weakness and trouble sleeping recently. Pt states has hx of kidney stones but states feels different.

## 2020-03-17 ENCOUNTER — Emergency Department
Admission: EM | Admit: 2020-03-17 | Discharge: 2020-03-17 | Disposition: A | Discharge status: Other Acute Inpatient Hospital | Payer: Medicaid Other

## 2020-03-25 ENCOUNTER — Other Ambulatory Visit: Payer: Self-pay

## 2020-03-25 ENCOUNTER — Encounter: Payer: Self-pay | Admitting: Radiology

## 2020-03-25 ENCOUNTER — Emergency Department: Payer: Medicaid Other

## 2020-03-25 DIAGNOSIS — F17210 Nicotine dependence, cigarettes, uncomplicated: Secondary | ICD-10-CM | POA: Insufficient documentation

## 2020-03-25 DIAGNOSIS — R5383 Other fatigue: Secondary | ICD-10-CM | POA: Insufficient documentation

## 2020-03-25 DIAGNOSIS — R519 Headache, unspecified: Secondary | ICD-10-CM | POA: Insufficient documentation

## 2020-03-25 DIAGNOSIS — R5381 Other malaise: Secondary | ICD-10-CM | POA: Insufficient documentation

## 2020-03-25 DIAGNOSIS — M940 Chondrocostal junction syndrome [Tietze]: Secondary | ICD-10-CM | POA: Insufficient documentation

## 2020-03-25 LAB — COMPREHENSIVE METABOLIC PANEL
ALT: 66 U/L — ABNORMAL HIGH (ref 0–44)
AST: 37 U/L (ref 15–41)
Albumin: 4.7 g/dL (ref 3.5–5.0)
Alkaline Phosphatase: 65 U/L (ref 38–126)
Anion gap: 10 (ref 5–15)
BUN: 15 mg/dL (ref 6–20)
CO2: 28 mmol/L (ref 22–32)
Calcium: 9.5 mg/dL (ref 8.9–10.3)
Chloride: 99 mmol/L (ref 98–111)
Creatinine, Ser: 1.05 mg/dL (ref 0.61–1.24)
GFR, Estimated: >60 mL/min (ref >60)
Glucose, Bld: 100 mg/dL — ABNORMAL HIGH (ref 70–99)
Potassium: 3.9 mmol/L (ref 3.5–5.1)
Sodium: 137 mmol/L (ref 135–145)
Total Bilirubin: 0.7 mg/dL (ref 0.3–1.2)
Total Protein: 8.3 g/dL — ABNORMAL HIGH (ref 6.5–8.1)

## 2020-03-25 LAB — CBC
HCT: 47.1 % (ref 39.0–52.0)
Hemoglobin: 15.9 g/dL (ref 13.0–17.0)
MCH: 29.2 pg (ref 26.0–34.0)
MCHC: 33.8 g/dL (ref 30.0–36.0)
MCV: 86.4 fL (ref 80.0–100.0)
Platelets: 221 10*3/uL (ref 150–400)
RBC: 5.45 MIL/uL (ref 4.22–5.81)
RDW: 12.2 % (ref 11.5–15.5)
WBC: 8.2 10*3/uL (ref 4.0–10.5)
nRBC: 0 % (ref 0.0–0.2)

## 2020-03-25 LAB — TROPONIN I (HIGH SENSITIVITY): Troponin I (High Sensitivity): 3 ng/L (ref <18)

## 2020-03-25 NOTE — ED Triage Notes (Signed)
Pt in with co chest pain that started 30 min prior to arrival no hx of heart disease.

## 2020-03-26 ENCOUNTER — Emergency Department
Admission: EM | Admit: 2020-03-26 | Discharge: 2020-03-26 | Disposition: A | Discharge status: Home / Self Care | Payer: Medicaid Other | Attending: Emergency Medicine | Admitting: Emergency Medicine

## 2020-03-26 DIAGNOSIS — M940 Chondrocostal junction syndrome [Tietze]: Secondary | ICD-10-CM

## 2020-03-26 LAB — TROPONIN I (HIGH SENSITIVITY): Troponin I (High Sensitivity): 3 ng/L (ref <18)

## 2020-03-26 MED ORDER — NAPROXEN 500 MG PO TABS: 500.0000 mg | ORAL_TABLET | Freq: Two times a day (BID) | ORAL | 0 refills | Status: DC

## 2020-03-26 MED ORDER — NAPROXEN 500 MG PO TABS
500.0000 mg | ORAL_TABLET | Freq: Once | ORAL | Status: DC
  Filled 2020-03-26: qty 1

## 2020-03-26 NOTE — ED Notes (Signed)
This RN to bedside with medication and COVID swab to find that patient not there.  Attempted to call patient cell phone without answer.  Patient did not inform staff of his leaving.  MD notified.

## 2020-03-26 NOTE — ED Provider Notes (Addendum)
[facility] Emergency Department Provider Note   ____________________________________________   Event Date/Time   First MD Initiated Contact with Patient 03/26/20 [phone]     (approximate)  I have reviewed the triage vital signs and the nursing notes.   HISTORY  Chief Complaint Chest Pain    HPI Patrick Huffman is a 29 y.o. male with no significant past medical history who presents to the ED complaining of chest pain.  Patient reports that he has been having approximately 1 week of sharp pain in the center of his chest.  He states it is worse when he takes a deep breath or when he puts pressure on his chest.  He has been feeling malaised with headache, cough, and fatigue.  He denies any fevers or shortness of breath.  He also denies any nausea, vomiting, diarrhea, or sick contacts.  He has been evaluated at the [facility] ED for similar symptoms a few days ago, states COVID-19 testing was negative at that time and they prescribed him steroids as well as an inhaler.  His symptoms have worsened since then and he is requesting reevaluation for COVID-19.        Past Medical History:  Diagnosis Date  . Back pain   . Heart murmur   . Hernia, inguinal     There are no problems to display for this patient.   No past surgical history on file.  Prior to Admission medications   Medication Sig Start Date End Date Taking? Authorizing Provider  albuterol (VENTOLIN HFA) 108 (90 Base) MCG/ACT inhaler Inhale 2 puffs into the lungs every 6 (six) hours as needed for wheezing or shortness of breath. 06/12/19   Concha Se, MD  cyclobenzaprine (FLEXERIL) 5 MG tablet 1 tablet every 8 hours as needed for muscle spasms 03/05/20   Irean Hong, MD  cyclobenzaprine (FLEXERIL) 5 MG tablet 1 tablet every 8 hours as needed for muscle spasms 03/05/20   Irean Hong, MD  famotidine (PEPCID) 20 MG tablet Take 1 tablet (20 mg total) by mouth 2 (two) times daily for 7 days. 10/05/19  10/12/19  Menshew, Charlesetta Ivory, PA-C  naproxen (NAPROSYN) 500 MG tablet Take 1 tablet (500 mg total) by mouth 2 (two) times daily with a meal. 03/05/20   Irean Hong, MD  naproxen (NAPROSYN) 500 MG tablet Take 1 tablet (500 mg total) by mouth 2 (two) times daily with a meal. 03/05/20   Irean Hong, MD  cetirizine (ZYRTEC) 10 MG tablet Take 1 tablet (10 mg total) by mouth daily. Patient not taking: Reported on 11/26/2018 01/27/16 10/05/19  Cuthriell, Delorise Royals, PA-C  dicyclomine (BENTYL) 20 MG tablet Take 1 tablet (20 mg total) by mouth 3 (three) times daily as needed for spasms. 04/12/19 10/05/19  Minna Antis, MD  loratadine (CLARITIN) 10 MG tablet Take 1 tablet (10 mg total) by mouth daily for 14 days. 09/13/18 10/05/19  Shaune Pollack, MD  promethazine (PHENERGAN) 25 MG tablet Take 1 tablet (25 mg total) by mouth every 6 (six) hours as needed for nausea or vomiting. Patient not taking: Reported on 11/26/2018 05/07/16 10/05/19  Evangeline Dakin, PA-C    Allergies No known allergies  No family history on file.  Social History Social History   Tobacco Use  . Smoking status: Current Every Day Smoker    Packs/day: 0.50    Types: Cigarettes  . Smokeless tobacco: Never Used  Vaping Use  . Vaping Use: Never used  Substance Use Topics  . Alcohol use: Yes  . Drug use: No    Review of Systems  Constitutional: No fever/chills.  Positive for malaise and fatigue. Eyes: No visual changes. ENT: No sore throat. Cardiovascular: Positive for chest pain. Respiratory: Denies shortness of breath.  Positive for cough. Gastrointestinal: No abdominal pain.  No nausea, no vomiting.  No diarrhea.  No constipation. Genitourinary: Negative for dysuria. Musculoskeletal: Negative for back pain. Skin: Negative for rash. Neurological: Negative for headaches, focal weakness or numbness.  ____________________________________________   PHYSICAL EXAM:  VITAL SIGNS: ED Triage Vitals  Enc Vitals  Group     BP 03/25/20 1922 (!) 136/101     Pulse Rate 03/25/20 1922 95     Resp 03/25/20 1922 18     Temp 03/25/20 1922 98.2 F (36.8 C)     Temp Source 03/25/20 1922 Oral     SpO2 03/25/20 1922 100 %     Weight 03/25/20 1923 200 lb (90.7 kg)     Height 03/25/20 1923 6' (1.829 m)     Head Circumference --      Peak Flow --      Pain Score 03/25/20 1922 8     Pain Loc --      Pain Edu? --      Excl. in GC? --     Constitutional: Alert and oriented. Eyes: Conjunctivae are normal. Head: Atraumatic. Nose: No congestion/rhinnorhea. Mouth/Throat: Mucous membranes are moist. Neck: Normal ROM Cardiovascular: Normal rate, regular rhythm. Grossly normal heart sounds.  Central chest wall tenderness to palpation.  2+ radial pulses bilaterally. Respiratory: Normal respiratory effort.  No retractions. Lungs CTAB. Gastrointestinal: Soft and nontender. No distention. Genitourinary: deferred Musculoskeletal: No lower extremity tenderness nor edema. Neurologic:  Normal speech and language. No gross focal neurologic deficits are appreciated. Skin:  Skin is warm, dry and intact. No rash noted. Psychiatric: Mood and affect are normal. Speech and behavior are normal.  ____________________________________________   LABS (all labs ordered are listed, but only abnormal results are displayed)  Labs Reviewed  COMPREHENSIVE METABOLIC PANEL - Abnormal; Notable for the following components:      Result Value   Glucose, Bld 100 (*)    Total Protein 8.3 (*)    ALT 66 (*)    All other components within normal limits  RESP PANEL BY RT-PCR (FLU A&B, COVID) ARPGX2  CBC  TROPONIN I (HIGH SENSITIVITY)  TROPONIN I (HIGH SENSITIVITY)   ____________________________________________  EKG  ED ECG REPORT I, Chesley Noon, the attending physician, personally viewed and interpreted this ECG.   Date: 03/26/2020  EKG Time: 19:24  Rate: 82  Rhythm: normal sinus rhythm  Axis: Normal  Intervals:none   ST&T Change: None   PROCEDURES  Procedure(s) performed (including Critical Care):  Procedures   ____________________________________________   INITIAL IMPRESSION / ASSESSMENT AND PLAN / ED COURSE       29 year old male with no significant past medical history presents to the ED complaining of constant pain in his chest for the past week that is worse when he puts pressure on his chest or takes a deep breath.  Pain is reproducible with palpation over his sternum and costochondral joints.  EKG shows no evidence of arrhythmia or ischemia and 2 sets of troponin are negative.  Given his heart score of less than 4, I doubt ACS.  He is PERC negative and I doubt PE or dissection.  Chest x-ray reviewed by me and shows no infiltrate, edema, or effusion.  Presentation most consistent with costochondritis and we will start patient on regular anti-inflammatories, also perform repeat testing for COVID-19 at patient's request.  He was counseled to follow-up with PCP and return to the ED for new or worsening symptoms, patient agrees with plan.  Patient eloped from the department prior to receiving medication or repeat Covid testing.      ____________________________________________   FINAL CLINICAL IMPRESSION(S) / ED DIAGNOSES  Final diagnoses:  Costochondritis     ED Discharge Orders    None       Note:  This document was prepared using Dragon voice recognition software and may include unintentional dictation errors.   Chesley Noon, MD 03/26/20 1740    Chesley Noon, MD 03/26/20 [phone]

## 2020-03-31 ENCOUNTER — Other Ambulatory Visit: Payer: Self-pay

## 2020-03-31 DIAGNOSIS — M79621 Pain in right upper arm: Secondary | ICD-10-CM | POA: Insufficient documentation

## 2020-03-31 DIAGNOSIS — R531 Weakness: Secondary | ICD-10-CM | POA: Insufficient documentation

## 2020-03-31 DIAGNOSIS — Z79899 Other long term (current) drug therapy: Secondary | ICD-10-CM | POA: Insufficient documentation

## 2020-03-31 DIAGNOSIS — F17210 Nicotine dependence, cigarettes, uncomplicated: Secondary | ICD-10-CM | POA: Insufficient documentation

## 2020-03-31 DIAGNOSIS — G43809 Other migraine, not intractable, without status migrainosus: Secondary | ICD-10-CM | POA: Insufficient documentation

## 2020-04-01 ENCOUNTER — Encounter: Payer: Self-pay | Admitting: Emergency Medicine

## 2020-04-01 ENCOUNTER — Emergency Department: Payer: Self-pay

## 2020-04-01 ENCOUNTER — Other Ambulatory Visit: Payer: Self-pay

## 2020-04-01 ENCOUNTER — Emergency Department
Admission: EM | Admit: 2020-04-01 | Discharge: 2020-04-01 | Disposition: A | Discharge status: Home / Self Care | Payer: Self-pay | Attending: Emergency Medicine | Admitting: Emergency Medicine

## 2020-04-01 DIAGNOSIS — R531 Weakness: Secondary | ICD-10-CM

## 2020-04-01 DIAGNOSIS — G43809 Other migraine, not intractable, without status migrainosus: Secondary | ICD-10-CM

## 2020-04-01 LAB — CBC
HCT: 46.2 % (ref 39.0–52.0)
Hemoglobin: 15.7 g/dL (ref 13.0–17.0)
MCH: 29.1 pg (ref 26.0–34.0)
MCHC: 34 g/dL (ref 30.0–36.0)
MCV: 85.7 fL (ref 80.0–100.0)
Platelets: 215 10*3/uL (ref 150–400)
RBC: 5.39 MIL/uL (ref 4.22–5.81)
RDW: 12.1 % (ref 11.5–15.5)
WBC: 8.8 10*3/uL (ref 4.0–10.5)
nRBC: 0 % (ref 0.0–0.2)

## 2020-04-01 LAB — URINALYSIS, COMPLETE (UACMP) WITH MICROSCOPIC
Bacteria, UA: NONE SEEN
Bilirubin Urine: NEGATIVE
Glucose, UA: NEGATIVE mg/dL
Hgb urine dipstick: NEGATIVE
Ketones, ur: NEGATIVE mg/dL
Leukocytes,Ua: NEGATIVE
Nitrite: NEGATIVE
Protein, ur: NEGATIVE mg/dL
Specific Gravity, Urine: 1.028 (ref 1.005–1.030)
Squamous Epithelial / HPF: NONE SEEN (ref 0–5)
pH: 7 (ref 5.0–8.0)

## 2020-04-01 LAB — COMPREHENSIVE METABOLIC PANEL
ALT: 42 U/L (ref 0–44)
AST: 25 U/L (ref 15–41)
Albumin: 4.3 g/dL (ref 3.5–5.0)
Alkaline Phosphatase: 61 U/L (ref 38–126)
Anion gap: 8 (ref 5–15)
BUN: 14 mg/dL (ref 6–20)
CO2: 27 mmol/L (ref 22–32)
Calcium: 9.1 mg/dL (ref 8.9–10.3)
Chloride: 104 mmol/L (ref 98–111)
Creatinine, Ser: 1.01 mg/dL (ref 0.61–1.24)
GFR, Estimated: >60 mL/min (ref >60)
Glucose, Bld: 107 mg/dL — ABNORMAL HIGH (ref 70–99)
Potassium: 3.8 mmol/L (ref 3.5–5.1)
Sodium: 139 mmol/L (ref 135–145)
Total Bilirubin: 0.5 mg/dL (ref 0.3–1.2)
Total Protein: 7.7 g/dL (ref 6.5–8.1)

## 2020-04-01 LAB — TROPONIN I (HIGH SENSITIVITY)
Troponin I (High Sensitivity): 3 ng/L (ref <18)
Troponin I (High Sensitivity): 3 ng/L (ref <18)

## 2020-04-01 MED ORDER — PROCHLORPERAZINE MALEATE 10 MG PO TABS
10.0000 mg | ORAL_TABLET | Freq: Once | ORAL | Status: DC
  Filled 2020-04-01: qty 1

## 2020-04-01 MED ORDER — NAPROXEN 500 MG PO TABS: 500.0000 mg | ORAL_TABLET | Freq: Two times a day (BID) | ORAL | 0 refills | Status: DC

## 2020-04-01 MED ORDER — ACETAMINOPHEN 500 MG PO TABS: 1000.0000 mg | ORAL_TABLET | Freq: Once | ORAL | Status: AC

## 2020-04-01 MED ORDER — PROCHLORPERAZINE MALEATE 10 MG PO TABS: 10.0000 mg | ORAL_TABLET | Freq: Four times a day (QID) | ORAL | 0 refills | Status: DC | PRN

## 2020-04-01 MED ORDER — ACETAMINOPHEN 500 MG PO TABS
ORAL_TABLET | ORAL | Status: AC
  Administered 2020-04-01: 06:00:00 1000 mg via ORAL
  Filled 2020-04-01: qty 2

## 2020-04-01 NOTE — ED Notes (Signed)
Patient reports being seen in this ER on 12/18. CO bilateral arm pain secondary to IV insertion on the left antecubital and blood draw on the right antecubital

## 2020-04-01 NOTE — ED Provider Notes (Signed)
[facility] Emergency Department Provider Note   ____________________________________________   Event Date/Time   First MD Initiated Contact with Patient 04/01/20 [id number]     (approximate)  I have reviewed the triage vital signs and the nursing notes.   HISTORY  Chief Complaint Weakness and Headache    HPI Patrick Huffman is a 29 y.o. male who presents to the ED from home with a chief complaint of feeling weak, hot with a headache.  Patient reports symptoms onset approximately 30 minutes prior to arrival.  History of frequent headaches, never seen a neurologist.  Reports hot feeling over his left temple without associated vision changes, neck pain, dizziness, nausea or vomiting.  Denies fever, cough, chest pain, shortness of breath, abdominal pain, dysuria or diarrhea.  Also complaining of right upper arm pain and swelling from recent IV site as well as left forearm pain and swelling from recent IV site.     Past Medical History:  Diagnosis Date  . Back pain   . Heart murmur   . Hernia, inguinal     There are no problems to display for this patient.   History reviewed. No pertinent surgical history.  Prior to Admission medications   Medication Sig Start Date End Date Taking? Authorizing Provider  albuterol (VENTOLIN HFA) 108 (90 Base) MCG/ACT inhaler Inhale 2 puffs into the lungs every 6 (six) hours as needed for wheezing or shortness of breath. 06/12/19   Concha SeFunke, Mary E, MD  cyclobenzaprine (FLEXERIL) 5 MG tablet 1 tablet every 8 hours as needed for muscle spasms 03/05/20   Irean HongSung, Masoud Nyce J, MD  cyclobenzaprine (FLEXERIL) 5 MG tablet 1 tablet every 8 hours as needed for muscle spasms 03/05/20   Irean HongSung, Laqueena Hinchey J, MD  famotidine (PEPCID) 20 MG tablet Take 1 tablet (20 mg total) by mouth 2 (two) times daily for 7 days. 10/05/19 10/12/19  Menshew, Charlesetta IvoryJenise V Bacon, PA-C  naproxen (NAPROSYN) 500 MG tablet Take 1 tablet (500 mg total) by mouth 2 (two) times daily with a  meal. 04/01/20   Irean HongSung, Whitman Meinhardt J, MD  prochlorperazine (COMPAZINE) 10 MG tablet Take 1 tablet (10 mg total) by mouth every 6 (six) hours as needed for nausea (headache). 04/01/20   Irean HongSung, Waylyn Tenbrink J, MD  cetirizine (ZYRTEC) 10 MG tablet Take 1 tablet (10 mg total) by mouth daily. Patient not taking: Reported on 11/26/2018 01/27/16 10/05/19  Cuthriell, Delorise RoyalsJonathan D, PA-C  dicyclomine (BENTYL) 20 MG tablet Take 1 tablet (20 mg total) by mouth 3 (three) times daily as needed for spasms. 04/12/19 10/05/19  Minna AntisPaduchowski, Kevin, MD  loratadine (CLARITIN) 10 MG tablet Take 1 tablet (10 mg total) by mouth daily for 14 days. 09/13/18 10/05/19  Shaune PollackIsaacs, Cameron, MD  promethazine (PHENERGAN) 25 MG tablet Take 1 tablet (25 mg total) by mouth every 6 (six) hours as needed for nausea or vomiting. Patient not taking: Reported on 11/26/2018 05/07/16 10/05/19  Evangeline DakinBeers, Charles M, PA-C    Allergies No known allergies  No family history on file.  Social History Social History   Tobacco Use  . Smoking status: Current Every Day Smoker    Packs/day: 0.50    Types: Cigarettes  . Smokeless tobacco: Never Used  Vaping Use  . Vaping Use: Never used  Substance Use Topics  . Alcohol use: Yes  . Drug use: No    Review of Systems  Constitutional: Positive for generalized weakness and feeling hot.  No fever/chills Eyes: No visual changes. ENT: No sore  throat. Cardiovascular: Denies chest pain. Respiratory: Denies shortness of breath. Gastrointestinal: No abdominal pain.  No nausea, no vomiting.  No diarrhea.  No constipation. Genitourinary: Negative for dysuria. Musculoskeletal: Positive for BUE pain and swelling.  Negative for back pain. Skin: Negative for rash. Neurological: Positive for headache.negative for focal weakness or numbness.   ____________________________________________   PHYSICAL EXAM:  VITAL SIGNS: ED Triage Vitals [04/01/20 0003]  Enc Vitals Group     BP 137/90     Pulse Rate 71     Resp 18      Temp 98.6 F (37 C)     Temp Source Oral     SpO2 98 %     Weight 200 lb (90.7 kg)     Height 6' (1.829 m)     Head Circumference      Peak Flow      Pain Score 8     Pain Loc      Pain Edu?      Excl. in GC?     Constitutional: Alert and oriented. Well appearing and in no acute distress. Eyes: Conjunctivae are normal. PERRL. EOMI. Head: Atraumatic. Nose: No congestion/rhinnorhea. Mouth/Throat: Mucous membranes are moist.   Neck: No stridor.  Supple neck without meningismus.  No carotid bruits. Cardiovascular: Normal rate, regular rhythm. Grossly normal heart sounds.  Good peripheral circulation. Respiratory: Normal respiratory effort.  No retractions. Lungs CTAB. Gastrointestinal: Soft and nontender to light or deep palpation. No distention. No abdominal bruits. No CVA tenderness. Musculoskeletal:  RUE: Area overlying biceps mildly swollen.  No warmth/erythema/abscess to prior IV site. LUE: Forearm mildly swollen.  No warmth/erythema/abscess to prior IV site. No lower extremity tenderness nor edema.  No joint effusions. Neurologic: Alert and oriented x3.  CN II-XII grossly intact. Normal speech and language. No gross focal neurologic deficits are appreciated. No gait instability. Skin:  Skin is warm, dry and intact. No rash noted. Psychiatric: Mood and affect are normal. Speech and behavior are normal.  ____________________________________________   LABS (all labs ordered are listed, but only abnormal results are displayed)  Labs Reviewed  COMPREHENSIVE METABOLIC PANEL - Abnormal; Notable for the following components:      Result Value   Glucose, Bld 107 (*)    All other components within normal limits  URINALYSIS, COMPLETE (UACMP) WITH MICROSCOPIC - Abnormal; Notable for the following components:   Color, Urine YELLOW (*)    APPearance CLEAR (*)    All other components within normal limits  CBC  TROPONIN I (HIGH SENSITIVITY)  TROPONIN I (HIGH SENSITIVITY)    ____________________________________________  EKG  ED ECG REPORT I, Alyce Inscore J, the attending physician, personally viewed and interpreted this ECG.   Date: 04/01/2020  EKG Time: 0005  Rate: 85  Rhythm: normal EKG, normal sinus rhythm  Axis: Normal  Intervals:none  ST&T Change: Nonspecific  ____________________________________________  RADIOLOGY I, Sylis Ketchum J, personally viewed and evaluated these images (plain radiographs) as part of my medical decision making, as well as reviewing the written report by the radiologist.  ED MD interpretation: No ICH, no DVT either arm  Official radiology report(s): CT Head Wo Contrast  Result Date: 04/01/2020 CLINICAL DATA:  Headache, classic migraine EXAM: CT HEAD WITHOUT CONTRAST TECHNIQUE: Contiguous axial images were obtained from the base of the skull through the vertex without intravenous contrast. COMPARISON:  10/29/2019 FINDINGS: Brain: No evidence of acute infarction, hemorrhage, hydrocephalus, extra-axial collection or mass lesion/mass effect. Vascular: No hyperdense vessel or unexpected calcification. Skull: Normal. Negative for  fracture or focal lesion. Sinuses/Orbits: No acute finding. IMPRESSION: Negative head CT. Electronically Signed   By: Marnee Spring M.D.   On: 04/01/2020 05:40   US Venous Img Upper Bilat  Result Date: 04/01/2020 CLINICAL DATA:  Evaluate for DVT, multiple intravenous access EXAM: BILATERAL UPPER EXTREMITY VENOUS DOPPLER ULTRASOUND TECHNIQUE: Gray-scale sonography with graded compression, as well as color Doppler and duplex ultrasound were performed to evaluate the bilateral upper extremity deep venous systems from the level of the subclavian vein and including the jugular, axillary, basilic, radial, ulnar and upper cephalic vein. Spectral Doppler was utilized to evaluate flow at rest and with distal augmentation maneuvers. COMPARISON:  Ultrasound 06/12/2019 FINDINGS: RIGHT UPPER EXTREMITY Internal Jugular  Vein: No evidence of thrombus. Normal compressibility, respiratory phasicity. Subclavian Vein: No evidence of thrombus. Normal respiratory phasicity. Axillary Vein: No evidence of thrombus. Normal compressibility, respiratory phasicity and response to augmentation. Cephalic Vein: No evidence of thrombus. Normal compressibility, respiratory phasicity and response to augmentation. Basilic Vein: No evidence of thrombus. Normal compressibility, respiratory phasicity and response to augmentation. Brachial Veins: No evidence of thrombus. Normal compressibility, respiratory phasicity and response to augmentation. Radial Veins: No evidence of thrombus.  Normal compressibility. Ulnar Veins: No evidence of thrombus.  Normal compressibility. Venous Reflux:  None visualized. Other Findings:  None visualized. LEFT UPPER EXTREMITY Internal Jugular Vein: No evidence of thrombus. Normal compressibility, respiratory phasicity. Subclavian Vein: No evidence of thrombus. Normal respiratory phasicity. Axillary Vein: No evidence of thrombus. Normal compressibility, respiratory phasicity and response to augmentation. Cephalic Vein: No evidence of thrombus. Normal compressibility, respiratory phasicity and response to augmentation. Basilic Vein: No evidence of thrombus. Normal compressibility, respiratory phasicity and response to augmentation. Brachial Veins: No evidence of thrombus. Normal compressibility, respiratory phasicity and response to augmentation. Radial Veins: No evidence of thrombus.  Normal compressibility. Ulnar Veins: No evidence of thrombus.  Normal compressibility. Venous Reflux:  None visualized. Other Findings:  None visualized. IMPRESSION: No evidence of DVT within either upper extremity. Electronically Signed   By: Kreg Shropshire M.D.   On: 04/01/2020 05:12    ____________________________________________   PROCEDURES  Procedure(s) performed (including Critical  Care):  Procedures   ____________________________________________   INITIAL IMPRESSION / ASSESSMENT AND PLAN / ED COURSE  As part of my medical decision making, I reviewed the following data within the electronic MEDICAL RECORD NUMBER Nursing notes reviewed and incorporated, Labs reviewed, Old chart reviewed, Radiograph reviewed, Notes from prior ED visits and [place] Controlled Substance Database     29 year old male presenting with feeling generally weak, hot with left-sided headache.  Differential diagnosis includes, but is not limited to, intracranial hemorrhage, meningitis/encephalitis, previous head trauma, cavernous venous thrombosis, tension headache, temporal arteritis, migraine or migraine equivalent, idiopathic intracranial hypertension, and non-specific headache.  I have personally reviewed patient's chart and see that he has had 10 ED visits to various facilities since 02/16/2020.  Those visits seem largely for chronic chest pain with shortness of breath.  He has had multiple IVs over the course of these visits with multiple negative Covid test.  Patient states he finally got a diagnosis of pectus excavatum.  Looks like the past 3 or 4 ED visits have been centered around his most current complaint.  Patient states he has not received a CT head. Will obtain CT head, administer oral medication, obtain BUE venous ultrasound to evaluate for DVT.   Clinical Course as of 04/01/20 7341  Mon Apr 01, 2020  0608 Updated patient and family member on laboratory and  imaging results.  Patient remains neurologically intact without focal deficits; neck remains supple.  He declined Compazine in the ED but accepts a prescription for it.  Also prescribed Naprosyn for superficial thrombophlebitis.  Advised warm compresses and to avoid IV insertion for the next several weeks if at all possible.  Will refer to neurology for outpatient follow-up of headaches.  Strict return precautions given.  Both verbalized  understanding agree with plan of care. [JS]    Clinical Course User Index [JS] Irean Hong, MD     ____________________________________________   FINAL CLINICAL IMPRESSION(S) / ED DIAGNOSES  Final diagnoses:  Weakness  Other migraine without status migrainosus, not intractable     ED Discharge Orders         Ordered    prochlorperazine (COMPAZINE) 10 MG tablet  Every 6 hours PRN        04/01/20 0610    naproxen (NAPROSYN) 500 MG tablet  2 times daily with meals        04/01/20 7494          *Please note:  TAELOR MONCADA was evaluated in Emergency Department on 04/01/2020 for the symptoms described in the history of present illness. He was evaluated in the context of the global COVID-19 pandemic, which necessitated consideration that the patient might be at risk for infection with the SARS-CoV-2 virus that causes COVID-19. Institutional protocols and algorithms that pertain to the evaluation of patients at risk for COVID-19 are in a state of rapid change based on information released by regulatory bodies including the CDC and federal and state organizations. These policies and algorithms were followed during the patient's care in the ED.  Some ED evaluations and interventions may be delayed as a result of limited staffing during and the pandemic.*   Note:  This document was prepared using Dragon voice recognition software and may include unintentional dictation errors.   Irean Hong, MD 04/01/20 [phone]

## 2020-04-01 NOTE — ED Triage Notes (Signed)
Patient states that he laid down about 30 minutes ago. Patient states that he started feeling weak,  hot and headache.

## 2020-04-01 NOTE — Discharge Instructions (Signed)
1.  You may take Compazine as needed for headache/nausea. 2.  Take Naprosyn twice daily x7 days for anti-inflammatory. 3.  Apply moist heat to affected areas on both arms several times daily to reduce swelling. 4.  Return to the ER for worsening symptoms, persistent vomiting, difficulty breathing or other concerns.

## 2020-04-02 ENCOUNTER — Emergency Department: Payer: Medicaid Other

## 2020-04-02 ENCOUNTER — Encounter: Payer: Self-pay | Admitting: Emergency Medicine

## 2020-04-02 ENCOUNTER — Emergency Department
Admission: EM | Admit: 2020-04-02 | Discharge: 2020-04-02 | Disposition: A | Discharge status: Home / Self Care | Payer: Medicaid Other | Attending: Emergency Medicine | Admitting: Emergency Medicine

## 2020-04-02 ENCOUNTER — Other Ambulatory Visit: Payer: Self-pay

## 2020-04-02 DIAGNOSIS — F17210 Nicotine dependence, cigarettes, uncomplicated: Secondary | ICD-10-CM | POA: Insufficient documentation

## 2020-04-02 DIAGNOSIS — Z79899 Other long term (current) drug therapy: Secondary | ICD-10-CM | POA: Insufficient documentation

## 2020-04-02 DIAGNOSIS — R0789 Other chest pain: Secondary | ICD-10-CM

## 2020-04-02 LAB — COMPREHENSIVE METABOLIC PANEL
ALT: 46 U/L — ABNORMAL HIGH (ref 0–44)
AST: 30 U/L (ref 15–41)
Albumin: 4.3 g/dL (ref 3.5–5.0)
Alkaline Phosphatase: 63 U/L (ref 38–126)
Anion gap: 11 (ref 5–15)
BUN: 13 mg/dL (ref 6–20)
CO2: 22 mmol/L (ref 22–32)
Calcium: 9 mg/dL (ref 8.9–10.3)
Chloride: 106 mmol/L (ref 98–111)
Creatinine, Ser: 1.05 mg/dL (ref 0.61–1.24)
GFR, Estimated: >60 mL/min (ref >60)
Glucose, Bld: 140 mg/dL — ABNORMAL HIGH (ref 70–99)
Potassium: 4.1 mmol/L (ref 3.5–5.1)
Sodium: 139 mmol/L (ref 135–145)
Total Bilirubin: 0.6 mg/dL (ref 0.3–1.2)
Total Protein: 7.5 g/dL (ref 6.5–8.1)

## 2020-04-02 LAB — CBC WITH DIFFERENTIAL/PLATELET
Abs Immature Granulocytes: 0.02 10*3/uL (ref 0.00–0.07)
Basophils Absolute: 0.1 10*3/uL (ref 0.0–0.1)
Basophils Relative: 1 %
Eosinophils Absolute: 0.3 10*3/uL (ref 0.0–0.5)
Eosinophils Relative: 3 %
HCT: 44.8 % (ref 39.0–52.0)
Hemoglobin: 15.4 g/dL (ref 13.0–17.0)
Immature Granulocytes: 0 %
Lymphocytes Relative: 53 %
Lymphs Abs: 4.7 10*3/uL — ABNORMAL HIGH (ref 0.7–4.0)
MCH: 29.3 pg (ref 26.0–34.0)
MCHC: 34.4 g/dL (ref 30.0–36.0)
MCV: 85.3 fL (ref 80.0–100.0)
Monocytes Absolute: 0.5 10*3/uL (ref 0.1–1.0)
Monocytes Relative: 6 %
Neutro Abs: 3.3 10*3/uL (ref 1.7–7.7)
Neutrophils Relative %: 37 %
Platelets: 215 10*3/uL (ref 150–400)
RBC: 5.25 MIL/uL (ref 4.22–5.81)
RDW: 12.3 % (ref 11.5–15.5)
WBC: 8.9 10*3/uL (ref 4.0–10.5)
nRBC: 0 % (ref 0.0–0.2)

## 2020-04-02 LAB — TROPONIN I (HIGH SENSITIVITY): Troponin I (High Sensitivity): 4 ng/L (ref <18)

## 2020-04-02 MED ORDER — PANTOPRAZOLE SODIUM 40 MG PO TBEC: 40.0000 mg | DELAYED_RELEASE_TABLET | Freq: Every day | ORAL | 1 refills | Status: DC

## 2020-04-02 MED ORDER — ALUM & MAG HYDROXIDE-SIMETH 400-400-40 MG/5ML PO SUSP: 5.0000 mL | Freq: Four times a day (QID) | ORAL | 0 refills | Status: AC | PRN

## 2020-04-02 NOTE — ED Triage Notes (Signed)
Pt to triage via w/c with no distress noted; pt reports he was here last night and was dx with blood clots in both arms (u/s report negative in chart); pt c/o left sided CP radiating into left arm accomp by [facility]

## 2020-04-02 NOTE — ED Provider Notes (Signed)
[facility] Emergency Department Provider Note  ____________________________________________  Time seen: Approximately 6:16 AM  I have reviewed the triage vital signs and the nursing notes.   HISTORY  Chief Complaint Chest Pain   HPI Patrick Huffman is a 29 y.o. male the history of chronic chest pain who presents for evaluation of chest pain.  Patient reports that he was trying to fall asleep last night when developed pretty severe sharp left-sided chest pain, he broke out in a sweat.  The pain lasted 30 minutes and resolve without intervention.  He said it was hard to breathe when the pain was going on.  Patient reports that he has chest pain a lot.  He reports that his chest pain is never when he is working out, exercising, or exerting himself.  He reports that his pain only comes when he is laying down relaxing.  Sometimes the pain is stabbing, sometimes it is pressure, sometimes it is sharp.  At this time he has no pain.  No abdominal pain, no nausea or vomiting.  Patient is a smoker.  Denies any drug use or excessive alcohol use.  Denies history of ulcers.  Denies hematemesis or melena.   Past Medical History:  Diagnosis Date  . Back pain   . Heart murmur   . Hernia, inguinal     Prior to Admission medications   Medication Sig Start Date End Date Taking? Authorizing Provider  albuterol (VENTOLIN HFA) 108 (90 Base) MCG/ACT inhaler Inhale 2 puffs into the lungs every 6 (six) hours as needed for wheezing or shortness of breath. 06/12/19   Concha Se, MD  alum & mag hydroxide-simeth (MAALOX MAX) 400-400-40 MG/5ML suspension Take 5 mLs by mouth every 6 (six) hours as needed for indigestion. 04/02/20   Nita Sickle, MD  cyclobenzaprine (FLEXERIL) 5 MG tablet 1 tablet every 8 hours as needed for muscle spasms 03/05/20   Irean Hong, MD  cyclobenzaprine (FLEXERIL) 5 MG tablet 1 tablet every 8 hours as needed for muscle spasms 03/05/20   Irean Hong, MD   famotidine (PEPCID) 20 MG tablet Take 1 tablet (20 mg total) by mouth 2 (two) times daily for 7 days. 10/05/19 10/12/19  Menshew, Charlesetta Ivory, PA-C  naproxen (NAPROSYN) 500 MG tablet Take 1 tablet (500 mg total) by mouth 2 (two) times daily with a meal. 04/01/20   Irean Hong, MD  pantoprazole (PROTONIX) 40 MG tablet Take 1 tablet (40 mg total) by mouth daily. 04/02/20 04/02/21  Nita Sickle, MD  prochlorperazine (COMPAZINE) 10 MG tablet Take 1 tablet (10 mg total) by mouth every 6 (six) hours as needed for nausea (headache). 04/01/20   Irean Hong, MD  cetirizine (ZYRTEC) 10 MG tablet Take 1 tablet (10 mg total) by mouth daily. Patient not taking: Reported on 11/26/2018 01/27/16 10/05/19  Cuthriell, Delorise Royals, PA-C  dicyclomine (BENTYL) 20 MG tablet Take 1 tablet (20 mg total) by mouth 3 (three) times daily as needed for spasms. 04/12/19 10/05/19  Minna Antis, MD  loratadine (CLARITIN) 10 MG tablet Take 1 tablet (10 mg total) by mouth daily for 14 days. 09/13/18 10/05/19  Shaune Pollack, MD  promethazine (PHENERGAN) 25 MG tablet Take 1 tablet (25 mg total) by mouth every 6 (six) hours as needed for nausea or vomiting. Patient not taking: Reported on 11/26/2018 05/07/16 10/05/19  Evangeline Dakin, PA-C    Allergies No known allergies  No family history on file.  Social History Social  History   Tobacco Use  . Smoking status: Current Every Day Smoker    Packs/day: 0.50    Types: Cigarettes  . Smokeless tobacco: Never Used  Vaping Use  . Vaping Use: Never used  Substance Use Topics  . Alcohol use: Yes  . Drug use: No    Review of Systems  Constitutional: Negative for fever. Eyes: Negative for visual changes. ENT: Negative for sore throat. Neck: No neck pain  Cardiovascular: + chest pain. Respiratory: Negative for shortness of breath. Gastrointestinal: Negative for abdominal pain, vomiting or diarrhea. Genitourinary: Negative for dysuria. Musculoskeletal: Negative for  back pain. Skin: Negative for rash. Neurological: Negative for headaches, weakness or numbness. Psych: No SI or HI  ____________________________________________   PHYSICAL EXAM:  VITAL SIGNS: ED Triage Vitals [04/02/20 0328]  Enc Vitals Group     BP 134/89     Pulse Rate 85     Resp 18     Temp 98 F (36.7 C)     Temp Source Oral     SpO2 99 %     Weight 200 lb (90.7 kg)     Height 6' (1.829 m)     Head Circumference      Peak Flow      Pain Score 8     Pain Loc      Pain Edu?      Excl. in GC?     Constitutional: Alert and oriented. Well appearing and in no apparent distress. HEENT:      Head: Normocephalic and atraumatic.         Eyes: Conjunctivae are normal. Sclera is non-icteric.       Mouth/Throat: Mucous membranes are moist.       Neck: Supple with no signs of meningismus. Cardiovascular: Regular rate and rhythm. No murmurs, gallops, or rubs. 2+ symmetrical distal pulses are present in all extremities. No JVD. Respiratory: Normal respiratory effort. Lungs are clear to auscultation bilaterally.  Gastrointestinal: Soft, non tender, and non distended. Musculoskeletal: No edema, cyanosis, or erythema of extremities. Neurologic: Normal speech and language. Face is symmetric. Moving all extremities. No gross focal neurologic deficits are appreciated. Skin: Skin is warm, dry and intact. No rash noted. Psychiatric: Mood and affect are normal. Speech and behavior are normal.  ____________________________________________   LABS (all labs ordered are listed, but only abnormal results are displayed)  Labs Reviewed  CBC WITH DIFFERENTIAL/PLATELET - Abnormal; Notable for the following components:      Result Value   Lymphs Abs 4.7 (*)    All other components within normal limits  COMPREHENSIVE METABOLIC PANEL - Abnormal; Notable for the following components:   Glucose, Bld 140 (*)    ALT 46 (*)    All other components within normal limits  TROPONIN I (HIGH  SENSITIVITY)  TROPONIN I (HIGH SENSITIVITY)   ____________________________________________  EKG  ED ECG REPORT I, Nita Sickle, the attending physician, personally viewed and interpreted this ECG.  Normal sinus rhythm, rate of 86, normal intervals, normal axis, no ST elevations or depressions.  Normal EKG. ____________________________________________  RADIOLOGY  I have personally reviewed the images performed during this visit and I agree with the Radiologist's read.   Interpretation by Radiologist:  DG Chest 2 View  Result Date: 04/02/2020 CLINICAL DATA:  Left-sided chest pain. EXAM: CHEST - 2 VIEW COMPARISON:  March 25, 2020 FINDINGS: The heart size and mediastinal contours are within normal limits. Both lungs are clear. The visualized skeletal structures are unremarkable. IMPRESSION: No  active cardiopulmonary disease. Electronically Signed   By: Aram Candelahaddeus  Houston M.D.   On: 04/02/2020 03:52     ____________________________________________   PROCEDURES  Procedure(s) performed:yes .1-3 Lead EKG Interpretation Performed by: Nita SickleVeronese, [place], MD Authorized by: Nita SickleVeronese, [place], MD     Interpretation: normal     ECG rate assessment: normal     Rhythm: sinus rhythm     Ectopy: none     Critical Care performed: none ____________________________________________   INITIAL IMPRESSION / ASSESSMENT AND PLAN / ED COURSE   29 y.o. male the history of chronic chest pain who presents for evaluation of chest pain.  Review of old medical records shows the patient comes frequently to the emergency room for episodes of chest pain.  Has been seen by cardiologist.  According to the patient has had a stress test which I can see on his records being done on August 2021 but I am unable to see the results.  Patient tells me that the track stress test was negative.  He reports that the pain is only present at rest when he is laying down relaxing.  It sounds more like esophageal  spasms.  Very atypical for ACS.  Has had several EKGs and high-sensitivity troponins which are always negative.  No tachypnea, no tachycardia, no hypoxia, PERC negative with no history of PE or DVT.  Patient was seen here yesterday and was diagnosed with superficial thrombophlebitis with a negative ultrasound.  The site was reevaluated by me today.  Patient has a very tiny amount of bruising at the site of an IV with no palpable cord, no signs of active thrombophlebitis.  Will discharge patient with Maalox and Protonix.  Patient has an appointment with his cardiologist coming up.  Recommended keeping that appointment and if cleared by cardiology to follow-up with GI.  His EKG here shows no acute ischemic changes.  He had 2 high-sensitivity troponins which are negative.  His chest x-ray was visualized by me and normal, confirmed by radiology.  He is otherwise pain-free with normal labs.  Glucose is slightly elevated at 140 however that is a nonfasting glucose.  Recommended follow-up with PCP for a hemoglobin A1c and a fasting blood glucose.  Discussed my standard return precautions.      _____________________________________________ Please note:  Patient was evaluated in Emergency Department today for the symptoms described in the history of present illness. Patient was evaluated in the context of the global COVID-19 pandemic, which necessitated consideration that the patient might be at risk for infection with the SARS-CoV-2 virus that causes COVID-19. Institutional protocols and algorithms that pertain to the evaluation of patients at risk for COVID-19 are in a state of rapid change based on information released by regulatory bodies including the CDC and federal and state organizations. These policies and algorithms were followed during the patient's care in the ED.  Some ED evaluations and interventions may be delayed as a result of limited staffing during the pandemic.   [place] Controlled Substance Database  was reviewed by me. ____________________________________________   FINAL CLINICAL IMPRESSION(S) / ED DIAGNOSES   Final diagnoses:  Atypical chest pain      NEW MEDICATIONS STARTED DURING THIS VISIT:  ED Discharge Orders         Ordered    alum & mag hydroxide-simeth (MAALOX MAX) 400-400-40 MG/5ML suspension  Every 6 hours PRN        04/02/20 0625    pantoprazole (PROTONIX) 40 MG tablet  Daily  04/02/20 1062           Note:  This document was prepared using Dragon voice recognition software and may include unintentional dictation errors.    Nita Sickle, MD 04/02/20 [phone]

## 2020-04-02 NOTE — Discharge Instructions (Addendum)
As we discussed, considering all the EKGs, blood work, stress test that you have done in the past that this is very unlikely to be coming from your heart.  However you should keep your appointment with your cardiologist on January 6.  I do believe your symptoms could be due to esophageal spasms which are usually a sign of silent reflux.  Make sure to start taking Protonix as prescribed.  You may take Maalox when you have severe pain.  If you have any new or worsening chest pain, shortness of breath, or fever please return to the emergency room.  Once cleared by your cardiologist you may benefit from a GI evaluation.  Your primary care doctor can refer you to a GI specialist.

## 2020-04-06 ENCOUNTER — Other Ambulatory Visit: Payer: Self-pay

## 2020-04-06 ENCOUNTER — Emergency Department
Admission: EM | Admit: 2020-04-06 | Discharge: 2020-04-06 | Disposition: A | Discharge status: Home / Self Care | Payer: Medicaid Other | Attending: Emergency Medicine | Admitting: Emergency Medicine

## 2020-04-06 ENCOUNTER — Encounter: Payer: Self-pay | Admitting: Emergency Medicine

## 2020-04-06 ENCOUNTER — Emergency Department: Payer: Medicaid Other

## 2020-04-06 DIAGNOSIS — R079 Chest pain, unspecified: Secondary | ICD-10-CM | POA: Insufficient documentation

## 2020-04-06 DIAGNOSIS — F17210 Nicotine dependence, cigarettes, uncomplicated: Secondary | ICD-10-CM | POA: Insufficient documentation

## 2020-04-06 DIAGNOSIS — Z20822 Contact with and (suspected) exposure to covid-19: Secondary | ICD-10-CM | POA: Insufficient documentation

## 2020-04-06 DIAGNOSIS — R0602 Shortness of breath: Secondary | ICD-10-CM | POA: Insufficient documentation

## 2020-04-06 DIAGNOSIS — M791 Myalgia, unspecified site: Secondary | ICD-10-CM | POA: Insufficient documentation

## 2020-04-06 DIAGNOSIS — R5383 Other fatigue: Secondary | ICD-10-CM | POA: Insufficient documentation

## 2020-04-06 DIAGNOSIS — R519 Headache, unspecified: Secondary | ICD-10-CM | POA: Insufficient documentation

## 2020-04-06 LAB — BASIC METABOLIC PANEL
Anion gap: 10 (ref 5–15)
BUN: 10 mg/dL (ref 6–20)
CO2: 27 mmol/L (ref 22–32)
Calcium: 9 mg/dL (ref 8.9–10.3)
Chloride: 104 mmol/L (ref 98–111)
Creatinine, Ser: 1.1 mg/dL (ref 0.61–1.24)
GFR, Estimated: >60 mL/min (ref >60)
Glucose, Bld: 127 mg/dL — ABNORMAL HIGH (ref 70–99)
Potassium: 3.8 mmol/L (ref 3.5–5.1)
Sodium: 141 mmol/L (ref 135–145)

## 2020-04-06 LAB — RESP PANEL BY RT-PCR (FLU A&B, COVID) ARPGX2
Influenza A by PCR: NEGATIVE
Influenza B by PCR: NEGATIVE
SARS Coronavirus 2 by RT PCR: NEGATIVE

## 2020-04-06 LAB — HEPATIC FUNCTION PANEL
ALT: 41 U/L (ref 0–44)
AST: 25 U/L (ref 15–41)
Albumin: 4.1 g/dL (ref 3.5–5.0)
Alkaline Phosphatase: 57 U/L (ref 38–126)
Bilirubin, Direct: <0.1 mg/dL (ref 0.0–0.2)
Total Bilirubin: 0.4 mg/dL (ref 0.3–1.2)
Total Protein: 7.1 g/dL (ref 6.5–8.1)

## 2020-04-06 LAB — CBC
HCT: 43.6 % (ref 39.0–52.0)
Hemoglobin: 14.5 g/dL (ref 13.0–17.0)
MCH: 29.2 pg (ref 26.0–34.0)
MCHC: 33.3 g/dL (ref 30.0–36.0)
MCV: 87.7 fL (ref 80.0–100.0)
Platelets: 207 10*3/uL (ref 150–400)
RBC: 4.97 MIL/uL (ref 4.22–5.81)
RDW: 12.1 % (ref 11.5–15.5)
WBC: 7.9 10*3/uL (ref 4.0–10.5)
nRBC: 0 % (ref 0.0–0.2)

## 2020-04-06 LAB — TROPONIN I (HIGH SENSITIVITY)
Troponin I (High Sensitivity): 3 ng/L (ref <18)
Troponin I (High Sensitivity): 3 ng/L (ref <18)

## 2020-04-06 LAB — PROCALCITONIN: Procalcitonin: <0.1 ng/mL

## 2020-04-06 LAB — HIV ANTIBODY (ROUTINE TESTING W REFLEX): HIV Screen 4th Generation wRfx: NONREACTIVE

## 2020-04-06 MED ORDER — ACETAMINOPHEN 500 MG PO TABS
1000.0000 mg | ORAL_TABLET | Freq: Once | ORAL | Status: AC
  Administered 2020-04-06: 05:00:00 1000 mg via ORAL
  Filled 2020-04-06: qty 2

## 2020-04-06 MED ORDER — IBUPROFEN 400 MG PO TABS
400.0000 mg | ORAL_TABLET | Freq: Once | ORAL | Status: AC
  Administered 2020-04-06: 05:00:00 400 mg via ORAL
  Filled 2020-04-06: qty 1

## 2020-04-06 NOTE — ED Notes (Signed)
Woman wheeling pt out of ed lobby doors without speaking to staff.

## 2020-04-06 NOTE — ED Provider Notes (Addendum)
[facility] Emergency Department Provider Note  ____________________________________________   Event Date/Time   First MD Initiated Contact with Patient 04/06/20 0249     (approximate)  I have reviewed the triage vital signs and the nursing notes.   HISTORY  Chief Complaint Chest Pain and Shortness of Breath   HPI Patrick Huffman is a 29 y.o. male with past medical history of several months of intermittent episodes of chest pain associate with shortness of breath, myalgias, fatigue and sweats as well as headache.  Patient states he had an episode today felt particularly bad.  He states he often hears his chest crack when he takes a deep breath and is worried he may have pectus excavatum.  No recent injuries or falls.  He was at rest when his pain started and his symptoms slightly improved.  Is not clearly exertional or positional.  He denies any cough, fevers, nausea, vomiting, diarrhea, Donnell pain, back pain or acute extremity focal pain.  No recent rashes or urinary symptoms.  He denies EtOH or illicit drug use but does endorse tobacco abuse.  States he has not yet had a chance to see a PCP for the symptoms but they have all been present for least several months.         Past Medical History:  Diagnosis Date  . Back pain   . Heart murmur   . Hernia, inguinal     There are no problems to display for this patient.   History reviewed. No pertinent surgical history.  Prior to Admission medications   Medication Sig Start Date End Date Taking? Authorizing Provider  albuterol (VENTOLIN HFA) 108 (90 Base) MCG/ACT inhaler Inhale 2 puffs into the lungs every 6 (six) hours as needed for wheezing or shortness of breath. 06/12/19   Concha SeFunke, Mary E, MD  alum & mag hydroxide-simeth (MAALOX MAX) 400-400-40 MG/5ML suspension Take 5 mLs by mouth every 6 (six) hours as needed for indigestion. 04/02/20   Nita SickleVeronese, [place], MD  cyclobenzaprine (FLEXERIL) 5 MG tablet 1  tablet every 8 hours as needed for muscle spasms 03/05/20   Irean HongSung, Jade J, MD  cyclobenzaprine (FLEXERIL) 5 MG tablet 1 tablet every 8 hours as needed for muscle spasms 03/05/20   Irean HongSung, Jade J, MD  famotidine (PEPCID) 20 MG tablet Take 1 tablet (20 mg total) by mouth 2 (two) times daily for 7 days. 10/05/19 10/12/19  Menshew, Charlesetta IvoryJenise V Bacon, PA-C  naproxen (NAPROSYN) 500 MG tablet Take 1 tablet (500 mg total) by mouth 2 (two) times daily with a meal. 04/01/20   Irean HongSung, Jade J, MD  pantoprazole (PROTONIX) 40 MG tablet Take 1 tablet (40 mg total) by mouth daily. 04/02/20 04/02/21  Nita SickleVeronese, Justice, MD  prochlorperazine (COMPAZINE) 10 MG tablet Take 1 tablet (10 mg total) by mouth every 6 (six) hours as needed for nausea (headache). 04/01/20   Irean HongSung, Jade J, MD  cetirizine (ZYRTEC) 10 MG tablet Take 1 tablet (10 mg total) by mouth daily. Patient not taking: Reported on 11/26/2018 01/27/16 10/05/19  Cuthriell, Delorise RoyalsJonathan D, PA-C  dicyclomine (BENTYL) 20 MG tablet Take 1 tablet (20 mg total) by mouth 3 (three) times daily as needed for spasms. 04/12/19 10/05/19  Minna AntisPaduchowski, Kevin, MD  loratadine (CLARITIN) 10 MG tablet Take 1 tablet (10 mg total) by mouth daily for 14 days. 09/13/18 10/05/19  Shaune PollackIsaacs, Cameron, MD  promethazine (PHENERGAN) 25 MG tablet Take 1 tablet (25 mg total) by mouth every 6 (six) hours as needed for  nausea or vomiting. Patient not taking: Reported on 11/26/2018 05/07/16 10/05/19  Evangeline Dakin, PA-C    Allergies No known allergies  History reviewed. No pertinent family history.  Social History Social History   Tobacco Use  . Smoking status: Current Every Day Smoker    Packs/day: 0.50    Types: Cigarettes  . Smokeless tobacco: Never Used  Vaping Use  . Vaping Use: Never used  Substance Use Topics  . Alcohol use: Yes  . Drug use: No    Review of Systems  Review of Systems  Constitutional: Positive for chills, diaphoresis and malaise/fatigue. Negative for fever.  HENT: Negative  for sore throat.   Eyes: Negative for pain.  Respiratory: Positive for shortness of breath. Negative for cough and stridor.   Cardiovascular: Positive for chest pain.  Gastrointestinal: Negative for vomiting.  Genitourinary: Negative for dysuria.  Musculoskeletal: Negative for myalgias.  Skin: Negative for rash.  Neurological: Negative for seizures, loss of consciousness and headaches.  Psychiatric/Behavioral: Negative for suicidal ideas.  All other systems reviewed and are negative.     ____________________________________________   PHYSICAL EXAM:  VITAL SIGNS: ED Triage Vitals  Enc Vitals Group     BP 04/06/20 0155 (!) 117/98     Pulse Rate 04/06/20 0155 70     Resp 04/06/20 0155 16     Temp 04/06/20 0155 98.7 F (37.1 C)     Temp Source 04/06/20 0155 Oral     SpO2 04/06/20 0155 100 %     Weight 04/06/20 0151 200 lb (90.7 kg)     Height 04/06/20 0151 6' (1.829 m)     Head Circumference --      Peak Flow --      Pain Score 04/06/20 0151 7     Pain Loc --      Pain Edu? --      Excl. in GC? --    Vitals:   04/06/20 0155 04/06/20 0433  BP: (!) 117/98 134/82  Pulse: 70 73  Resp: 16 20  Temp: 98.7 F (37.1 C) 98.1 F (36.7 C)  SpO2: 100% 98%   Physical Exam Vitals and nursing note reviewed.  Constitutional:      Appearance: He is well-developed and well-nourished.  HENT:     Head: Normocephalic and atraumatic.     Right Ear: External ear normal.     Left Ear: External ear normal.     Nose: Nose normal.  Eyes:     Conjunctiva/sclera: Conjunctivae normal.  Cardiovascular:     Rate and Rhythm: Normal rate and regular rhythm.     Heart sounds: No murmur heard.   Pulmonary:     Effort: Pulmonary effort is normal. No respiratory distress.     Breath sounds: Normal breath sounds.  Abdominal:     Palpations: Abdomen is soft.     Tenderness: There is no abdominal tenderness.  Musculoskeletal:        General: No edema.     Cervical back: Neck supple.      Right lower leg: No edema.     Left lower leg: No edema.  Skin:    General: Skin is warm and dry.  Neurological:     Mental Status: He is alert and oriented to person, place, and time.  Psychiatric:        Mood and Affect: Mood and affect and mood normal.     Some mild tenderness at the base of the xiphoid and over the left  costochondral joints ____________________________________________   LABS (all labs ordered are listed, but only abnormal results are displayed)  Labs Reviewed  BASIC METABOLIC PANEL - Abnormal; Notable for the following components:      Result Value   Glucose, Bld 127 (*)    All other components within normal limits  RESP PANEL BY RT-PCR (FLU A&B, COVID) ARPGX2  CBC  HEPATIC FUNCTION PANEL  PROCALCITONIN  HIV ANTIBODY (ROUTINE TESTING W REFLEX)  TROPONIN I (HIGH SENSITIVITY)  TROPONIN I (HIGH SENSITIVITY)   ____________________________________________  EKG  Sinus rhythm with a ventricular rate of 68, normal axis, unremarkable intervals, no evidence of acute ischemia or significant delay arrhythmia. ____________________________________________  RADIOLOGY  ED MD interpretation: Chest x-ray shows no evidence of pneumothorax, focal consolidation, rib fracture, large effusion, overt edema, widened mediastinum or other acute intrathoracic process.  Official radiology report(s): DG Chest 2 View  Result Date: 04/06/2020 CLINICAL DATA:  Chest pain which began 30 minutes prior to admission, radiation to left arm, shortness of breath EXAM: CHEST - 2 VIEW COMPARISON:  Radiograph 04/02/2020 FINDINGS: No consolidation, features of edema, pneumothorax, or effusion. Pulmonary vascularity is normally distributed. The cardiomediastinal contours are unremarkable. No acute osseous or soft tissue abnormality. IMPRESSION: No acute cardiopulmonary abnormality. Electronically Signed   By: Kreg Shropshire M.D.   On: 04/06/2020 02:24     ____________________________________________   PROCEDURES  Procedure(s) performed (including Critical Care):  .1-3 Lead EKG Interpretation Performed by: Gilles Chiquito, MD Authorized by: Gilles Chiquito, MD     Interpretation: normal     ECG rate assessment: normal     Rhythm: sinus rhythm     Ectopy: none     Conduction: normal       ____________________________________________   INITIAL IMPRESSION / ASSESSMENT AND PLAN / ED COURSE       Patient presents with above stated history and exam for assessment of some recurrent episodes most recently this afternoon of chest pain associate with shortness of breath cold sweats headache and fatigue.  On arrival he is afebrile and hemodynamically stable.  It seems these episodes have been ongoing over the last several months however patient has not had opportunity follow-up with his PCP.  Differential includes but is not limited to ACS, PE, arrhythmia, anemia, pneumonia, spontaneous pneumothorax, dissection, metabolic derangements, pleurisy, myocarditis, and chest wall pain i.e. costochondritis versus other etiologies.  ECG shows no evidence of ischemia and given nonelevated troponin x2, very low suspicion for ACS at this time.  Low suspicion for PE as patient is PERC negative.  ECG shows no evidence of arrhythmia.  CBC shows no evidence of acute anemia or leukocytosis.  Troponin and ECG and history are not consistent with myocarditis.  Given recurrent episodes is less likely pleurisy I am more concerned for possible chest wall etiologies including costochondritis.  Covid PCR was sent as well as HIV and advised patient he can follow-up these results online but that regardless even if both are negative patient scheduled for appointment with his PCP in the next week as the seem to be recurrent and very bothersome symptoms with the patient.  Advised him that I believe he is safe to take Tylenol ibuprofen at this time.  Discharge stable  condition.  Strict return precautions advised and discussed.   ____________________________________________   FINAL CLINICAL IMPRESSION(S) / ED DIAGNOSES  Final diagnoses:  Chest pain, unspecified type    Medications  acetaminophen (TYLENOL) tablet 1,000 mg (1,000 mg Oral Given 04/06/20 0431)  ibuprofen (  ADVIL) tablet 400 mg (400 mg Oral Given 04/06/20 0431)     ED Discharge Orders    None       Note:  This document was prepared using Dragon voice recognition software and may include unintentional dictation errors.   Gilles Chiquito, MD 04/06/20 0092    Gilles Chiquito, MD 04/06/20 [phone]

## 2020-04-06 NOTE — ED Notes (Signed)
Pt observed leaving room - when asked where he was going he states he needed to obtain phone charger to reach his kids -- pt does not appear to be in any acute distress.  RR even and unlabored - gait steady.  Dr Katrinka Blazing made aware

## 2020-04-06 NOTE — ED Triage Notes (Signed)
Pt to ED from home c/o left CP like someone standing on his chest that started approx 30 min PTA and pain radiating to left arm, accomp with SOB.  States was lying down trying to sleep when started.  Denies recent illness, fevers, n/v/d, or cardiac hx.  Pt A&Ox4, chest rise even and unlabored, skin pale and dry, in NAD at this time.

## 2020-04-06 NOTE — ED Notes (Signed)
Pt agreeable with d/c plan as discussed by provider- this nurse has verbally reinforced d/c instructions and provided pt with written copy -pt acknowledges verbal understanding and denies any additional questions concerns needs - ambulatory at discharge; no acute distress with steady gait escorted by significant other

## 2020-04-07 ENCOUNTER — Other Ambulatory Visit: Payer: Self-pay

## 2020-04-07 ENCOUNTER — Emergency Department
Admission: EM | Admit: 2020-04-07 | Discharge: 2020-04-07 | Disposition: A | Discharge status: Home / Self Care | Payer: Medicaid Other | Attending: Emergency Medicine | Admitting: Emergency Medicine

## 2020-04-07 DIAGNOSIS — R0789 Other chest pain: Secondary | ICD-10-CM

## 2020-04-07 DIAGNOSIS — Z79899 Other long term (current) drug therapy: Secondary | ICD-10-CM | POA: Insufficient documentation

## 2020-04-07 DIAGNOSIS — F17210 Nicotine dependence, cigarettes, uncomplicated: Secondary | ICD-10-CM | POA: Insufficient documentation

## 2020-04-07 LAB — BASIC METABOLIC PANEL
Anion gap: 8 (ref 5–15)
BUN: 16 mg/dL (ref 6–20)
CO2: 26 mmol/L (ref 22–32)
Calcium: 9.1 mg/dL (ref 8.9–10.3)
Chloride: 101 mmol/L (ref 98–111)
Creatinine, Ser: 1.13 mg/dL (ref 0.61–1.24)
GFR, Estimated: >60 mL/min (ref >60)
Glucose, Bld: 104 mg/dL — ABNORMAL HIGH (ref 70–99)
Potassium: 3.9 mmol/L (ref 3.5–5.1)
Sodium: 135 mmol/L (ref 135–145)

## 2020-04-07 LAB — CBC
HCT: 44 % (ref 39.0–52.0)
Hemoglobin: 14.7 g/dL (ref 13.0–17.0)
MCH: 29 pg (ref 26.0–34.0)
MCHC: 33.4 g/dL (ref 30.0–36.0)
MCV: 86.8 fL (ref 80.0–100.0)
Platelets: 208 10*3/uL (ref 150–400)
RBC: 5.07 MIL/uL (ref 4.22–5.81)
RDW: 12.1 % (ref 11.5–15.5)
WBC: 8.3 10*3/uL (ref 4.0–10.5)
nRBC: 0 % (ref 0.0–0.2)

## 2020-04-07 LAB — TROPONIN I (HIGH SENSITIVITY): Troponin I (High Sensitivity): <2 ng/L (ref <18)

## 2020-04-07 NOTE — ED Provider Notes (Signed)
[facility] Emergency Department Provider Note   ____________________________________________    I have reviewed the triage vital signs and the nursing notes.   HISTORY  Chief Complaint Chest Pain     HPI Patrick Huffman is a 29 y.o. male with history as noted below who presents with complaints of chest pain.  Patient reports he was watching TV earlier, felt a discomfort in his chest and felt hot all over.  Called EMS and presented to the emergency department.  Currently feels well and has no complaints.  Notably this is the patient's ninth ED visit this month for similar symptoms.  He does have a cardiologist that he has seen and had a negative stress test in the past.  Did not take anything for this  Past Medical History:  Diagnosis Date  . Back pain   . Heart murmur   . Hernia, inguinal     There are no problems to display for this patient.   No past surgical history on file.  Prior to Admission medications   Medication Sig Start Date End Date Taking? Authorizing Provider  albuterol (VENTOLIN HFA) 108 (90 Base) MCG/ACT inhaler Inhale 2 puffs into the lungs every 6 (six) hours as needed for wheezing or shortness of breath. 06/12/19   Concha Se, MD  alum & mag hydroxide-simeth (MAALOX MAX) 400-400-40 MG/5ML suspension Take 5 mLs by mouth every 6 (six) hours as needed for indigestion. 04/02/20   Nita Sickle, MD  cyclobenzaprine (FLEXERIL) 5 MG tablet 1 tablet every 8 hours as needed for muscle spasms 03/05/20   Irean Hong, MD  cyclobenzaprine (FLEXERIL) 5 MG tablet 1 tablet every 8 hours as needed for muscle spasms 03/05/20   Irean Hong, MD  famotidine (PEPCID) 20 MG tablet Take 1 tablet (20 mg total) by mouth 2 (two) times daily for 7 days. 10/05/19 10/12/19  Menshew, Charlesetta Ivory, PA-C  naproxen (NAPROSYN) 500 MG tablet Take 1 tablet (500 mg total) by mouth 2 (two) times daily with a meal. 04/01/20   Irean Hong, MD  pantoprazole  (PROTONIX) 40 MG tablet Take 1 tablet (40 mg total) by mouth daily. 04/02/20 04/02/21  Nita Sickle, MD  prochlorperazine (COMPAZINE) 10 MG tablet Take 1 tablet (10 mg total) by mouth every 6 (six) hours as needed for nausea (headache). 04/01/20   Irean Hong, MD  cetirizine (ZYRTEC) 10 MG tablet Take 1 tablet (10 mg total) by mouth daily. Patient not taking: Reported on 11/26/2018 01/27/16 10/05/19  Cuthriell, Delorise Royals, PA-C  dicyclomine (BENTYL) 20 MG tablet Take 1 tablet (20 mg total) by mouth 3 (three) times daily as needed for spasms. 04/12/19 10/05/19  Minna Antis, MD  loratadine (CLARITIN) 10 MG tablet Take 1 tablet (10 mg total) by mouth daily for 14 days. 09/13/18 10/05/19  Shaune Pollack, MD  promethazine (PHENERGAN) 25 MG tablet Take 1 tablet (25 mg total) by mouth every 6 (six) hours as needed for nausea or vomiting. Patient not taking: Reported on 11/26/2018 05/07/16 10/05/19  Evangeline Dakin, PA-C     Allergies No known allergies  No family history on file.  Social History Social History   Tobacco Use  . Smoking status: Current Every Day Smoker    Packs/day: 0.50    Types: Cigarettes  . Smokeless tobacco: Never Used  Vaping Use  . Vaping Use: Never used  Substance Use Topics  . Alcohol use: Yes  . Drug use: No  Review of Systems  Constitutional: No fever/chills Eyes: No visual changes.  ENT: No sore throat. Cardiovascular: As above Respiratory: Denies shortness of breath. Gastrointestinal: No abdominal pain.  No nausea, no vomiting.   Genitourinary: Negative for dysuria. Musculoskeletal: Negative for back pain. Skin: Negative for rash. Neurological: Negative for headaches or weakness   ____________________________________________   PHYSICAL EXAM:  VITAL SIGNS: ED Triage Vitals  Enc Vitals Group     BP 04/07/20 0311 118/81     Pulse Rate 04/07/20 0311 82     Resp 04/07/20 0311 20     Temp 04/07/20 0311 98.7 F (37.1 C)     Temp Source  04/07/20 0311 Oral     SpO2 04/07/20 0311 99 %     Weight --      Height --      Head Circumference --      Peak Flow --      Pain Score 04/07/20 0318 7     Pain Loc --      Pain Edu? --      Excl. in GC? --     Constitutional: Alert and oriented.  No acute distress  Mouth/Throat: Mucous membranes are moist.    Cardiovascular: Normal rate, regular rhythm. Grossly normal heart sounds.  Good peripheral circulation. Respiratory: Normal respiratory effort.  No retractions. Lungs CTAB. Gastrointestinal: Soft and nontender. No distention.  No CVA tenderness.  Musculoskeletal: No lower extremity tenderness nor edema.  Warm and well perfused Neurologic:  Normal speech and language. No gross focal neurologic deficits are appreciated.  Skin:  Skin is warm, dry and intact. No rash noted. Psychiatric: Mood and affect are normal. Speech and behavior are normal.  ____________________________________________   LABS (all labs ordered are listed, but only abnormal results are displayed)  Labs Reviewed  BASIC METABOLIC PANEL - Abnormal; Notable for the following components:      Result Value   Glucose, Bld 104 (*)    All other components within normal limits  CBC  TROPONIN I (HIGH SENSITIVITY)  TROPONIN I (HIGH SENSITIVITY)   ____________________________________________  EKG  ED ECG REPORT I, Jene Every, the attending physician, personally viewed and interpreted this ECG.  Date: 04/07/2020  Rhythm: normal sinus rhythm QRS Axis: normal Intervals: normal ST/T Wave abnormalities: normal Narrative Interpretation: no evidence of acute ischemia  ____________________________________________  RADIOLOGY  None ____________________________________________   PROCEDURES  Procedure(s) performed: No  Procedures   Critical Care performed: No ____________________________________________   INITIAL IMPRESSION / ASSESSMENT AND PLAN / ED COURSE  Pertinent labs & imaging results  that were available during my care of the patient were reviewed by me and considered in my medical decision making (see chart for details).  Patient well-appearing and in no acute distress, asymptomatic here in the emergency department, has had multiple visits for similar complaints both at our hospital on [facility].  No indication for further work-up at this time, undetectable high sensitive troponin, lab work unremarkable.  Strongly encouraged him to follow-up closely with his cardiologist    ____________________________________________   FINAL CLINICAL IMPRESSION(S) / ED DIAGNOSES  Final diagnoses:  Atypical chest pain        Note:  This document was prepared using Dragon voice recognition software and may include unintentional dictation errors.   Jene Every, MD 04/07/20 [phone]

## 2020-04-07 NOTE — ED Triage Notes (Signed)
Pt states coming in with chest pain. Pt states chest pain for 3 weeks and keeps coming in because he keeps getting warm, hot and chest pain with pain to the left arm at times.  Pt states his "chest plate" pops when moving around. Pt states he does see a cardiologist and his stress test "was good."

## 2020-04-14 ENCOUNTER — Emergency Department: Payer: HRSA Program

## 2020-04-14 ENCOUNTER — Other Ambulatory Visit: Payer: Self-pay

## 2020-04-14 ENCOUNTER — Emergency Department
Admission: EM | Admit: 2020-04-14 | Discharge: 2020-04-14 | Disposition: A | Discharge status: Home / Self Care | Payer: HRSA Program | Attending: Emergency Medicine | Admitting: Emergency Medicine

## 2020-04-14 ENCOUNTER — Encounter: Payer: Self-pay | Admitting: Emergency Medicine

## 2020-04-14 DIAGNOSIS — F17210 Nicotine dependence, cigarettes, uncomplicated: Secondary | ICD-10-CM | POA: Diagnosis not present

## 2020-04-14 DIAGNOSIS — R0789 Other chest pain: Secondary | ICD-10-CM | POA: Insufficient documentation

## 2020-04-14 DIAGNOSIS — R059 Cough, unspecified: Secondary | ICD-10-CM | POA: Diagnosis present

## 2020-04-14 DIAGNOSIS — Z20822 Contact with and (suspected) exposure to covid-19: Secondary | ICD-10-CM | POA: Insufficient documentation

## 2020-04-14 LAB — COMPREHENSIVE METABOLIC PANEL
ALT: 51 U/L — ABNORMAL HIGH (ref 0–44)
AST: 29 U/L (ref 15–41)
Albumin: 4.3 g/dL (ref 3.5–5.0)
Alkaline Phosphatase: 61 U/L (ref 38–126)
Anion gap: 7 (ref 5–15)
BUN: 16 mg/dL (ref 6–20)
CO2: 27 mmol/L (ref 22–32)
Calcium: 8.9 mg/dL (ref 8.9–10.3)
Chloride: 103 mmol/L (ref 98–111)
Creatinine, Ser: 0.95 mg/dL (ref 0.61–1.24)
GFR, Estimated: >60 mL/min (ref >60)
Glucose, Bld: 94 mg/dL (ref 70–99)
Potassium: 4.3 mmol/L (ref 3.5–5.1)
Sodium: 137 mmol/L (ref 135–145)
Total Bilirubin: 0.4 mg/dL (ref 0.3–1.2)
Total Protein: 7.3 g/dL (ref 6.5–8.1)

## 2020-04-14 LAB — TROPONIN I (HIGH SENSITIVITY): Troponin I (High Sensitivity): <2 ng/L (ref <18)

## 2020-04-14 LAB — PROTIME-INR
INR: 0.9 (ref 0.8–1.2)
Prothrombin Time: 11.9 seconds (ref 11.4–15.2)

## 2020-04-14 LAB — CBC WITH DIFFERENTIAL/PLATELET
Abs Immature Granulocytes: 0.03 10*3/uL (ref 0.00–0.07)
Basophils Absolute: 0.1 10*3/uL (ref 0.0–0.1)
Basophils Relative: 1 %
Eosinophils Absolute: 0.2 10*3/uL (ref 0.0–0.5)
Eosinophils Relative: 3 %
HCT: 43.3 % (ref 39.0–52.0)
Hemoglobin: 14.6 g/dL (ref 13.0–17.0)
Immature Granulocytes: 0 %
Lymphocytes Relative: 36 %
Lymphs Abs: 2.6 10*3/uL (ref 0.7–4.0)
MCH: 29.1 pg (ref 26.0–34.0)
MCHC: 33.7 g/dL (ref 30.0–36.0)
MCV: 86.3 fL (ref 80.0–100.0)
Monocytes Absolute: 0.5 10*3/uL (ref 0.1–1.0)
Monocytes Relative: 6 %
Neutro Abs: 4 10*3/uL (ref 1.7–7.7)
Neutrophils Relative %: 54 %
Platelets: 228 10*3/uL (ref 150–400)
RBC: 5.02 MIL/uL (ref 4.22–5.81)
RDW: 12.1 % (ref 11.5–15.5)
WBC: 7.3 10*3/uL (ref 4.0–10.5)
nRBC: 0 % (ref 0.0–0.2)

## 2020-04-14 LAB — POC SARS CORONAVIRUS 2 AG -  ED: SARS Coronavirus 2 Ag: NEGATIVE

## 2020-04-14 MED ORDER — IOHEXOL 350 MG/ML SOLN
75.0000 mL | Freq: Once | INTRAVENOUS | Status: AC | PRN
  Administered 2020-04-14: 75 mL via INTRAVENOUS
  Filled 2020-04-14: qty 75

## 2020-04-14 MED ORDER — ALBUTEROL SULFATE HFA 108 (90 BASE) MCG/ACT IN AERS: 2.0000 | INHALATION_SPRAY | Freq: Four times a day (QID) | RESPIRATORY_TRACT | 0 refills | Status: AC | PRN

## 2020-04-14 MED ORDER — BENZONATATE 100 MG PO CAPS: 100.0000 mg | ORAL_CAPSULE | Freq: Three times a day (TID) | ORAL | 0 refills | Status: DC | PRN

## 2020-04-14 NOTE — ED Triage Notes (Signed)
Pt called from [facility] to treatment room, no response 

## 2020-04-14 NOTE — ED Triage Notes (Addendum)
Pt arrived via POV with reports of + covid exposure, c/o cough, friend tested postiive today, but he tested negative with home test today. Pt reports some chest congestion as well.   No distress noted at this time.

## 2020-04-14 NOTE — ED Provider Notes (Signed)
[facility] Emergency Department Provider Note  ____________________________________________   Event Date/Time   First MD Initiated Contact with Patient 04/14/20 1505     (approximate)  I have reviewed the triage vital signs and the nursing notes.   HISTORY  Chief Complaint Cough  HPI Patrick Huffman is a 30 y.o. male who presents to the emergency department for evaluation of known positive Covid exposure, cough and chest congestion. The patient states that he has also had intermittent chest pain for which he has been recently evaluated multiple times for. He describes his chest pain as centralized and feeling as though something is "swollen". He states he has not had a clear diagnosis for this and has follow-up scheduled on January 13. He describes intermittent shortness of breath which does seem to make his chest pain worse at times, improved at other times. He has not taken anything recently for his symptoms.         Past Medical History:  Diagnosis Date  . Back pain   . Heart murmur   . Hernia, inguinal     There are no problems to display for this patient.   History reviewed. No pertinent surgical history.  Prior to Admission medications   Medication Sig Start Date End Date Taking? Authorizing Provider  benzonatate (TESSALON PERLES) 100 MG capsule Take 1 capsule (100 mg total) by mouth 3 (three) times daily as needed for cough. 04/14/20 04/14/21 Yes Donavan Kerlin, Farrel Gordon, PA  albuterol (VENTOLIN HFA) 108 (90 Base) MCG/ACT inhaler Inhale 2 puffs into the lungs every 6 (six) hours as needed for wheezing or shortness of breath. 04/14/20   Marlana Salvage, PA  alum & mag hydroxide-simeth (MAALOX MAX) 400-400-40 MG/5ML suspension Take 5 mLs by mouth every 6 (six) hours as needed for indigestion. 04/02/20   Rudene Re, MD  cyclobenzaprine (FLEXERIL) 5 MG tablet 1 tablet every 8 hours as needed for muscle spasms 03/05/20   Paulette Blanch, MD   cyclobenzaprine (FLEXERIL) 5 MG tablet 1 tablet every 8 hours as needed for muscle spasms 03/05/20   Paulette Blanch, MD  famotidine (PEPCID) 20 MG tablet Take 1 tablet (20 mg total) by mouth 2 (two) times daily for 7 days. 10/05/19 10/12/19  Menshew, Dannielle Karvonen, PA-C  naproxen (NAPROSYN) 500 MG tablet Take 1 tablet (500 mg total) by mouth 2 (two) times daily with a meal. 04/01/20   Paulette Blanch, MD  pantoprazole (PROTONIX) 40 MG tablet Take 1 tablet (40 mg total) by mouth daily. 04/02/20 04/02/21  Rudene Re, MD  prochlorperazine (COMPAZINE) 10 MG tablet Take 1 tablet (10 mg total) by mouth every 6 (six) hours as needed for nausea (headache). 04/01/20   Paulette Blanch, MD  cetirizine (ZYRTEC) 10 MG tablet Take 1 tablet (10 mg total) by mouth daily. Patient not taking: Reported on 11/26/2018 01/27/16 10/05/19  Cuthriell, Charline Bills, PA-C  dicyclomine (BENTYL) 20 MG tablet Take 1 tablet (20 mg total) by mouth 3 (three) times daily as needed for spasms. 04/12/19 10/05/19  Harvest Dark, MD  loratadine (CLARITIN) 10 MG tablet Take 1 tablet (10 mg total) by mouth daily for 14 days. 09/13/18 10/05/19  Duffy Bruce, MD  promethazine (PHENERGAN) 25 MG tablet Take 1 tablet (25 mg total) by mouth every 6 (six) hours as needed for nausea or vomiting. Patient not taking: Reported on 11/26/2018 05/07/16 10/05/19  Arlyss Repress, PA-C    Allergies No known allergies  History reviewed. No  pertinent family history.  Social History Social History   Tobacco Use  . Smoking status: Current Every Day Smoker    Packs/day: 0.50    Types: Cigarettes  . Smokeless tobacco: Never Used  Vaping Use  . Vaping Use: Never used  Substance Use Topics  . Alcohol use: Yes  . Drug use: No    Review of Systems Constitutional: No fever/chills Eyes: No visual changes. ENT: No sore throat. Cardiovascular: Intermittent chest pain. Respiratory: + Cough, intermittent shortness of breath. Gastrointestinal: No  abdominal pain.  No nausea, no vomiting.  No diarrhea.  No constipation. Genitourinary: Negative for dysuria. Musculoskeletal: Negative for back pain. Skin: Negative for rash. Neurological: Negative for headaches, focal weakness or numbness.  ____________________________________________   PHYSICAL EXAM:  VITAL SIGNS: ED Triage Vitals  Enc Vitals Group     BP 04/14/20 1405 121/76     Pulse Rate 04/14/20 1405 (!) 105     Resp 04/14/20 1405 18     Temp 04/14/20 1405 98.7 F (37.1 C)     Temp Source 04/14/20 1405 Oral     SpO2 04/14/20 1405 96 %     Weight 04/14/20 1406 200 lb (90.7 kg)     Height 04/14/20 1406 6' (1.829 m)     Head Circumference --      Peak Flow --      Pain Score 04/14/20 1406 5     Pain Loc --      Pain Edu? --      Excl. in GC? --    Constitutional: Alert and oriented. Well appearing and in no acute distress. Eyes: Conjunctivae are normal. PERRL. EOMI. Head: Atraumatic. Nose: No congestion/rhinnorhea. Mouth/Throat: Mucous membranes are moist.  Oropharynx non-erythematous. Neck: No stridor.   Cardiovascular: Normal rate, regular rhythm. Grossly normal heart sounds.  Good peripheral circulation. Respiratory: Normal respiratory effort.  No retractions. Lungs CTAB. Gastrointestinal: Soft and nontender. No distention. No abdominal bruits. No CVA tenderness. Musculoskeletal: No lower extremity tenderness nor edema.  No joint effusions. Neurologic:  Normal speech and language. No gross focal neurologic deficits are appreciated. No gait instability. Skin:  Skin is warm, dry and intact. No rash noted. Psychiatric: Mood and affect are normal. Speech and behavior are normal.  ____________________________________________   LABS (all labs ordered are listed, but only abnormal results are displayed)  Labs Reviewed  COMPREHENSIVE METABOLIC PANEL - Abnormal; Notable for the following components:      Result Value   ALT 51 (*)    All other components within  normal limits  SARS CORONAVIRUS 2 (TAT 6-24 HRS)  CBC WITH DIFFERENTIAL/PLATELET  PROTIME-INR  POC SARS CORONAVIRUS 2 AG -  ED  TROPONIN I (HIGH SENSITIVITY)   ____________________________________________  EKG  Normal sinus rhythm with sinus arrhythmia.  Rate of 60 bpm, QTC 366.  There are no ST elevations or depressions, no T wave flattening or inversions.  No evidence of acute ischemia. ____________________________________________  RADIOLOGY I, Lucy Chris, personally viewed and evaluated these images (plain radiographs) as part of my medical decision making, as well as reviewing the written report by the radiologist.  ED provider interpretation: No focal pneumonia  Official radiology report(s): CT Angio Chest PE W and/or Wo Contrast  Result Date: 04/14/2020 CLINICAL DATA:  30 year old male with history of COVID exposure. Cough. Evaluate for pulmonary embolism. EXAM: CT ANGIOGRAPHY CHEST WITH CONTRAST TECHNIQUE: Multidetector CT imaging of the chest was performed using the standard protocol during bolus administration of intravenous contrast. Multiplanar  CT image reconstructions and MIPs were obtained to evaluate the vascular anatomy. CONTRAST:  60mL OMNIPAQUE IOHEXOL 350 MG/ML SOLN COMPARISON:  Chest CTA 10/29/2019. FINDINGS: Cardiovascular: No filling defects within the pulmonary arterial tree to suggest pulmonary embolism. Heart size is normal. There is no significant pericardial fluid, thickening or pericardial calcification. No atherosclerotic calcifications in the thoracic aorta or the coronary arteries. Mediastinum/Nodes: No pathologically enlarged mediastinal or hilar lymph nodes. Esophagus is unremarkable in appearance. No axillary lymphadenopathy. Lungs/Pleura: No acute consolidative airspace disease. No pleural effusions. No suspicious appearing pulmonary nodules or masses are noted. Upper Abdomen: Unremarkable. Musculoskeletal: There are no aggressive appearing lytic or  blastic lesions noted in the visualized portions of the skeleton. Review of the MIP images confirms the above findings. IMPRESSION: 1. No evidence of pulmonary embolism. 2. No acute findings are noted in the thorax to account for the patient's symptoms. Electronically Signed   By: Trudie Reed M.D.   On: 04/14/2020 19:12   DG Chest Portable 1 View  Result Date: 04/14/2020 CLINICAL DATA:  Chest pain cough and congestion.  COVID exposure EXAM: PORTABLE CHEST 1 VIEW COMPARISON:  04/06/2020 FINDINGS: The heart size and mediastinal contours are within normal limits. Both lungs are clear. The visualized skeletal structures are unremarkable. IMPRESSION: No active disease. Electronically Signed   By: Marlan Palau M.D.   On: 04/14/2020 18:26    ____________________________________________   INITIAL IMPRESSION / ASSESSMENT AND PLAN / ED COURSE  As part of my medical decision making, I reviewed the following data within the electronic MEDICAL RECORD NUMBER Nursing notes reviewed and incorporated, Labs reviewed, EKG interpreted, Radiograph reviewed and Notes from prior ED visits        Patient is a 29 year old male who presents to the emergency department for evaluation of known Covid contact with associated symptoms of cough, shortness of breath and chest pain.  See HPI for further details.  On physical exam, the patient appears overall well, with no focal exam abnormalities identified.  In triage, the patient was mildly tachycardic at 105 with O2 sat 96%.  In comparison to recent visits for similar complaint of chest pain, the patient had not been tachycardic at that time, and had been satting near 99 to 100%.  In addition, the patient continues to complain of centralized chest pain, now associated with shortness of breath since the known Covid exposure.  Work-up in our emergency department included a Covid antigen test which was negative, unremarkable CBC, CMP, PT/INR and troponin less than 2.  A follow-up 6  to 24-hour Covid PCR test will be sent and the patient was instructed on how to receive these results.  EKG reveals sinus rhythm with sinus arrhythmia with no evidence of acute ischemia.  CT angio was obtained to evaluate for PE given the patient's chest pain, shortness of breath, tachycardia.  This was negative for any acute PE or other cause for the patient's chest pain.  Findings were discussed with the patient.  He is amenable with symptomatic treatment for his suspected Covid infection.  He states that he is out of his albuterol inhaler that he has for occasional use at home, and will refill this.  Also prescribed the patient Jerilynn Som for management of his cough in the interim.  Return precautions were discussed with the patient, and he will return for any acute worsening.  Patient stable at this time for outpatient therapy      ____________________________________________   FINAL CLINICAL IMPRESSION(S) / ED DIAGNOSES  Final diagnoses:  Suspected COVID-19 virus infection  Atypical chest pain     ED Discharge Orders         Ordered    albuterol (VENTOLIN HFA) 108 (90 Base) MCG/ACT inhaler  Every 6 hours PRN        04/14/20 1925    benzonatate (TESSALON PERLES) 100 MG capsule  3 times daily PRN        04/14/20 1925          *Please note:  TIANT PEIXOTO was evaluated in Emergency Department on 04/14/2020 for the symptoms described in the history of present illness. He was evaluated in the context of the global COVID-19 pandemic, which necessitated consideration that the patient might be at risk for infection with the SARS-CoV-2 virus that causes COVID-19. Institutional protocols and algorithms that pertain to the evaluation of patients at risk for COVID-19 are in a state of rapid change based on information released by regulatory bodies including the CDC and federal and state organizations. These policies and algorithms were followed during the patient's care in the ED.  Some ED  evaluations and interventions may be delayed as a result of limited staffing during and the pandemic.*   Note:  This document was prepared using Dragon voice recognition software and may include unintentional dictation errors.    Lucy Chris, PA 04/14/20 2315    Merwyn Katos, MD 04/14/20 [phone]

## 2020-04-14 NOTE — ED Notes (Signed)
Ptt presents to ED w/ c/o intermittent SOB and CP for a few weeks. Pt states he had a friend that tested positive for Covid. Pt A&O x 4 and appears to be in NAD at this time. VSS. Awaiting further orders. Will continue to monitor.

## 2020-04-15 LAB — SARS CORONAVIRUS 2 (TAT 6-24 HRS): SARS Coronavirus 2: NEGATIVE

## 2020-04-26 ENCOUNTER — Emergency Department
Admission: EM | Admit: 2020-04-26 | Discharge: 2020-04-26 | Disposition: A | Discharge status: Home / Self Care | Payer: Medicaid Other | Attending: Emergency Medicine | Admitting: Emergency Medicine

## 2020-04-26 ENCOUNTER — Other Ambulatory Visit: Payer: Self-pay

## 2020-04-26 DIAGNOSIS — F17210 Nicotine dependence, cigarettes, uncomplicated: Secondary | ICD-10-CM | POA: Insufficient documentation

## 2020-04-26 DIAGNOSIS — I10 Essential (primary) hypertension: Secondary | ICD-10-CM | POA: Insufficient documentation

## 2020-04-26 DIAGNOSIS — R42 Dizziness and giddiness: Secondary | ICD-10-CM

## 2020-04-26 LAB — BASIC METABOLIC PANEL
Anion gap: 9 (ref 5–15)
BUN: 14 mg/dL (ref 6–20)
CO2: 26 mmol/L (ref 22–32)
Calcium: 8.9 mg/dL (ref 8.9–10.3)
Chloride: 102 mmol/L (ref 98–111)
Creatinine, Ser: 1.01 mg/dL (ref 0.61–1.24)
GFR, Estimated: >60 mL/min (ref >60)
Glucose, Bld: 133 mg/dL — ABNORMAL HIGH (ref 70–99)
Potassium: 3.6 mmol/L (ref 3.5–5.1)
Sodium: 137 mmol/L (ref 135–145)

## 2020-04-26 LAB — CBC
HCT: 44.6 % (ref 39.0–52.0)
Hemoglobin: 15 g/dL (ref 13.0–17.0)
MCH: 29.4 pg (ref 26.0–34.0)
MCHC: 33.6 g/dL (ref 30.0–36.0)
MCV: 87.3 fL (ref 80.0–100.0)
Platelets: 240 10*3/uL (ref 150–400)
RBC: 5.11 MIL/uL (ref 4.22–5.81)
RDW: 12.1 % (ref 11.5–15.5)
WBC: 9 10*3/uL (ref 4.0–10.5)
nRBC: 0 % (ref 0.0–0.2)

## 2020-04-26 LAB — URINALYSIS, COMPLETE (UACMP) WITH MICROSCOPIC
Bacteria, UA: NONE SEEN
Bilirubin Urine: NEGATIVE
Glucose, UA: NEGATIVE mg/dL
Hgb urine dipstick: NEGATIVE
Ketones, ur: NEGATIVE mg/dL
Leukocytes,Ua: NEGATIVE
Nitrite: NEGATIVE
Protein, ur: 30 mg/dL — AB
Specific Gravity, Urine: 1.033 — ABNORMAL HIGH (ref 1.005–1.030)
Squamous Epithelial / HPF: NONE SEEN (ref 0–5)
pH: 6 (ref 5.0–8.0)

## 2020-04-26 NOTE — ED Notes (Signed)
Pt signed physical discharge form. Pt ambulated to lobby with friend.

## 2020-04-26 NOTE — ED Triage Notes (Signed)
PT to ED from home stating he was lying down and felt hot, so he checked his vital signs and his heart rate was in the 30's, BP was in the 70's systolic, and oxygen was in the 70's.

## 2020-04-26 NOTE — ED Provider Notes (Signed)
[facility] Emergency Department Provider Note   ____________________________________________    I have reviewed the triage vital signs and the nursing notes.   HISTORY  Chief Complaint Hypotension     HPI Patrick Huffman is a 30 y.o. male reports last night he was in bed and felt hot and somewhat sweaty so he checked his blood pressure and his heart rate and found them both to be low and he became concerned.  He used a wrist blood pressure cuff.  He denies chest pain.  Briefly felt dizzy.  Feels at baseline currently.  Has no complaints.  No fevers chills or cough.  No myalgias or fatigue  Past Medical History:  Diagnosis Date  . Back pain   . Heart murmur   . Hernia, inguinal     There are no problems to display for this patient.   History reviewed. No pertinent surgical history.  Prior to Admission medications   Medication Sig Start Date End Date Taking? Authorizing Provider  albuterol (VENTOLIN HFA) 108 (90 Base) MCG/ACT inhaler Inhale 2 puffs into the lungs every 6 (six) hours as needed for wheezing or shortness of breath. 04/14/20   Lucy Chris, PA  alum & mag hydroxide-simeth (MAALOX MAX) 400-400-40 MG/5ML suspension Take 5 mLs by mouth every 6 (six) hours as needed for indigestion. 04/02/20   Nita Sickle, MD  benzonatate (TESSALON PERLES) 100 MG capsule Take 1 capsule (100 mg total) by mouth 3 (three) times daily as needed for cough. 04/14/20 04/14/21  Lucy Chris, PA  cyclobenzaprine (FLEXERIL) 5 MG tablet 1 tablet every 8 hours as needed for muscle spasms 03/05/20   Irean Hong, MD  cyclobenzaprine (FLEXERIL) 5 MG tablet 1 tablet every 8 hours as needed for muscle spasms 03/05/20   Irean Hong, MD  famotidine (PEPCID) 20 MG tablet Take 1 tablet (20 mg total) by mouth 2 (two) times daily for 7 days. 10/05/19 10/12/19  Menshew, Charlesetta Ivory, PA-C  naproxen (NAPROSYN) 500 MG tablet Take 1 tablet (500 mg total) by mouth 2  (two) times daily with a meal. 04/01/20   Irean Hong, MD  pantoprazole (PROTONIX) 40 MG tablet Take 1 tablet (40 mg total) by mouth daily. 04/02/20 04/02/21  Nita Sickle, MD  prochlorperazine (COMPAZINE) 10 MG tablet Take 1 tablet (10 mg total) by mouth every 6 (six) hours as needed for nausea (headache). 04/01/20   Irean Hong, MD  cetirizine (ZYRTEC) 10 MG tablet Take 1 tablet (10 mg total) by mouth daily. Patient not taking: Reported on 11/26/2018 01/27/16 10/05/19  Cuthriell, Delorise Royals, PA-C  dicyclomine (BENTYL) 20 MG tablet Take 1 tablet (20 mg total) by mouth 3 (three) times daily as needed for spasms. 04/12/19 10/05/19  Minna Antis, MD  loratadine (CLARITIN) 10 MG tablet Take 1 tablet (10 mg total) by mouth daily for 14 days. 09/13/18 10/05/19  Shaune Pollack, MD  promethazine (PHENERGAN) 25 MG tablet Take 1 tablet (25 mg total) by mouth every 6 (six) hours as needed for nausea or vomiting. Patient not taking: Reported on 11/26/2018 05/07/16 10/05/19  Evangeline Dakin, PA-C     Allergies No known allergies  No family history on file.  Social History Social History   Tobacco Use  . Smoking status: Current Every Day Smoker    Packs/day: 0.50    Types: Cigarettes  . Smokeless tobacco: Never Used  Vaping Use  . Vaping Use: Never used  Substance Use  Topics  . Alcohol use: Not Currently  . Drug use: No    Review of Systems  Constitutional: As above Eyes: No visual changes.  ENT: No sore throat. Cardiovascular: Denies chest pain. Respiratory: Denies shortness of breath. Gastrointestinal: No abdominal pain.  No nausea, no vomiting.   Genitourinary: Negative for dysuria. Musculoskeletal: Negative for back pain. Skin: Negative for rash. Neurological: Negative for headaches or weakness   ____________________________________________   PHYSICAL EXAM:  VITAL SIGNS: ED Triage Vitals  Enc Vitals Group     BP 04/26/20 0838 126/87     Pulse Rate 04/26/20 0838 89      Resp 04/26/20 0838 18     Temp 04/26/20 0838 98.1 F (36.7 C)     Temp Source 04/26/20 0838 Oral     SpO2 04/26/20 0838 98 %     Weight 04/26/20 0839 90.7 kg (200 lb)     Height 04/26/20 0839 1.829 m (6')     Head Circumference --      Peak Flow --      Pain Score 04/26/20 0844 7     Pain Loc --      Pain Edu? --      Excl. in GC? --     Constitutional: Alert and oriented.   Nose: No congestion/rhinnorhea. Mouth/Throat: Mucous membranes are moist.    Cardiovascular: Normal rate, regular rhythm. Grossly normal heart sounds.  Good peripheral circulation. Respiratory: Normal respiratory effort.  No retractions. Lungs CTAB. Gastrointestinal: Soft and nontender. No distention.    Musculoskeletal: No lower extremity tenderness nor edema.  Warm and well perfused Neurologic:  Normal speech and language. No gross focal neurologic deficits are appreciated.  Skin:  Skin is warm, dry and intact. No rash noted. Psychiatric: Mood and affect are normal. Speech and behavior are normal.  ____________________________________________   LABS (all labs ordered are listed, but only abnormal results are displayed)  Labs Reviewed  BASIC METABOLIC PANEL - Abnormal; Notable for the following components:      Result Value   Glucose, Bld 133 (*)    All other components within normal limits  URINALYSIS, COMPLETE (UACMP) WITH MICROSCOPIC - Abnormal; Notable for the following components:   Color, Urine YELLOW (*)    APPearance HAZY (*)    Specific Gravity, Urine 1.033 (*)    Protein, ur 30 (*)    All other components within normal limits  CBC  CBG MONITORING, ED   ____________________________________________  EKG  ED ECG REPORT I, Jene Every, the attending physician, personally viewed and interpreted this ECG.  Date: 04/26/2020  Rhythm: normal sinus rhythm QRS Axis: normal Intervals: normal ST/T Wave abnormalities: normal Narrative Interpretation: no evidence of acute  ischemia  ____________________________________________  RADIOLOGY  None ____________________________________________   PROCEDURES  Procedure(s) performed: No  Procedures   Critical Care performed: No ____________________________________________   INITIAL IMPRESSION / ASSESSMENT AND PLAN / ED COURSE  Pertinent labs & imaging results that were available during my care of the patient were reviewed by me and considered in my medical decision making (see chart for details).  Patient well-appearing and in no acute distress, vital signs are normal here.  Normal blood pressure, normal heart rate.  EKG is normal.  Lab tests are unremarkable.  I suspect measurement error, patient reassured, outpatient follow-up as needed.  Return precautions discussed    ____________________________________________   FINAL CLINICAL IMPRESSION(S) / ED DIAGNOSES  Final diagnoses:  Dizziness        Note:  This  document was prepared using Conservation officer, historic buildings and may include unintentional dictation errors.   Jene Every, MD 04/26/20 1525

## 2020-05-14 ENCOUNTER — Emergency Department
Admission: EM | Admit: 2020-05-14 | Discharge: 2020-05-14 | Disposition: A | Discharge status: Home / Self Care | Payer: Medicaid Other | Attending: Emergency Medicine | Admitting: Emergency Medicine

## 2020-05-14 ENCOUNTER — Encounter: Payer: Self-pay | Admitting: Emergency Medicine

## 2020-05-14 ENCOUNTER — Other Ambulatory Visit: Payer: Self-pay

## 2020-05-14 DIAGNOSIS — F17210 Nicotine dependence, cigarettes, uncomplicated: Secondary | ICD-10-CM | POA: Insufficient documentation

## 2020-05-14 DIAGNOSIS — L210 Seborrhea capitis: Secondary | ICD-10-CM | POA: Insufficient documentation

## 2020-05-14 DIAGNOSIS — R238 Other skin changes: Secondary | ICD-10-CM

## 2020-05-14 NOTE — ED Triage Notes (Signed)
C/O headache x 30 minutes.  Also c/o blurred vision and pins and needles to legs bilaterally.  Patient is AAOx3. Skin warm and dry.MAE equally and strong.  Speech clear. Facial movements equal.  NAD

## 2020-05-14 NOTE — Discharge Instructions (Addendum)
Please take ibuprofen or tylenol as needed, follow up with your PCP as needed

## 2020-05-14 NOTE — ED Provider Notes (Signed)
[facility] Emergency Department Provider Note   ____________________________________________    I have reviewed the triage vital signs and the nursing notes.   HISTORY  Chief Complaint Headache     HPI Patrick Huffman is a 30 y.o. male who presents because of a area of tenderness on the back of his scalp which he noticed this morning while lying on his pillow.  He reports he has a burning there.  Denies injury to the area.  Denies neuro deficits today.  No other injuries reported  Past Medical History:  Diagnosis Date  . Back pain   . Heart murmur   . Hernia, inguinal     There are no problems to display for this patient.   History reviewed. No pertinent surgical history.  Prior to Admission medications   Medication Sig Start Date End Date Taking? Authorizing Provider  albuterol (VENTOLIN HFA) 108 (90 Base) MCG/ACT inhaler Inhale 2 puffs into the lungs every 6 (six) hours as needed for wheezing or shortness of breath. 04/14/20   Lucy Chris, PA  alum & mag hydroxide-simeth (MAALOX MAX) 400-400-40 MG/5ML suspension Take 5 mLs by mouth every 6 (six) hours as needed for indigestion. 04/02/20   Nita Sickle, MD  benzonatate (TESSALON PERLES) 100 MG capsule Take 1 capsule (100 mg total) by mouth 3 (three) times daily as needed for cough. 04/14/20 04/14/21  Lucy Chris, PA  cyclobenzaprine (FLEXERIL) 5 MG tablet 1 tablet every 8 hours as needed for muscle spasms 03/05/20   Irean Hong, MD  cyclobenzaprine (FLEXERIL) 5 MG tablet 1 tablet every 8 hours as needed for muscle spasms 03/05/20   Irean Hong, MD  famotidine (PEPCID) 20 MG tablet Take 1 tablet (20 mg total) by mouth 2 (two) times daily for 7 days. 10/05/19 10/12/19  Menshew, Charlesetta Ivory, PA-C  naproxen (NAPROSYN) 500 MG tablet Take 1 tablet (500 mg total) by mouth 2 (two) times daily with a meal. 04/01/20   Irean Hong, MD  pantoprazole (PROTONIX) 40 MG tablet Take 1 tablet  (40 mg total) by mouth daily. 04/02/20 04/02/21  Nita Sickle, MD  prochlorperazine (COMPAZINE) 10 MG tablet Take 1 tablet (10 mg total) by mouth every 6 (six) hours as needed for nausea (headache). 04/01/20   Irean Hong, MD  cetirizine (ZYRTEC) 10 MG tablet Take 1 tablet (10 mg total) by mouth daily. Patient not taking: Reported on 11/26/2018 01/27/16 10/05/19  Cuthriell, Delorise Royals, PA-C  dicyclomine (BENTYL) 20 MG tablet Take 1 tablet (20 mg total) by mouth 3 (three) times daily as needed for spasms. 04/12/19 10/05/19  Minna Antis, MD  loratadine (CLARITIN) 10 MG tablet Take 1 tablet (10 mg total) by mouth daily for 14 days. 09/13/18 10/05/19  Shaune Pollack, MD  promethazine (PHENERGAN) 25 MG tablet Take 1 tablet (25 mg total) by mouth every 6 (six) hours as needed for nausea or vomiting. Patient not taking: Reported on 11/26/2018 05/07/16 10/05/19  Evangeline Dakin, PA-C     Allergies No known allergies  No family history on file.  Social History Social History   Tobacco Use  . Smoking status: Current Every Day Smoker    Packs/day: 0.50    Types: Cigarettes  . Smokeless tobacco: Never Used  Vaping Use  . Vaping Use: Never used  Substance Use Topics  . Alcohol use: Not Currently  . Drug use: No    Review of Systems  Constitutional: No fever/chills  Musculoskeletal: Negative for back pain. Skin: No reports of rash Neurological: As above    ____________________________________________   PHYSICAL EXAM:  VITAL SIGNS: ED Triage Vitals  Enc Vitals Group     BP 05/14/20 0721 121/79     Pulse Rate 05/14/20 0721 84     Resp 05/14/20 0721 16     Temp 05/14/20 0721 97.8 F (36.6 C)     Temp Source 05/14/20 0721 Oral     SpO2 05/14/20 0721 98 %     Weight 05/14/20 0719 90.7 kg (200 lb)     Height 05/14/20 0719 1.829 m (6')     Head Circumference --      Peak Flow --      Pain Score 05/14/20 0718 8     Pain Loc --      Pain Edu? --      Excl. in GC?  --      Constitutional: Alert and oriented. No acute distress. Eyes: Conjunctivae are normal.  Head: Atraumatic.  Scalp exam is normal, no abscess or rash Nose: No congestion/rhinnorhea. Mouth/Throat: Mucous membranes are moist.   Cardiovascular: Normal rate, regular rhythm.   Musculoskeletal: No lower extremity tenderness nor edema.   Neurologic:  Normal speech and language. No gross focal neurologic deficits are appreciated.   Skin:  Skin is warm, dry and intact. No rash noted.   ____________________________________________   LABS (all labs ordered are listed, but only abnormal results are displayed)  Labs Reviewed - No data to display ____________________________________________  EKG   ____________________________________________  RADIOLOGY  None ____________________________________________   PROCEDURES  Procedure(s) performed: No  Procedures   Critical Care performed: No ____________________________________________   INITIAL IMPRESSION / ASSESSMENT AND PLAN / ED COURSE  Pertinent labs & imaging results that were available during my care of the patient were reviewed by me and considered in my medical decision making (see chart for details).  Patient's exam is unremarkable, vital signs are reassuring, no indication for emergent imaging at this time, outpatient follow-up as needed.   ____________________________________________   FINAL CLINICAL IMPRESSION(S) / ED DIAGNOSES  Final diagnoses:  Scalp irritation      NEW MEDICATIONS STARTED DURING THIS VISIT:  Discharge Medication List as of 05/14/2020  8:24 AM       Note:  This document was prepared using Dragon voice recognition software and may include unintentional dictation errors.   Jene Every, MD 05/14/20 1227

## 2020-05-19 ENCOUNTER — Encounter: Payer: Self-pay | Admitting: Emergency Medicine

## 2020-05-19 ENCOUNTER — Emergency Department
Admission: EM | Admit: 2020-05-19 | Discharge: 2020-05-19 | Disposition: A | Discharge status: Home / Self Care | Payer: Medicaid Other | Attending: Emergency Medicine | Admitting: Emergency Medicine

## 2020-05-19 ENCOUNTER — Emergency Department: Payer: Medicaid Other

## 2020-05-19 DIAGNOSIS — F17210 Nicotine dependence, cigarettes, uncomplicated: Secondary | ICD-10-CM | POA: Insufficient documentation

## 2020-05-19 DIAGNOSIS — R0789 Other chest pain: Secondary | ICD-10-CM | POA: Insufficient documentation

## 2020-05-19 DIAGNOSIS — R1011 Right upper quadrant pain: Secondary | ICD-10-CM | POA: Insufficient documentation

## 2020-05-19 LAB — URINALYSIS, COMPLETE (UACMP) WITH MICROSCOPIC
Bacteria, UA: NONE SEEN
Bilirubin Urine: NEGATIVE
Glucose, UA: NEGATIVE mg/dL
Hgb urine dipstick: NEGATIVE
Ketones, ur: NEGATIVE mg/dL
Leukocytes,Ua: NEGATIVE
Nitrite: NEGATIVE
Protein, ur: NEGATIVE mg/dL
Specific Gravity, Urine: >1.03 — ABNORMAL HIGH (ref 1.005–1.030)
Squamous Epithelial / HPF: NONE SEEN (ref 0–5)
pH: 6 (ref 5.0–8.0)

## 2020-05-19 LAB — COMPREHENSIVE METABOLIC PANEL
ALT: 57 U/L — ABNORMAL HIGH (ref 0–44)
AST: 26 U/L (ref 15–41)
Albumin: 4.3 g/dL (ref 3.5–5.0)
Alkaline Phosphatase: 61 U/L (ref 38–126)
Anion gap: 10 (ref 5–15)
BUN: 16 mg/dL (ref 6–20)
CO2: 25 mmol/L (ref 22–32)
Calcium: 9 mg/dL (ref 8.9–10.3)
Chloride: 102 mmol/L (ref 98–111)
Creatinine, Ser: 0.97 mg/dL (ref 0.61–1.24)
GFR, Estimated: >60 mL/min (ref >60)
Glucose, Bld: 103 mg/dL — ABNORMAL HIGH (ref 70–99)
Potassium: 3.8 mmol/L (ref 3.5–5.1)
Sodium: 137 mmol/L (ref 135–145)
Total Bilirubin: 0.5 mg/dL (ref 0.3–1.2)
Total Protein: 7.6 g/dL (ref 6.5–8.1)

## 2020-05-19 LAB — FIBRIN DERIVATIVES D-DIMER (ARMC ONLY): Fibrin derivatives D-dimer (ARMC): 199.09 ng/mL (FEU) (ref 0.00–499.00)

## 2020-05-19 LAB — CBC
HCT: 43.7 % (ref 39.0–52.0)
Hemoglobin: 14.3 g/dL (ref 13.0–17.0)
MCH: 28.9 pg (ref 26.0–34.0)
MCHC: 32.7 g/dL (ref 30.0–36.0)
MCV: 88.5 fL (ref 80.0–100.0)
Platelets: 232 10*3/uL (ref 150–400)
RBC: 4.94 MIL/uL (ref 4.22–5.81)
RDW: 12.3 % (ref 11.5–15.5)
WBC: 11.2 10*3/uL — ABNORMAL HIGH (ref 4.0–10.5)
nRBC: 0 % (ref 0.0–0.2)

## 2020-05-19 LAB — LIPASE, BLOOD: Lipase: 36 U/L (ref 11–51)

## 2020-05-19 LAB — TROPONIN I (HIGH SENSITIVITY): Troponin I (High Sensitivity): 3 ng/L (ref <18)

## 2020-05-19 MED ORDER — LIDOCAINE VISCOUS HCL 2 % MT SOLN
15.0000 mL | Freq: Once | OROMUCOSAL | Status: AC
  Administered 2020-05-19: 15 mL via ORAL
  Filled 2020-05-19: qty 15

## 2020-05-19 MED ORDER — PANTOPRAZOLE SODIUM 40 MG PO TBEC
40.0000 mg | DELAYED_RELEASE_TABLET | Freq: Once | ORAL | Status: AC
  Administered 2020-05-19: 40 mg via ORAL
  Filled 2020-05-19: qty 1

## 2020-05-19 MED ORDER — ALUM & MAG HYDROXIDE-SIMETH 200-200-20 MG/5ML PO SUSP
30.0000 mL | Freq: Once | ORAL | Status: AC
  Administered 2020-05-19: 30 mL via ORAL
  Filled 2020-05-19: qty 30

## 2020-05-19 NOTE — ED Notes (Signed)
Printed out [employer] and pt signed on paper d/t being in the hallway and no available computer to sign on e-pad.

## 2020-05-19 NOTE — ED Provider Notes (Signed)
[facility] Emergency Department Provider Note  ____________________________________________   Event Date/Time   First MD Initiated Contact with Patient 05/19/20 0505     (approximate)  I have reviewed the triage vital signs and the nursing notes.   HISTORY  Chief Complaint Abdominal Pain and Chest Pain    HPI Patrick Huffman is a 30 y.o. male with no significant past medical history who presents to the emergency department with right upper quadrant abdominal pain, right lower lateral chest pain for the past 2 days. States pain is worse with palpation, deep inspiration and after eating. No known history of gallstones. No previous abdominal surgeries. No known fevers, chills, nausea, vomiting, diarrhea. No shortness of breath. No cough. States he feels that his abdomen become swollen after eating. No injury to the chest wall. States when he takes a deep breath he feels "popping".  No history of PE, DVT, exogenous estrogen use, recent fractures, surgery, trauma, hospitalization, prolonged travel or other immobilization. No lower extremity swelling or pain. No calf tenderness.  Patient appears to visit the emergency department frequently with 15 visits in the past 6 months. He does not have a local PCP.        Past Medical History:  Diagnosis Date  . Back pain   . Heart murmur   . Hernia, inguinal     There are no problems to display for this patient.   History reviewed. No pertinent surgical history.  Prior to Admission medications   Medication Sig Start Date End Date Taking? Authorizing Provider  albuterol (VENTOLIN HFA) 108 (90 Base) MCG/ACT inhaler Inhale 2 puffs into the lungs every 6 (six) hours as needed for wheezing or shortness of breath. 04/14/20   Lucy Chris, PA  alum & mag hydroxide-simeth (MAALOX MAX) 400-400-40 MG/5ML suspension Take 5 mLs by mouth every 6 (six) hours as needed for indigestion. 04/02/20   Nita Sickle, MD   benzonatate (TESSALON PERLES) 100 MG capsule Take 1 capsule (100 mg total) by mouth 3 (three) times daily as needed for cough. 04/14/20 04/14/21  Lucy Chris, PA  cyclobenzaprine (FLEXERIL) 5 MG tablet 1 tablet every 8 hours as needed for muscle spasms 03/05/20   Irean Hong, MD  cyclobenzaprine (FLEXERIL) 5 MG tablet 1 tablet every 8 hours as needed for muscle spasms 03/05/20   Irean Hong, MD  famotidine (PEPCID) 20 MG tablet Take 1 tablet (20 mg total) by mouth 2 (two) times daily for 7 days. 10/05/19 10/12/19  Menshew, Charlesetta Ivory, PA-C  naproxen (NAPROSYN) 500 MG tablet Take 1 tablet (500 mg total) by mouth 2 (two) times daily with a meal. 04/01/20   Irean Hong, MD  pantoprazole (PROTONIX) 40 MG tablet Take 1 tablet (40 mg total) by mouth daily. 04/02/20 04/02/21  Nita Sickle, MD  prochlorperazine (COMPAZINE) 10 MG tablet Take 1 tablet (10 mg total) by mouth every 6 (six) hours as needed for nausea (headache). 04/01/20   Irean Hong, MD  cetirizine (ZYRTEC) 10 MG tablet Take 1 tablet (10 mg total) by mouth daily. Patient not taking: Reported on 11/26/2018 01/27/16 10/05/19  Cuthriell, Delorise Royals, PA-C  dicyclomine (BENTYL) 20 MG tablet Take 1 tablet (20 mg total) by mouth 3 (three) times daily as needed for spasms. 04/12/19 10/05/19  Minna Antis, MD  loratadine (CLARITIN) 10 MG tablet Take 1 tablet (10 mg total) by mouth daily for 14 days. 09/13/18 10/05/19  Shaune Pollack, MD  promethazine (PHENERGAN) 25  MG tablet Take 1 tablet (25 mg total) by mouth every 6 (six) hours as needed for nausea or vomiting. Patient not taking: Reported on 11/26/2018 05/07/16 10/05/19  Evangeline Dakin, PA-C    Allergies No known allergies  History reviewed. No pertinent family history.  Social History Social History   Tobacco Use  . Smoking status: Current Every Day Smoker    Packs/day: 0.50    Types: Cigarettes  . Smokeless tobacco: Never Used  Vaping Use  . Vaping Use: Never used   Substance Use Topics  . Alcohol use: Not Currently  . Drug use: No    Review of Systems Constitutional: No fever. Eyes: No visual changes. ENT: No sore throat. Cardiovascular: Right lower chest pain. Respiratory: Denies shortness of breath. Gastrointestinal: No nausea, vomiting, diarrhea. Genitourinary: Negative for dysuria. Musculoskeletal: Negative for back pain. Skin: Negative for rash. Neurological: Negative for focal weakness or numbness.  ____________________________________________   PHYSICAL EXAM:  VITAL SIGNS: ED Triage Vitals [05/19/20 0437]  Enc Vitals Group     BP 136/80     Pulse Rate 79     Resp 18     Temp 99 F (37.2 C)     Temp Source Oral     SpO2 98 %     Weight      Height      Head Circumference      Peak Flow      Pain Score      Pain Loc      Pain Edu?      Excl. in GC?    CONSTITUTIONAL: Alert and oriented and responds appropriately to questions. Well-appearing; well-nourished HEAD: Normocephalic EYES: Conjunctivae clear, pupils appear equal, EOM appear intact ENT: normal nose; moist mucous membranes NECK: Supple, normal ROM CARD: RRR; S1 and S2 appreciated; no murmurs, no clicks, no rubs, no gallops RESP: Normal chest excursion without splinting or tachypnea; breath sounds clear and equal bilaterally; no wheezes, no rhonchi, no rales, no hypoxia or respiratory distress, speaking full sentences ABD/GI: Normal bowel sounds; non-distended; soft, tender to palpation of the right upper quadrant with positive Murphy sign, no rebound, no guarding, no peritoneal signs, no hepatosplenomegaly, no tenderness at McBurney's point BACK: The back appears normal EXT: Normal ROM in all joints; no deformity noted, no edema; no cyanosis SKIN: Normal color for age and race; warm; no rash on exposed skin NEURO: Moves all extremities equally PSYCH: The patient's mood and manner are appropriate.  ____________________________________________   LABS (all  labs ordered are listed, but only abnormal results are displayed)  Labs Reviewed  COMPREHENSIVE METABOLIC PANEL - Abnormal; Notable for the following components:      Result Value   Glucose, Bld 103 (*)    ALT 57 (*)    All other components within normal limits  CBC - Abnormal; Notable for the following components:   WBC 11.2 (*)    All other components within normal limits  URINALYSIS, COMPLETE (UACMP) WITH MICROSCOPIC - Abnormal; Notable for the following components:   Specific Gravity, Urine >1.030 (*)    All other components within normal limits  LIPASE, BLOOD  FIBRIN DERIVATIVES D-DIMER [facility] ONLY)  TROPONIN I (HIGH SENSITIVITY)   ____________________________________________  EKG   EKG Interpretation  Date/Time:  Sunday May 19 2020 04:37:46 EST Ventricular Rate:  70 PR Interval:  114 QRS Duration: 86 QT Interval:  358 QTC Calculation: 386 R Axis:   59 Text Interpretation: Normal sinus rhythm Possible Inferior infarct , age  undetermined Abnormal ECG No change from EKG in December 2020 Confirmed by Rochele Raring [phone]) on 05/19/2020 5:07:46 AM       ____________________________________________  RADIOLOGY I, Faithlynn Deeley, personally viewed and evaluated these images (plain radiographs) as part of my medical decision making, as well as reviewing the written report by the radiologist.  ED MD interpretation: Chest x-ray and right upper quadrant ultrasound unremarkable.  Official radiology report(s): DG Chest 2 View  Result Date: 05/19/2020 CLINICAL DATA:  30 year old male with right lower rib pain and abdominal swelling for 2 days. No known injury. Smoker. EXAM: CHEST - 2 VIEW COMPARISON:  CTA chest 04/14/2020 and earlier. FINDINGS: Lung volumes and mediastinal contours are stable and within normal limits. Visualized tracheal air column is within normal limits. Mild bilateral increased pulmonary interstitial markings appear stable since December, mostly perihilar. No  pneumothorax, pulmonary edema, pleural effusion or confluent pulmonary opacity. No acute osseous abnormality identified. Negative visible bowel gas pattern. IMPRESSION: 1. No acute cardiopulmonary abnormality. 2. Stable mild pulmonary interstitial changes, probably smoking related. Electronically Signed   By: Odessa Fleming M.D.   On: 05/19/2020 05:26   US Abdomen Limited RUQ (LIVER/GB)  Result Date: 05/19/2020 CLINICAL DATA:  Right-sided abdominal pain for 2 days. EXAM: ULTRASOUND ABDOMEN LIMITED RIGHT UPPER QUADRANT COMPARISON:  CT stone study 03/05/2020 FINDINGS: Gallbladder: No gallstones or wall thickening visualized. No sonographic Murphy sign noted by sonographer. Layering sludge is present. Common bile duct: Diameter: 3.2 mm, within normal limits. Liver: Liver is diffusely echogenic. No discrete lesions are present. There is loss of normal internal architecture. Portal vein is patent on color Doppler imaging with normal direction of blood flow towards the liver. Other: None. IMPRESSION: 1. No acute abnormality. 2. Question sludge in the gallbladder. 3. Diffuse fatty infiltration of the liver, stable. No discrete lesions. Electronically Signed   By: Marin Roberts M.D.   On: 05/19/2020 05:57    ____________________________________________   PROCEDURES  Procedure(s) performed (including Critical Care):  None ____________________________________________   INITIAL IMPRESSION / ASSESSMENT AND PLAN / ED COURSE  As part of my medical decision making, I reviewed the following data within the electronic MEDICAL RECORD NUMBER Nursing notes reviewed and incorporated, Labs reviewed, EKG interpreted NSR, Radiograph reviewed, Notes from prior ED visits and [place] Controlled Substance Database         Patient here with right lower chest pain, right upper abdominal pain. He does have a positive Murphy sign on my exam. Will obtain right upper quadrant ultrasound to evaluate for cholelithiasis, cholecystitis,  choledocholithiasis. Differential also includes gastritis, pancreatitis, cholangitis. Doubt appendicitis. He is also complaining of right lower chest pain. This seems very atypical. Doubt ACS. EKG does show some T wave inversions in inferior leads but similar to EKG in December 2020. He is PERC negative. Doubt PE but given his pleuritic chest pain, will obtain D-dimer. Doubt dissection. Labs, urine, chest x-ray pending. Will give GI cocktail, Protonix and reassess.     6:54 AM  Pt continues to appear very comfortable. Labs here are unrevealing. Normal LFTs, lipase. Negative troponin. Negative D-dimer. Chest x-ray shows no acute pathology. Right upper quad ultrasound shows questionable sludge in the gallbladder but no stones, ductal dilatation or cholecystitis. He states now the pain is worse whenever he is stretching, moving. I told him that this is likely musculoskeletal pain that he can alternate Tylenol Motrin. Have encouraged him to establish care with a primary care doctor given his frequent visits to the ED. Discussed return  precautions. He verbalized understanding.   At this time, I do not feel there is any life-threatening condition present. I have reviewed, interpreted and discussed all results (EKG, imaging, lab, urine as appropriate) and exam findings with patient/family. I have reviewed nursing notes and appropriate previous records.  I feel the patient is safe to be discharged home without further emergent workup and can continue workup as an outpatient as needed. Discussed usual and customary return precautions. Patient/family verbalize understanding and are comfortable with this plan.  Outpatient follow-up has been provided as needed. All questions have been answered.  ____________________________________________   FINAL CLINICAL IMPRESSION(S) / ED DIAGNOSES  Final diagnoses:  RUQ abdominal pain     ED Discharge Orders    None      *Please note:  HOOPER GRIESEL was evaluated  in Emergency Department on 05/19/2020 for the symptoms described in the history of present illness. He was evaluated in the context of the global COVID-19 pandemic, which necessitated consideration that the patient might be at risk for infection with the SARS-CoV-2 virus that causes COVID-19. Institutional protocols and algorithms that pertain to the evaluation of patients at risk for COVID-19 are in a state of rapid change based on information released by regulatory bodies including the CDC and federal and state organizations. These policies and algorithms were followed during the patient's care in the ED.  Some ED evaluations and interventions may be delayed as a result of limited staffing during and the pandemic.*   Note:  This document was prepared using Dragon voice recognition software and may include unintentional dictation errors.   Koty Anctil, Layla Maw, DO 05/19/20 [phone]

## 2020-05-19 NOTE — ED Triage Notes (Signed)
Pt c/o right lower rib pain and abdominal swelling x2 days. Pt denies injury. Pt denies SOB.

## 2020-05-19 NOTE — ED Notes (Signed)
Pt states "every time I breathe in it huts my ribs and lungs are" - upon clarification pt reports localized to R side -

## 2020-05-19 NOTE — Discharge Instructions (Addendum)
Steps to find a Primary Care Provider (PCP):  Call [phone] or [phone] to access "[place] Find a Doctor Service."  2.  You may also go on the [facility] website at [email or web address]   You may alternate Tylenol 1000 mg every 6 hours as needed for pain, fever and Ibuprofen 800 mg every 8 hours as needed for pain, fever.  Please take Ibuprofen with food.  Do not take more than 4000 mg of Tylenol (acetaminophen) in a 24 hour period.   Your work-up today was reassuring including normal labs, chest x-ray, abdominal ultrasound, EKG. I recommend close follow-up with a primary care physician. I suspect that your pain today is musculoskeletal in nature, likely a pulled muscle.

## 2020-07-08 ENCOUNTER — Other Ambulatory Visit: Payer: Self-pay

## 2020-07-08 ENCOUNTER — Encounter: Payer: Self-pay | Admitting: Emergency Medicine

## 2020-07-08 DIAGNOSIS — R42 Dizziness and giddiness: Secondary | ICD-10-CM | POA: Insufficient documentation

## 2020-07-08 DIAGNOSIS — R519 Headache, unspecified: Secondary | ICD-10-CM | POA: Insufficient documentation

## 2020-07-08 DIAGNOSIS — Z5321 Procedure and treatment not carried out due to patient leaving prior to being seen by health care provider: Secondary | ICD-10-CM | POA: Insufficient documentation

## 2020-07-08 DIAGNOSIS — R55 Syncope and collapse: Secondary | ICD-10-CM | POA: Insufficient documentation

## 2020-07-08 LAB — CBC WITH DIFFERENTIAL/PLATELET
Abs Immature Granulocytes: 0.02 10*3/uL (ref 0.00–0.07)
Basophils Absolute: 0.1 10*3/uL (ref 0.0–0.1)
Basophils Relative: 1 %
Eosinophils Absolute: 0.2 10*3/uL (ref 0.0–0.5)
Eosinophils Relative: 3 %
HCT: 46.3 % (ref 39.0–52.0)
Hemoglobin: 15.6 g/dL (ref 13.0–17.0)
Immature Granulocytes: 0 %
Lymphocytes Relative: 38 %
Lymphs Abs: 2.8 10*3/uL (ref 0.7–4.0)
MCH: 29 pg (ref 26.0–34.0)
MCHC: 33.7 g/dL (ref 30.0–36.0)
MCV: 86.1 fL (ref 80.0–100.0)
Monocytes Absolute: 0.4 10*3/uL (ref 0.1–1.0)
Monocytes Relative: 6 %
Neutro Abs: 3.8 10*3/uL (ref 1.7–7.7)
Neutrophils Relative %: 52 %
Platelets: 240 10*3/uL (ref 150–400)
RBC: 5.38 MIL/uL (ref 4.22–5.81)
RDW: 12.4 % (ref 11.5–15.5)
WBC: 7.2 10*3/uL (ref 4.0–10.5)
nRBC: 0 % (ref 0.0–0.2)

## 2020-07-08 NOTE — ED Triage Notes (Addendum)
Patient ambulatory to triage with steady gait, without difficulty or distress noted; pt reports onset pain to left side FA that radiates over to left temple with tenderness to temple accomp by dizziness; also st syncopal episode PTA

## 2020-07-09 ENCOUNTER — Emergency Department: Payer: Medicaid Other

## 2020-07-09 ENCOUNTER — Emergency Department
Admission: EM | Admit: 2020-07-09 | Discharge: 2020-07-09 | Disposition: A | Discharge status: Home / Self Care | Payer: Medicaid Other | Attending: Emergency Medicine | Admitting: Emergency Medicine

## 2020-07-09 LAB — COMPREHENSIVE METABOLIC PANEL
ALT: 79 U/L — ABNORMAL HIGH (ref 0–44)
AST: 37 U/L (ref 15–41)
Albumin: 4.4 g/dL (ref 3.5–5.0)
Alkaline Phosphatase: 72 U/L (ref 38–126)
Anion gap: 7 (ref 5–15)
BUN: 13 mg/dL (ref 6–20)
CO2: 23 mmol/L (ref 22–32)
Calcium: 9.1 mg/dL (ref 8.9–10.3)
Chloride: 106 mmol/L (ref 98–111)
Creatinine, Ser: 0.94 mg/dL (ref 0.61–1.24)
GFR, Estimated: >60 mL/min (ref >60)
Glucose, Bld: 130 mg/dL — ABNORMAL HIGH (ref 70–99)
Potassium: 3.6 mmol/L (ref 3.5–5.1)
Sodium: 136 mmol/L (ref 135–145)
Total Bilirubin: 0.8 mg/dL (ref 0.3–1.2)
Total Protein: 7.6 g/dL (ref 6.5–8.1)

## 2020-07-09 LAB — TROPONIN I (HIGH SENSITIVITY)
Troponin I (High Sensitivity): 2 ng/L (ref <18)
Troponin I (High Sensitivity): 3 ng/L (ref <18)

## 2020-08-02 ENCOUNTER — Other Ambulatory Visit: Payer: Self-pay

## 2020-08-02 ENCOUNTER — Emergency Department
Admission: EM | Admit: 2020-08-02 | Discharge: 2020-08-02 | Disposition: A | Discharge status: Home / Self Care | Payer: Medicaid Other | Attending: Emergency Medicine | Admitting: Emergency Medicine

## 2020-08-02 DIAGNOSIS — F17210 Nicotine dependence, cigarettes, uncomplicated: Secondary | ICD-10-CM | POA: Insufficient documentation

## 2020-08-02 DIAGNOSIS — R0789 Other chest pain: Secondary | ICD-10-CM | POA: Insufficient documentation

## 2020-08-02 NOTE — ED Triage Notes (Signed)
Pt BIB POV, endorses sudden onset chest pain starting while attempting to go to sleep. 7/10 at this time, endorses numbness/tingling to left arm. Denies medical history, states father had MI.

## 2020-08-02 NOTE — ED Provider Notes (Signed)
[facility] Emergency Department Provider Note   ____________________________________________    I have reviewed the triage vital signs and the nursing notes.   HISTORY  Chief Complaint Chest Pain     HPI Patrick Huffman is a 30 y.o. male who presents after an episode of chest pain which lasted a few seconds while he was watching TV.  Patient notes it was a sharp pain in the center of his chest.  He reports he has had this many times in the past.  Currently feels well and has no complaints.  No shortness of breath.  No pleurisy.  No calf pain or swelling.  No nausea vomiting or diaphoresis  Past Medical History:  Diagnosis Date  . Back pain   . Heart murmur   . Hernia, inguinal     There are no problems to display for this patient.   History reviewed. No pertinent surgical history.  Prior to Admission medications   Medication Sig Start Date End Date Taking? Authorizing Provider  albuterol (VENTOLIN HFA) 108 (90 Base) MCG/ACT inhaler Inhale 2 puffs into the lungs every 6 (six) hours as needed for wheezing or shortness of breath. 04/14/20   Lucy Chris, PA  alum & mag hydroxide-simeth (MAALOX MAX) 400-400-40 MG/5ML suspension Take 5 mLs by mouth every 6 (six) hours as needed for indigestion. 04/02/20   Nita Sickle, MD  benzonatate (TESSALON PERLES) 100 MG capsule Take 1 capsule (100 mg total) by mouth 3 (three) times daily as needed for cough. 04/14/20 04/14/21  Lucy Chris, PA  cyclobenzaprine (FLEXERIL) 5 MG tablet 1 tablet every 8 hours as needed for muscle spasms 03/05/20   Irean Hong, MD  cyclobenzaprine (FLEXERIL) 5 MG tablet 1 tablet every 8 hours as needed for muscle spasms 03/05/20   Irean Hong, MD  famotidine (PEPCID) 20 MG tablet Take 1 tablet (20 mg total) by mouth 2 (two) times daily for 7 days. 10/05/19 10/12/19  Menshew, Charlesetta Ivory, PA-C  naproxen (NAPROSYN) 500 MG tablet Take 1 tablet (500 mg total) by mouth 2  (two) times daily with a meal. 04/01/20   Irean Hong, MD  pantoprazole (PROTONIX) 40 MG tablet Take 1 tablet (40 mg total) by mouth daily. 04/02/20 04/02/21  Nita Sickle, MD  prochlorperazine (COMPAZINE) 10 MG tablet Take 1 tablet (10 mg total) by mouth every 6 (six) hours as needed for nausea (headache). 04/01/20   Irean Hong, MD  cetirizine (ZYRTEC) 10 MG tablet Take 1 tablet (10 mg total) by mouth daily. Patient not taking: Reported on 11/26/2018 01/27/16 10/05/19  Cuthriell, Delorise Royals, PA-C  dicyclomine (BENTYL) 20 MG tablet Take 1 tablet (20 mg total) by mouth 3 (three) times daily as needed for spasms. 04/12/19 10/05/19  Minna Antis, MD  loratadine (CLARITIN) 10 MG tablet Take 1 tablet (10 mg total) by mouth daily for 14 days. 09/13/18 10/05/19  Shaune Pollack, MD  promethazine (PHENERGAN) 25 MG tablet Take 1 tablet (25 mg total) by mouth every 6 (six) hours as needed for nausea or vomiting. Patient not taking: Reported on 11/26/2018 05/07/16 10/05/19  Evangeline Dakin, PA-C     Allergies No known allergies  History reviewed. No pertinent family history.  Social History Social History   Tobacco Use  . Smoking status: Current Every Day Smoker    Packs/day: 0.50    Types: Cigarettes  . Smokeless tobacco: Never Used  Vaping Use  . Vaping Use: Never used  Substance Use Topics  . Alcohol use: Not Currently  . Drug use: No    Review of Systems  Constitutional: No fever/chills Eyes: No visual changes.  ENT: No sore throat. Cardiovascular: As above Respiratory: Denies shortness of breath. Gastrointestinal: No abdominal pain.  No nausea, no vomiting.   Genitourinary: Negative for dysuria. Musculoskeletal: Negative for back pain. Skin: Negative for rash. Neurological: Negative for headaches   ____________________________________________   PHYSICAL EXAM:  VITAL SIGNS: ED Triage Vitals  Enc Vitals Group     BP 08/02/20 1937 123/87     Pulse Rate 08/02/20  1937 63     Resp 08/02/20 1937 16     Temp 08/02/20 1937 97.8 F (36.6 C)     Temp Source 08/02/20 1937 Oral     SpO2 08/02/20 1937 100 %     Weight 08/02/20 1938 81.6 kg (180 lb)     Height 08/02/20 1938 1.854 m (6\' 1" )     Head Circumference --      Peak Flow --      Pain Score 08/02/20 1938 7     Pain Loc --      Pain Edu? --      Excl. in GC? --     Constitutional: Alert and oriented. No acute distress.  Nose: No congestion/rhinnorhea. Mouth/Throat: Mucous membranes are moist.    Cardiovascular: Normal rate, regular rhythm. Grossly normal heart sounds.  Good peripheral circulation. Respiratory: Normal respiratory effort.  No retractions. Lungs CTAB. Gastrointestinal: Soft and nontender. No distention.  No CVA tenderness.  Musculoskeletal:  Warm and well perfused Neurologic:  Normal speech and language. No gross focal neurologic deficits are appreciated.  Skin:  Skin is warm, dry and intact. No rash noted. Psychiatric: Mood and affect are normal. Speech and behavior are normal.  ____________________________________________   LABS (all labs ordered are listed, but only abnormal results are displayed)  Labs Reviewed - No data to display ____________________________________________  EKG  ED ECG REPORT I, 08/04/20, the attending physician, personally viewed and interpreted this ECG.  Date: 08/02/2020  Rhythm: normal sinus rhythm QRS Axis: normal Intervals: normal ST/T Wave abnormalities: normal Narrative Interpretation: no evidence of acute ischemia  ____________________________________________  RADIOLOGY  None ____________________________________________   PROCEDURES  Procedure(s) performed: No  Procedures   Critical Care performed: No ____________________________________________   INITIAL IMPRESSION / ASSESSMENT AND PLAN / ED COURSE  Pertinent labs & imaging results that were available during my care of the patient were reviewed by me and  considered in my medical decision making (see chart for details).  Patient well-appearing and asymptomatic at this time.  Vital signs are quite reassuring, EKG is normal.  Patient has had this multiple times in the past, has also apparently seen cardiology for this with normal stress test.  No pleurisy or shortness of breath.  Again asymptomatic here, no indication for further work-up, we will have the patient follow-up closely with cardiology    ____________________________________________   FINAL CLINICAL IMPRESSION(S) / ED DIAGNOSES  Final diagnoses:  Atypical chest pain        Note:  This document was prepared using Dragon voice recognition software and may include unintentional dictation errors.   08/04/2020, MD 08/02/20 [phone]

## 2020-08-16 ENCOUNTER — Emergency Department
Admission: EM | Admit: 2020-08-16 | Discharge: 2020-08-16 | Disposition: A | Discharge status: Home / Self Care | Payer: Self-pay | Attending: Emergency Medicine | Admitting: Emergency Medicine

## 2020-08-16 ENCOUNTER — Emergency Department: Payer: Self-pay

## 2020-08-16 ENCOUNTER — Other Ambulatory Visit: Payer: Self-pay

## 2020-08-16 DIAGNOSIS — R059 Cough, unspecified: Secondary | ICD-10-CM | POA: Insufficient documentation

## 2020-08-16 DIAGNOSIS — R0602 Shortness of breath: Secondary | ICD-10-CM | POA: Insufficient documentation

## 2020-08-16 DIAGNOSIS — R0981 Nasal congestion: Secondary | ICD-10-CM | POA: Insufficient documentation

## 2020-08-16 DIAGNOSIS — R079 Chest pain, unspecified: Secondary | ICD-10-CM | POA: Insufficient documentation

## 2020-08-16 DIAGNOSIS — F17210 Nicotine dependence, cigarettes, uncomplicated: Secondary | ICD-10-CM | POA: Insufficient documentation

## 2020-08-16 LAB — CBC WITH DIFFERENTIAL/PLATELET
Abs Immature Granulocytes: 0.02 10*3/uL (ref 0.00–0.07)
Basophils Absolute: 0.1 10*3/uL (ref 0.0–0.1)
Basophils Relative: 1 %
Eosinophils Absolute: 0.2 10*3/uL (ref 0.0–0.5)
Eosinophils Relative: 3 %
HCT: 45 % (ref 39.0–52.0)
Hemoglobin: 14.9 g/dL (ref 13.0–17.0)
Immature Granulocytes: 0 %
Lymphocytes Relative: 36 %
Lymphs Abs: 2.8 10*3/uL (ref 0.7–4.0)
MCH: 28.5 pg (ref 26.0–34.0)
MCHC: 33.1 g/dL (ref 30.0–36.0)
MCV: 86.2 fL (ref 80.0–100.0)
Monocytes Absolute: 0.4 10*3/uL (ref 0.1–1.0)
Monocytes Relative: 6 %
Neutro Abs: 4.3 10*3/uL (ref 1.7–7.7)
Neutrophils Relative %: 54 %
Platelets: 208 10*3/uL (ref 150–400)
RBC: 5.22 MIL/uL (ref 4.22–5.81)
RDW: 12.3 % (ref 11.5–15.5)
WBC: 7.9 10*3/uL (ref 4.0–10.5)
nRBC: 0 % (ref 0.0–0.2)

## 2020-08-16 LAB — BASIC METABOLIC PANEL
Anion gap: 9 (ref 5–15)
BUN: 15 mg/dL (ref 6–20)
CO2: 25 mmol/L (ref 22–32)
Calcium: 8.8 mg/dL — ABNORMAL LOW (ref 8.9–10.3)
Chloride: 104 mmol/L (ref 98–111)
Creatinine, Ser: 0.92 mg/dL (ref 0.61–1.24)
GFR, Estimated: >60 mL/min (ref >60)
Glucose, Bld: 114 mg/dL — ABNORMAL HIGH (ref 70–99)
Potassium: 3.9 mmol/L (ref 3.5–5.1)
Sodium: 138 mmol/L (ref 135–145)

## 2020-08-16 LAB — TROPONIN I (HIGH SENSITIVITY): Troponin I (High Sensitivity): <2 ng/L (ref <18)

## 2020-08-16 MED ORDER — BENZONATATE 100 MG PO CAPS
100.0000 mg | ORAL_CAPSULE | Freq: Once | ORAL | Status: AC
  Administered 2020-08-16: 100 mg via ORAL
  Filled 2020-08-16: qty 1

## 2020-08-16 MED ORDER — BENZONATATE 100 MG PO CAPS: 100.0000 mg | ORAL_CAPSULE | Freq: Three times a day (TID) | ORAL | 0 refills | Status: DC | PRN

## 2020-08-16 MED ORDER — IBUPROFEN 600 MG PO TABS
600.0000 mg | ORAL_TABLET | Freq: Once | ORAL | Status: DC
  Filled 2020-08-16: qty 1

## 2020-08-16 NOTE — ED Notes (Signed)
ED Provider at bedside. 

## 2020-08-16 NOTE — ED Provider Notes (Signed)
[facility] Emergency Department Provider Note   ____________________________________________   Event Date/Time   First MD Initiated Contact with Patient 08/16/20 2049     (approximate)  I have reviewed the triage vital signs and the nursing notes.   HISTORY  Chief Complaint Cough   HPI Patrick Huffman is a 30 y.o. male who reports to the emergency department for evaluation of a cough and right-sided chest pain.  Patient states that he has had a cough ongoing for the last 3 months ever since he had a diagnosis of COVID.  He reports occasional intermittent shortness of breath but states this is not constant.  Patient reports that today, he had acute onset of right-sided chest pain after an episode of coughing that has now persisted and being constant.  It is worse with palpation of the right chest wall and worse with deep breaths.  He denies any fever, chills or body aches.  He reports persistent nasal congestion for which she has been taking Mucinex and other over-the-counter agents for "a long time".  He states that he regularly performs home test for COVID and has not recently been positive.         Past Medical History:  Diagnosis Date  . Back pain   . Heart murmur   . Hernia, inguinal     There are no problems to display for this patient.   History reviewed. No pertinent surgical history.  Prior to Admission medications   Medication Sig Start Date End Date Taking? Authorizing Provider  benzonatate (TESSALON PERLES) 100 MG capsule Take 1 capsule (100 mg total) by mouth 3 (three) times daily as needed for cough. 08/16/20 08/16/21 Yes Marieke Lubke, Ruben Gottron, PA  albuterol (VENTOLIN HFA) 108 (90 Base) MCG/ACT inhaler Inhale 2 puffs into the lungs every 6 (six) hours as needed for wheezing or shortness of breath. 04/14/20   Lucy Chris, PA  alum & mag hydroxide-simeth (MAALOX MAX) 400-400-40 MG/5ML suspension Take 5 mLs by mouth every 6 (six) hours as  needed for indigestion. 04/02/20   Nita Sickle, MD  cyclobenzaprine (FLEXERIL) 5 MG tablet 1 tablet every 8 hours as needed for muscle spasms 03/05/20   Irean Hong, MD  cyclobenzaprine (FLEXERIL) 5 MG tablet 1 tablet every 8 hours as needed for muscle spasms 03/05/20   Irean Hong, MD  famotidine (PEPCID) 20 MG tablet Take 1 tablet (20 mg total) by mouth 2 (two) times daily for 7 days. 10/05/19 10/12/19  Menshew, Charlesetta Ivory, PA-C  naproxen (NAPROSYN) 500 MG tablet Take 1 tablet (500 mg total) by mouth 2 (two) times daily with a meal. 04/01/20   Irean Hong, MD  pantoprazole (PROTONIX) 40 MG tablet Take 1 tablet (40 mg total) by mouth daily. 04/02/20 04/02/21  Nita Sickle, MD  prochlorperazine (COMPAZINE) 10 MG tablet Take 1 tablet (10 mg total) by mouth every 6 (six) hours as needed for nausea (headache). 04/01/20   Irean Hong, MD  cetirizine (ZYRTEC) 10 MG tablet Take 1 tablet (10 mg total) by mouth daily. Patient not taking: Reported on 11/26/2018 01/27/16 10/05/19  Cuthriell, Delorise Royals, PA-C  dicyclomine (BENTYL) 20 MG tablet Take 1 tablet (20 mg total) by mouth 3 (three) times daily as needed for spasms. 04/12/19 10/05/19  Minna Antis, MD  loratadine (CLARITIN) 10 MG tablet Take 1 tablet (10 mg total) by mouth daily for 14 days. 09/13/18 10/05/19  Shaune Pollack, MD  promethazine (PHENERGAN) 25 MG tablet  Take 1 tablet (25 mg total) by mouth every 6 (six) hours as needed for nausea or vomiting. Patient not taking: Reported on 11/26/2018 05/07/16 10/05/19  Evangeline Dakin, PA-C    Allergies No known allergies  History reviewed. No pertinent family history.  Social History Social History   Tobacco Use  . Smoking status: Current Every Day Smoker    Packs/day: 0.50    Types: Cigarettes  . Smokeless tobacco: Never Used  Vaping Use  . Vaping Use: Never used  Substance Use Topics  . Alcohol use: Not Currently  . Drug use: No    Review of Systems Constitutional:  No fever/chills Eyes: No visual changes. ENT: No sore throat. Cardiovascular: + chest pain. Respiratory: + Cough, + intermittent shortness of breath. Gastrointestinal: No abdominal pain.  No nausea, no vomiting.  No diarrhea.  No constipation. Genitourinary: Negative for dysuria. Musculoskeletal: Negative for back pain. Skin: Negative for rash. Neurological: Negative for headaches, focal weakness or numbness.   ____________________________________________   PHYSICAL EXAM:  VITAL SIGNS: ED Triage Vitals  Enc Vitals Group     BP 08/16/20 1922 129/85     Pulse Rate 08/16/20 1922 83     Resp 08/16/20 1922 18     Temp 08/16/20 1922 97.9 F (36.6 C)     Temp Source 08/16/20 1922 Oral     SpO2 08/16/20 1922 97 %     Weight 08/16/20 1923 185 lb (83.9 kg)     Height 08/16/20 1923 6' (1.829 m)     Head Circumference --      Peak Flow --      Pain Score 08/16/20 1922 8     Pain Loc --      Pain Edu? --      Excl. in GC? --    Constitutional: Alert and oriented. Well appearing and in no acute distress. Eyes: Conjunctivae are normal. PERRL. EOMI. Head: Atraumatic. Nose: No congestion/rhinnorhea. Mouth/Throat: Mucous membranes are moist.  Oropharynx non-erythematous. Neck: No stridor.   Cardiovascular: Chest pain on the right side reproducible with palpation of the right anterior chest wall and mid sternum.  Normal rate, regular rhythm. Grossly normal heart sounds.  Good peripheral circulation. Respiratory: Normal respiratory effort.  No retractions. Lungs CTAB. Gastrointestinal: Soft and nontender. No distention. No abdominal bruits. No CVA tenderness. Musculoskeletal: No lower extremity tenderness nor edema.  No joint effusions. Neurologic:  Normal speech and language. No gross focal neurologic deficits are appreciated. No gait instability. Skin:  Skin is warm, dry and intact. No rash noted. Psychiatric: Mood and affect are normal. Speech and behavior are  normal.  ____________________________________________   LABS (all labs ordered are listed, but only abnormal results are displayed)  Labs Reviewed  BASIC METABOLIC PANEL - Abnormal; Notable for the following components:      Result Value   Glucose, Bld 114 (*)    Calcium 8.8 (*)    All other components within normal limits  CBC WITH DIFFERENTIAL/PLATELET  TROPONIN I (HIGH SENSITIVITY)  TROPONIN I (HIGH SENSITIVITY)   ____________________________________________  EKG  Sinus rhythm with a rate of 92 bpm.  No ST elevations or depressions to suggest acute ischemia. ____________________________________________  RADIOLOGY I, Lucy Chris, personally viewed and evaluated these images (plain radiographs) as part of my medical decision making, as well as reviewing the written report by the radiologist.  ED provider interpretation: No focal infiltrate or other acute pathology noted  Official radiology report(s): DG Chest 2 View  Result  Date: 08/16/2020 CLINICAL DATA:  Right-sided chest pain EXAM: CHEST - 2 VIEW COMPARISON:  05/19/2020 FINDINGS: The heart size and mediastinal contours are within normal limits. Both lungs are clear. The visualized skeletal structures are unremarkable. IMPRESSION: No active cardiopulmonary disease. Electronically Signed   By: Jasmine Pang M.D.   On: 08/16/2020 22:02    ____________________________________________   INITIAL IMPRESSION / ASSESSMENT AND PLAN / ED COURSE  As part of my medical decision making, I reviewed the following data within the electronic MEDICAL RECORD NUMBER Nursing notes reviewed and incorporated, Labs reviewed, Radiograph reviewed and Notes from prior ED visits        Patient is a 30 year old male who presents to the emergency department for evaluation of right-sided chest pain that began this morning as well as a cough that is been going on for the last 3 months ever since his diagnosis of COVID see HPI for further details.  In  triage, the patient has normal vital signs.  On physical exam, his chest pain is reproducible to palpation of the right chest wall he does not have any abnormal auscultation of the heart or lungs and remainder of exam is within normal limits.  Labs were obtained and he has no leukocytosis to suggest acute infection, troponin is negative and BMP is grossly unremarkable.  He is also PERC negative, making them unlikely to have PE as the cause of his symptoms.  Offered trial of steroid taper for his persistent cough to which the patient declined.  He was also offered nonsteroidal anti-inflammatories with cough medication but reports that he cannot swallow pills, but then later reports to me that he is willing to swallow pills if they are small.  He agreed to trial anti-inflammatory with Jerilynn Som, however when the nurse went to medicate him he declined his NSAID again.  At this time, there is no obvious cardiac cause for his chest pain that began earlier today.  Return precautions were discussed with the patient and he was encouraged to have PCP follow-up given the longevity of his symptoms.  Patient stable at this time for outpatient follow-up.      ____________________________________________   FINAL CLINICAL IMPRESSION(S) / ED DIAGNOSES  Final diagnoses:  Cough     ED Discharge Orders         Ordered    benzonatate (TESSALON PERLES) 100 MG capsule  3 times daily PRN        08/16/20 2322          *Please note:  Patrick Huffman was evaluated in Emergency Department on 08/16/2020 for the symptoms described in the history of present illness. He was evaluated in the context of the global COVID-19 pandemic, which necessitated consideration that the patient might be at risk for infection with the SARS-CoV-2 virus that causes COVID-19. Institutional protocols and algorithms that pertain to the evaluation of patients at risk for COVID-19 are in a state of rapid change based on information  released by regulatory bodies including the CDC and federal and state organizations. These policies and algorithms were followed during the patient's care in the ED.  Some ED evaluations and interventions may be delayed as a result of limited staffing during and the pandemic.*   Note:  This document was prepared using Dragon voice recognition software and may include unintentional dictation errors.   Lucy Chris, PA 08/16/20 2354    Concha Se, MD 08/17/20 0600

## 2020-08-16 NOTE — ED Triage Notes (Signed)
Pt reports cough "for 3 months," also reports right sided chest pain. Reports pain is constant, started today, tender to palpation. Denies cardiac hx. Pt reports cough is productive. Denies fevers, states had negative covid test 2 weeks ago.

## 2020-08-16 NOTE — Discharge Instructions (Signed)
Please take ibuprofen for your right sided chest wall pain. Take the tessalon perles for your cough. Follow up with primary care if not improving.

## 2020-08-16 NOTE — ED Notes (Signed)
Patient transported to X-ray 

## 2020-09-13 ENCOUNTER — Other Ambulatory Visit: Payer: Self-pay

## 2020-09-13 ENCOUNTER — Emergency Department
Admission: EM | Admit: 2020-09-13 | Discharge: 2020-09-13 | Disposition: A | Discharge status: Home / Self Care | Payer: Medicaid Other | Attending: Emergency Medicine | Admitting: Emergency Medicine

## 2020-09-13 ENCOUNTER — Encounter: Payer: Self-pay | Admitting: Emergency Medicine

## 2020-09-13 ENCOUNTER — Emergency Department: Payer: Medicaid Other

## 2020-09-13 DIAGNOSIS — R55 Syncope and collapse: Secondary | ICD-10-CM | POA: Insufficient documentation

## 2020-09-13 DIAGNOSIS — M542 Cervicalgia: Secondary | ICD-10-CM | POA: Insufficient documentation

## 2020-09-13 DIAGNOSIS — Z5321 Procedure and treatment not carried out due to patient leaving prior to being seen by health care provider: Secondary | ICD-10-CM | POA: Insufficient documentation

## 2020-09-13 DIAGNOSIS — M25519 Pain in unspecified shoulder: Secondary | ICD-10-CM | POA: Insufficient documentation

## 2020-09-13 DIAGNOSIS — R079 Chest pain, unspecified: Secondary | ICD-10-CM | POA: Insufficient documentation

## 2020-09-13 LAB — BASIC METABOLIC PANEL
Anion gap: 9 (ref 5–15)
BUN: 13 mg/dL (ref 6–20)
CO2: 21 mmol/L — ABNORMAL LOW (ref 22–32)
Calcium: 8.6 mg/dL — ABNORMAL LOW (ref 8.9–10.3)
Chloride: 106 mmol/L (ref 98–111)
Creatinine, Ser: 0.86 mg/dL (ref 0.61–1.24)
GFR, Estimated: >60 mL/min (ref >60)
Glucose, Bld: 87 mg/dL (ref 70–99)
Potassium: 3.5 mmol/L (ref 3.5–5.1)
Sodium: 136 mmol/L (ref 135–145)

## 2020-09-13 LAB — CBC
HCT: 42.8 % (ref 39.0–52.0)
Hemoglobin: 14.5 g/dL (ref 13.0–17.0)
MCH: 28.7 pg (ref 26.0–34.0)
MCHC: 33.9 g/dL (ref 30.0–36.0)
MCV: 84.6 fL (ref 80.0–100.0)
Platelets: 214 10*3/uL (ref 150–400)
RBC: 5.06 MIL/uL (ref 4.22–5.81)
RDW: 12.2 % (ref 11.5–15.5)
WBC: 8 10*3/uL (ref 4.0–10.5)
nRBC: 0 % (ref 0.0–0.2)

## 2020-09-13 LAB — TROPONIN I (HIGH SENSITIVITY): Troponin I (High Sensitivity): <2 ng/L (ref <18)

## 2020-09-13 NOTE — ED Triage Notes (Signed)
Pt in via [facility] from his moms house with c/o CP, pressure like that radiated to his shoulder and neck. Pt calmed down upon EMS arrival and refused transport so they left and then he called them back for a near syncopal episode.   #20 to rt AC, 115 systolic, pt was given 324mg  of asa

## 2020-11-07 DIAGNOSIS — Z5321 Procedure and treatment not carried out due to patient leaving prior to being seen by health care provider: Secondary | ICD-10-CM | POA: Insufficient documentation

## 2020-11-07 DIAGNOSIS — R110 Nausea: Secondary | ICD-10-CM | POA: Insufficient documentation

## 2020-11-07 DIAGNOSIS — M542 Cervicalgia: Secondary | ICD-10-CM | POA: Insufficient documentation

## 2020-11-07 DIAGNOSIS — R6883 Chills (without fever): Secondary | ICD-10-CM | POA: Insufficient documentation

## 2020-11-07 DIAGNOSIS — R519 Headache, unspecified: Secondary | ICD-10-CM | POA: Insufficient documentation

## 2020-11-07 LAB — COMPREHENSIVE METABOLIC PANEL
ALT: 58 U/L — ABNORMAL HIGH (ref 0–44)
AST: 32 U/L (ref 15–41)
Albumin: 4.3 g/dL (ref 3.5–5.0)
Alkaline Phosphatase: 63 U/L (ref 38–126)
Anion gap: 6 (ref 5–15)
BUN: 19 mg/dL (ref 6–20)
CO2: 26 mmol/L (ref 22–32)
Calcium: 8.8 mg/dL — ABNORMAL LOW (ref 8.9–10.3)
Chloride: 105 mmol/L (ref 98–111)
Creatinine, Ser: 1.12 mg/dL (ref 0.61–1.24)
GFR, Estimated: >60 mL/min (ref >60)
Glucose, Bld: 108 mg/dL — ABNORMAL HIGH (ref 70–99)
Potassium: 3.8 mmol/L (ref 3.5–5.1)
Sodium: 137 mmol/L (ref 135–145)
Total Bilirubin: 0.7 mg/dL (ref 0.3–1.2)
Total Protein: 7.5 g/dL (ref 6.5–8.1)

## 2020-11-07 LAB — CBC WITH DIFFERENTIAL/PLATELET
Abs Immature Granulocytes: 0.02 10*3/uL (ref 0.00–0.07)
Basophils Absolute: 0.1 10*3/uL (ref 0.0–0.1)
Basophils Relative: 1 %
Eosinophils Absolute: 0.2 10*3/uL (ref 0.0–0.5)
Eosinophils Relative: 3 %
HCT: 40.9 % (ref 39.0–52.0)
Hemoglobin: 13.8 g/dL (ref 13.0–17.0)
Immature Granulocytes: 0 %
Lymphocytes Relative: 42 %
Lymphs Abs: 3.6 10*3/uL (ref 0.7–4.0)
MCH: 29.2 pg (ref 26.0–34.0)
MCHC: 33.7 g/dL (ref 30.0–36.0)
MCV: 86.7 fL (ref 80.0–100.0)
Monocytes Absolute: 0.5 10*3/uL (ref 0.1–1.0)
Monocytes Relative: 6 %
Neutro Abs: 4.2 10*3/uL (ref 1.7–7.7)
Neutrophils Relative %: 48 %
Platelets: 199 10*3/uL (ref 150–400)
RBC: 4.72 MIL/uL (ref 4.22–5.81)
RDW: 12.7 % (ref 11.5–15.5)
WBC: 8.6 10*3/uL (ref 4.0–10.5)
nRBC: 0 % (ref 0.0–0.2)

## 2020-11-07 LAB — T4, FREE: Free T4: 0.99 ng/dL (ref 0.61–1.12)

## 2020-11-07 NOTE — ED Triage Notes (Signed)
Patient presents to ER from home. Patient reports he has had headache, chills and feeling hot, neck pain, nausea and recent weight gain. Patient reports feeling near syncopal all day yesterday. Patient A&OX3.

## 2020-11-08 ENCOUNTER — Emergency Department
Admission: EM | Admit: 2020-11-08 | Discharge: 2020-11-08 | Disposition: A | Discharge status: Home / Self Care | Payer: Medicaid Other | Attending: Emergency Medicine | Admitting: Emergency Medicine

## 2020-11-08 NOTE — ED Notes (Signed)
Pt no answer when called for rpt VS.  Pt not visualized in lobby.  Pt did not answer phone when called.  Pt did not notify staff of his departure.

## 2020-11-29 ENCOUNTER — Ambulatory Visit (INDEPENDENT_AMBULATORY_CARE_PROVIDER_SITE_OTHER): Payer: Self-pay | Admitting: Family Medicine

## 2020-11-29 ENCOUNTER — Encounter: Payer: Self-pay | Admitting: Family Medicine

## 2020-11-29 ENCOUNTER — Encounter: Payer: Self-pay | Admitting: Emergency Medicine

## 2020-11-29 ENCOUNTER — Other Ambulatory Visit: Payer: Self-pay

## 2020-11-29 ENCOUNTER — Ambulatory Visit: Payer: Self-pay

## 2020-11-29 ENCOUNTER — Emergency Department: Payer: Medicaid Other

## 2020-11-29 ENCOUNTER — Emergency Department
Admission: EM | Admit: 2020-11-29 | Discharge: 2020-11-29 | Disposition: A | Discharge status: Home / Self Care | Payer: Medicaid Other | Attending: Emergency Medicine | Admitting: Emergency Medicine

## 2020-11-29 DIAGNOSIS — R0789 Other chest pain: Secondary | ICD-10-CM | POA: Insufficient documentation

## 2020-11-29 DIAGNOSIS — M79602 Pain in left arm: Secondary | ICD-10-CM | POA: Insufficient documentation

## 2020-11-29 DIAGNOSIS — R0602 Shortness of breath: Secondary | ICD-10-CM | POA: Insufficient documentation

## 2020-11-29 DIAGNOSIS — F17210 Nicotine dependence, cigarettes, uncomplicated: Secondary | ICD-10-CM | POA: Insufficient documentation

## 2020-11-29 DIAGNOSIS — R079 Chest pain, unspecified: Secondary | ICD-10-CM

## 2020-11-29 DIAGNOSIS — M25531 Pain in right wrist: Secondary | ICD-10-CM

## 2020-11-29 LAB — CBC
HCT: 42.8 % (ref 39.0–52.0)
Hemoglobin: 14.6 g/dL (ref 13.0–17.0)
MCH: 30 pg (ref 26.0–34.0)
MCHC: 34.1 g/dL (ref 30.0–36.0)
MCV: 87.9 fL (ref 80.0–100.0)
Platelets: 232 10*3/uL (ref 150–400)
RBC: 4.87 MIL/uL (ref 4.22–5.81)
RDW: 12.9 % (ref 11.5–15.5)
WBC: 9.7 10*3/uL (ref 4.0–10.5)
nRBC: 0 % (ref 0.0–0.2)

## 2020-11-29 LAB — BASIC METABOLIC PANEL
Anion gap: 5 (ref 5–15)
BUN: 17 mg/dL (ref 6–20)
CO2: 26 mmol/L (ref 22–32)
Calcium: 8.7 mg/dL — ABNORMAL LOW (ref 8.9–10.3)
Chloride: 105 mmol/L (ref 98–111)
Creatinine, Ser: 1 mg/dL (ref 0.61–1.24)
GFR, Estimated: >60 mL/min (ref >60)
Glucose, Bld: 95 mg/dL (ref 70–99)
Potassium: 3.8 mmol/L (ref 3.5–5.1)
Sodium: 136 mmol/L (ref 135–145)

## 2020-11-29 LAB — TROPONIN I (HIGH SENSITIVITY)
Troponin I (High Sensitivity): 5 ng/L (ref <18)
Troponin I (High Sensitivity): 5 ng/L (ref <18)

## 2020-11-29 NOTE — ED Provider Notes (Signed)
[facility] Emergency Department Provider Note   ____________________________________________   Event Date/Time   First MD Initiated Contact with Patient 11/29/20 [phone]     (approximate)  I have reviewed the triage vital signs and the nursing notes.   HISTORY  Chief Complaint Chest Pain    HPI Patrick Huffman is a 30 y.o. male with no significant past medical history who presents to the ED complaining of chest pain.  Patient reports that last night he had sudden onset of soreness in his left arm that seem to travel backwards and into his chest.  He describes a feeling of tightness in his chest along with difficulty catching his breath.  He denies any falls or recent injuries to his left arm.  Pain in his chest as well as difficulty breathing have now improved.  He has not had any recent fevers, cough, nausea, vomiting, abdominal pain, or diarrhea.  He does describe episodes of similar pain in the past and states he has been under significant stress.        Past Medical History:  Diagnosis Date   Back pain    Heart murmur    Hernia, inguinal     There are no problems to display for this patient.   History reviewed. No pertinent surgical history.  Prior to Admission medications   Medication Sig Start Date End Date Taking? Authorizing Provider  albuterol (VENTOLIN HFA) 108 (90 Base) MCG/ACT inhaler Inhale 2 puffs into the lungs every 6 (six) hours as needed for wheezing or shortness of breath. 04/14/20   Lucy Chris, PA  alum & mag hydroxide-simeth (MAALOX MAX) 400-400-40 MG/5ML suspension Take 5 mLs by mouth every 6 (six) hours as needed for indigestion. 04/02/20   Nita Sickle, MD  benzonatate (TESSALON PERLES) 100 MG capsule Take 1 capsule (100 mg total) by mouth 3 (three) times daily as needed for cough. 08/16/20 08/16/21  Lucy Chris, PA  cyclobenzaprine (FLEXERIL) 5 MG tablet 1 tablet every 8 hours as needed for muscle spasms  03/05/20   Irean Hong, MD  cyclobenzaprine (FLEXERIL) 5 MG tablet 1 tablet every 8 hours as needed for muscle spasms 03/05/20   Irean Hong, MD  famotidine (PEPCID) 20 MG tablet Take 1 tablet (20 mg total) by mouth 2 (two) times daily for 7 days. 10/05/19 10/12/19  Menshew, Charlesetta Ivory, PA-C  naproxen (NAPROSYN) 500 MG tablet Take 1 tablet (500 mg total) by mouth 2 (two) times daily with a meal. 04/01/20   Irean Hong, MD  pantoprazole (PROTONIX) 40 MG tablet Take 1 tablet (40 mg total) by mouth daily. 04/02/20 04/02/21  Nita Sickle, MD  prochlorperazine (COMPAZINE) 10 MG tablet Take 1 tablet (10 mg total) by mouth every 6 (six) hours as needed for nausea (headache). 04/01/20   Irean Hong, MD  cetirizine (ZYRTEC) 10 MG tablet Take 1 tablet (10 mg total) by mouth daily. Patient not taking: Reported on 11/26/2018 01/27/16 10/05/19  Cuthriell, Delorise Royals, PA-C  dicyclomine (BENTYL) 20 MG tablet Take 1 tablet (20 mg total) by mouth 3 (three) times daily as needed for spasms. 04/12/19 10/05/19  Minna Antis, MD  loratadine (CLARITIN) 10 MG tablet Take 1 tablet (10 mg total) by mouth daily for 14 days. 09/13/18 10/05/19  Shaune Pollack, MD  promethazine (PHENERGAN) 25 MG tablet Take 1 tablet (25 mg total) by mouth every 6 (six) hours as needed for nausea or vomiting. Patient not taking: Reported on 11/26/2018  05/07/16 10/05/19  Beers, Charmayne Sheer, PA-C    Allergies No known allergies  No family history on file.  Social History Social History   Tobacco Use   Smoking status: Every Day    Packs/day: 0.50    Types: Cigarettes   Smokeless tobacco: Never  Vaping Use   Vaping Use: Never used  Substance Use Topics   Alcohol use: Not Currently   Drug use: No    Review of Systems  Constitutional: No fever/chills Eyes: No visual changes. ENT: No sore throat. Cardiovascular: Positive for chest pain. Respiratory: Denies shortness of breath. Gastrointestinal: No abdominal pain.  No  nausea, no vomiting.  No diarrhea.  No constipation. Genitourinary: Negative for dysuria. Musculoskeletal: Negative for back pain.  Positive for left arm pain. Skin: Negative for rash. Neurological: Negative for headaches, focal weakness or numbness.  ____________________________________________   PHYSICAL EXAM:  VITAL SIGNS: ED Triage Vitals  Enc Vitals Group     BP 11/29/20 0110 132/70     Pulse Rate 11/29/20 0110 80     Resp 11/29/20 0110 20     Temp 11/29/20 0110 97.9 F (36.6 C)     Temp Source 11/29/20 0110 Oral     SpO2 11/29/20 0110 99 %     Weight 11/29/20 0111 200 lb (90.7 kg)     Height 11/29/20 0111 6' (1.829 m)     Head Circumference --      Peak Flow --      Pain Score 11/29/20 0111 10     Pain Loc --      Pain Edu? --      Excl. in GC? --     Constitutional: Alert and oriented. Eyes: Conjunctivae are normal. Head: Atraumatic. Nose: No congestion/rhinnorhea. Mouth/Throat: Mucous membranes are moist. Neck: Normal ROM Cardiovascular: Normal rate, regular rhythm. Grossly normal heart sounds.  2+ radial pulses bilaterally. Respiratory: Normal respiratory effort.  No retractions. Lungs CTAB.  No chest wall tenderness to palpation. Gastrointestinal: Soft and nontender. No distention. Genitourinary: deferred Musculoskeletal: No lower extremity tenderness nor edema. Neurologic:  Normal speech and language. No gross focal neurologic deficits are appreciated. Skin:  Skin is warm, dry and intact. No rash noted. Psychiatric: Mood and affect are normal. Speech and behavior are normal.  ____________________________________________   LABS (all labs ordered are listed, but only abnormal results are displayed)  Labs Reviewed  BASIC METABOLIC PANEL - Abnormal; Notable for the following components:      Result Value   Calcium 8.7 (*)    All other components within normal limits  CBC  TROPONIN I (HIGH SENSITIVITY)  TROPONIN I (HIGH SENSITIVITY)    ____________________________________________  EKG  ED ECG REPORT I, Chesley Noon, the attending physician, personally viewed and interpreted this ECG.   Date: 11/29/2020  EKG Time: 1:13  Rate: 83  Rhythm: normal EKG, normal sinus rhythm, unchanged from previous tracings  Axis: Normal  Intervals:none  ST&T Change: None   PROCEDURES  Procedure(s) performed (including Critical Care):  Procedures   ____________________________________________   INITIAL IMPRESSION / ASSESSMENT AND PLAN / ED COURSE      30 year old male with no significant past medical history presents to the ED complaining of pain in his left arm and chest starting last night associated with increasing difficulty breathing.  Patient now states that symptoms have resolved, work-up has been unremarkable.  EKG shows no evidence of arrhythmia or ischemia and 2 sets of troponin are negative.  He is neurovascularly intact to his  bilateral upper extremities.  Chest x-ray reviewed by me and shows no infiltrate, edema, or effusion.  There does appear to be a component of anxiety and patient has been seen in the ED on multiple occasions in the past for similar pain in his chest.  He is appropriate for discharge home with PCP follow-up, was counseled to return to the ED for new or worsening symptoms, patient agrees with plan.      ____________________________________________   FINAL CLINICAL IMPRESSION(S) / ED DIAGNOSES  Final diagnoses:  Nonspecific chest pain     ED Discharge Orders     None        Note:  This document was prepared using Dragon voice recognition software and may include unintentional dictation errors.    Chesley Noon, MD 11/29/20 [phone]

## 2020-11-29 NOTE — Progress Notes (Signed)
Opened in error

## 2020-11-29 NOTE — ED Triage Notes (Signed)
Patient ambulatory to triage with steady gait, without difficulty or distress noted pt reports mid CP radiating into left arm accomp by CP tonight

## 2021-01-22 ENCOUNTER — Ambulatory Visit: Payer: Self-pay | Admitting: Cardiovascular Disease

## 2021-01-22 NOTE — Progress Notes (Deleted)
NO SHOW

## 2021-01-24 ENCOUNTER — Encounter: Payer: Self-pay | Admitting: Cardiovascular Disease

## 2021-01-28 ENCOUNTER — Emergency Department: Payer: Medicaid Other

## 2021-01-28 ENCOUNTER — Other Ambulatory Visit: Payer: Self-pay

## 2021-01-28 ENCOUNTER — Encounter: Payer: Self-pay | Admitting: Emergency Medicine

## 2021-01-28 ENCOUNTER — Emergency Department
Admission: EM | Admit: 2021-01-28 | Discharge: 2021-01-28 | Disposition: A | Discharge status: Home / Self Care | Payer: Medicaid Other | Attending: Emergency Medicine | Admitting: Emergency Medicine

## 2021-01-28 DIAGNOSIS — H1031 Unspecified acute conjunctivitis, right eye: Secondary | ICD-10-CM | POA: Insufficient documentation

## 2021-01-28 DIAGNOSIS — R079 Chest pain, unspecified: Secondary | ICD-10-CM

## 2021-01-28 DIAGNOSIS — F17210 Nicotine dependence, cigarettes, uncomplicated: Secondary | ICD-10-CM | POA: Insufficient documentation

## 2021-01-28 DIAGNOSIS — G44019 Episodic cluster headache, not intractable: Secondary | ICD-10-CM | POA: Insufficient documentation

## 2021-01-28 DIAGNOSIS — R072 Precordial pain: Secondary | ICD-10-CM | POA: Insufficient documentation

## 2021-01-28 LAB — COMPREHENSIVE METABOLIC PANEL
ALT: 63 U/L — ABNORMAL HIGH (ref 0–44)
AST: 34 U/L (ref 15–41)
Albumin: 4.1 g/dL (ref 3.5–5.0)
Alkaline Phosphatase: 60 U/L (ref 38–126)
Anion gap: 5 (ref 5–15)
BUN: 13 mg/dL (ref 6–20)
CO2: 27 mmol/L (ref 22–32)
Calcium: 8.9 mg/dL (ref 8.9–10.3)
Chloride: 105 mmol/L (ref 98–111)
Creatinine, Ser: 0.93 mg/dL (ref 0.61–1.24)
GFR, Estimated: >60 mL/min (ref >60)
Glucose, Bld: 101 mg/dL — ABNORMAL HIGH (ref 70–99)
Potassium: 4.1 mmol/L (ref 3.5–5.1)
Sodium: 137 mmol/L (ref 135–145)
Total Bilirubin: 0.4 mg/dL (ref 0.3–1.2)
Total Protein: 7.3 g/dL (ref 6.5–8.1)

## 2021-01-28 LAB — CBC WITH DIFFERENTIAL/PLATELET
Abs Immature Granulocytes: 0.02 10*3/uL (ref 0.00–0.07)
Basophils Absolute: 0.1 10*3/uL (ref 0.0–0.1)
Basophils Relative: 1 %
Eosinophils Absolute: 0.3 10*3/uL (ref 0.0–0.5)
Eosinophils Relative: 3 %
HCT: 45.1 % (ref 39.0–52.0)
Hemoglobin: 15.3 g/dL (ref 13.0–17.0)
Immature Granulocytes: 0 %
Lymphocytes Relative: 50 %
Lymphs Abs: 4.2 10*3/uL — ABNORMAL HIGH (ref 0.7–4.0)
MCH: 29 pg (ref 26.0–34.0)
MCHC: 33.9 g/dL (ref 30.0–36.0)
MCV: 85.4 fL (ref 80.0–100.0)
Monocytes Absolute: 0.5 10*3/uL (ref 0.1–1.0)
Monocytes Relative: 6 %
Neutro Abs: 3.3 10*3/uL (ref 1.7–7.7)
Neutrophils Relative %: 40 %
Platelets: 242 10*3/uL (ref 150–400)
RBC: 5.28 MIL/uL (ref 4.22–5.81)
RDW: 12.4 % (ref 11.5–15.5)
WBC: 8.3 10*3/uL (ref 4.0–10.5)
nRBC: 0 % (ref 0.0–0.2)

## 2021-01-28 LAB — TROPONIN I (HIGH SENSITIVITY): Troponin I (High Sensitivity): 4 ng/L (ref <18)

## 2021-01-28 MED ORDER — FAMOTIDINE 40 MG PO TABS: 40.0000 mg | ORAL_TABLET | Freq: Every evening | ORAL | 1 refills | Status: DC

## 2021-01-28 MED ORDER — POLYMYXIN B-TRIMETHOPRIM 10000-0.1 UNIT/ML-% OP SOLN: 1.0000 [drp] | OPHTHALMIC | 0 refills | Status: DC

## 2021-01-28 NOTE — ED Notes (Signed)
See triage note  presents with several sxs' over the past week  states he has had drainage from eye  then h/a with slight cough  but then developed intermittent chest pain over the past couple of days

## 2021-01-28 NOTE — ED Triage Notes (Signed)
Patient ambulatory to triage with steady gait, without difficulty or distress noted; pt reports upper CP x 4 hrs accomp by [facility]; pt with hx of same

## 2021-01-28 NOTE — ED Provider Notes (Signed)
[facility] Emergency Department Provider Note   ____________________________________________   Event Date/Time   First MD Initiated Contact with Patient 01/28/21 0813     (approximate)  I have reviewed the triage vital signs and the nursing notes.   HISTORY  Chief Complaint Chest Pain    HPI Patrick Huffman is a 30 y.o. male who presents for midsternal chest pain  LOCATION: Midsternal chest DURATION: 3 weeks prior to arrival TIMING: Intermittent and worsening overnight into today SEVERITY: Severe QUALITY: Sharp burning pain CONTEXT: Patient states that he has had intermittent substernal and left-sided chest pain over the past 3 weeks that is worse when he is laying flat and the worst episode being last night into today MODIFYING FACTORS: Worse laying flat and partially by sitting up and walking around ASSOCIATED SYMPTOMS: Denies   Per medical record review: Patient has family history of MI with recent negative stress test last month          Past Medical History:  Diagnosis Date   Back pain    Heart murmur    Hernia, inguinal     Patient Active Problem List   Diagnosis Date Noted   Heart palpitations 11/06/2019   Chest pain 09/11/2013   Dyspnea on exertion 09/11/2013    History reviewed. No pertinent surgical history.  Prior to Admission medications   Medication Sig Start Date End Date Taking? Authorizing Provider  famotidine (PEPCID) 40 MG tablet Take 1 tablet (40 mg total) by mouth every evening. 01/28/21 01/28/22 Yes Maddax Palinkas, Clent Jacks, MD  trimethoprim-polymyxin b (POLYTRIM) ophthalmic solution Place 1 drop into the right eye every 4 (four) hours. 01/28/21  Yes Merwyn Katos, MD  albuterol (VENTOLIN HFA) 108 (90 Base) MCG/ACT inhaler Inhale 2 puffs into the lungs every 6 (six) hours as needed for wheezing or shortness of breath. 04/14/20   Lucy Chris, PA  alum & mag hydroxide-simeth (MAALOX MAX) 400-400-40 MG/5ML  suspension Take 5 mLs by mouth every 6 (six) hours as needed for indigestion. 04/02/20   Nita Sickle, MD  cyclobenzaprine (FLEXERIL) 5 MG tablet 1 tablet every 8 hours as needed for muscle spasms 03/05/20   Irean Hong, MD  pantoprazole (PROTONIX) 40 MG tablet Take 1 tablet (40 mg total) by mouth daily. 04/02/20 04/02/21  Nita Sickle, MD  cetirizine (ZYRTEC) 10 MG tablet Take 1 tablet (10 mg total) by mouth daily. Patient not taking: Reported on 11/26/2018 01/27/16 10/05/19  Cuthriell, Delorise Royals, PA-C  dicyclomine (BENTYL) 20 MG tablet Take 1 tablet (20 mg total) by mouth 3 (three) times daily as needed for spasms. 04/12/19 10/05/19  Minna Antis, MD  loratadine (CLARITIN) 10 MG tablet Take 1 tablet (10 mg total) by mouth daily for 14 days. 09/13/18 10/05/19  Shaune Pollack, MD  promethazine (PHENERGAN) 25 MG tablet Take 1 tablet (25 mg total) by mouth every 6 (six) hours as needed for nausea or vomiting. Patient not taking: Reported on 11/26/2018 05/07/16 10/05/19  Evangeline Dakin, PA-C    Allergies No known allergies  No family history on file.  Social History Social History   Tobacco Use   Smoking status: Every Day    Packs/day: 0.50    Types: Cigarettes   Smokeless tobacco: Never  Vaping Use   Vaping Use: Never used  Substance Use Topics   Alcohol use: Not Currently   Drug use: No    Review of Systems Constitutional: No fever/chills Eyes: No visual changes. ENT: No sore  throat. Cardiovascular: Endorses chest pain. Respiratory: Denies shortness of breath. Gastrointestinal: No abdominal pain.  No nausea, no vomiting.  No diarrhea. Genitourinary: Negative for dysuria. Musculoskeletal: Negative for acute arthralgias Skin: Negative for rash. Neurological: Negative for headaches, weakness/numbness/paresthesias in any extremity Psychiatric: Negative for suicidal ideation/homicidal ideation   ____________________________________________   PHYSICAL  EXAM:  VITAL SIGNS: ED Triage Vitals  Enc Vitals Group     BP 01/28/21 0629 119/71     Pulse Rate 01/28/21 0629 68     Resp 01/28/21 0629 18     Temp 01/28/21 0629 97.9 F (36.6 C)     Temp Source 01/28/21 0629 Oral     SpO2 01/28/21 0629 97 %     Weight 01/28/21 0625 200 lb (90.7 kg)     Height 01/28/21 0625 6' (1.829 m)     Head Circumference --      Peak Flow --      Pain Score 01/28/21 0625 7     Pain Loc --      Pain Edu? --      Excl. in GC? --    Constitutional: Alert and oriented. Well appearing and in no acute distress. Eyes: Conjunctivae are normal. PERRL. Head: Atraumatic. Nose: No congestion/rhinnorhea. Mouth/Throat: Mucous membranes are moist. Neck: No stridor Cardiovascular: Grossly normal heart sounds.  Good peripheral circulation. Respiratory: Normal respiratory effort.  No retractions. Gastrointestinal: Soft and nontender. No distention. Musculoskeletal: No obvious deformities Neurologic:  Normal speech and language. No gross focal neurologic deficits are appreciated. Skin:  Skin is warm and dry. No rash noted. Psychiatric: Mood and affect are normal. Speech and behavior are normal.  ____________________________________________   LABS (all labs ordered are listed, but only abnormal results are displayed)  Labs Reviewed  CBC WITH DIFFERENTIAL/PLATELET - Abnormal; Notable for the following components:      Result Value   Lymphs Abs 4.2 (*)    All other components within normal limits  COMPREHENSIVE METABOLIC PANEL - Abnormal; Notable for the following components:   Glucose, Bld 101 (*)    ALT 63 (*)    All other components within normal limits  TROPONIN I (HIGH SENSITIVITY)  TROPONIN I (HIGH SENSITIVITY)   ____________________________________________  EKG  ED ECG REPORT I, Merwyn Katos, the attending physician, personally viewed and interpreted this ECG.  Date: 01/28/2021 EKG Time: 0626 Rate: 58 Rhythm: Bradycardic sinus rhythm QRS Axis:  normal Intervals: normal ST/T Wave abnormalities: normal Narrative Interpretation: Bradycardic sinus rhythm.  No evidence of acute ischemia  ____________________________________________  RADIOLOGY  ED MD interpretation: 2 view chest x-ray shows no evidence of acute abnormalities including no pneumonia, pneumothorax, or widened mediastinum  Official radiology report(s): DG Chest 2 View  Result Date: 01/28/2021 CLINICAL DATA:  Pt complains of left upper chest pain and SOB x 4 hours. Shielded. EXAM: CHEST - 2 VIEW COMPARISON:  11/29/2020 FINDINGS: Lungs are clear. Heart size and mediastinal contours are within normal limits. No effusion. Visualized bones unremarkable. IMPRESSION: No acute cardiopulmonary disease. Electronically Signed   By: Corlis Leak M.D.   On: 01/28/2021 07:15    ____________________________________________   PROCEDURES  Procedure(s) performed (including Critical Care):  .1-3 Lead EKG Interpretation Performed by: Merwyn Katos, MD Authorized by: Merwyn Katos, MD     Interpretation: normal     ECG rate:  69   ECG rate assessment: normal     Rhythm: sinus rhythm     Ectopy: none     Conduction: normal  ____________________________________________   INITIAL IMPRESSION / ASSESSMENT AND PLAN / ED COURSE  As part of my medical decision making, I reviewed the following data within the electronic medical record, if available:  Nursing notes reviewed and incorporated, Labs reviewed, EKG interpreted, Old chart reviewed, Radiograph reviewed and Notes from prior ED visits reviewed and incorporated        Workup: ECG, CXR, CBC, BMP, Troponin Findings: ECG: No overt evidence of STEMI. No evidence of Brugadas sign, delta wave, epsilon wave, significantly prolonged QTc, or malignant arrhythmia HS Troponin: Negative x1 Other Labs unremarkable for emergent problems. CXR: Without PTX, PNA, or widened mediastinum Last Stress Test: 2 months prior to  arrival Last Heart Catheterization: Never HEART Score: 2  Given History, Exam, and Workup I have low suspicion for ACS, Pneumothorax, Pneumonia, Pulmonary Embolus, Tamponade, Aortic Dissection or other emergent problem as a cause for this presentation.   Reassesment: Prior to discharge patients pain was controlled and they were well appearing.  Disposition:  Discharge. Strict return precautions discussed with patient with full understanding. Advised patient to follow up promptly with primary care provider       ____________________________________________   FINAL CLINICAL IMPRESSION(S) / ED DIAGNOSES  Final diagnoses:  Chest pain, unspecified type  Acute conjunctivitis of right eye, unspecified acute conjunctivitis type  Episodic cluster headache, not intractable     ED Discharge Orders          Ordered    trimethoprim-polymyxin b (POLYTRIM) ophthalmic solution  Every 4 hours        01/28/21 0911    famotidine (PEPCID) 40 MG tablet  Every evening        01/28/21 0911             Note:  This document was prepared using Dragon voice recognition software and may include unintentional dictation errors.    Merwyn Katos, MD 01/28/21 1426

## 2021-01-31 ENCOUNTER — Ambulatory Visit: Payer: Self-pay | Admitting: Cardiology

## 2021-03-10 ENCOUNTER — Emergency Department
Admission: EM | Admit: 2021-03-10 | Discharge: 2021-03-10 | Disposition: A | Discharge status: Home / Self Care | Payer: Medicaid Other | Attending: Emergency Medicine | Admitting: Emergency Medicine

## 2021-03-10 ENCOUNTER — Emergency Department: Payer: Medicaid Other

## 2021-03-10 ENCOUNTER — Other Ambulatory Visit: Payer: Self-pay

## 2021-03-10 ENCOUNTER — Encounter: Payer: Self-pay | Admitting: Emergency Medicine

## 2021-03-10 DIAGNOSIS — Z20822 Contact with and (suspected) exposure to covid-19: Secondary | ICD-10-CM | POA: Insufficient documentation

## 2021-03-10 DIAGNOSIS — F17210 Nicotine dependence, cigarettes, uncomplicated: Secondary | ICD-10-CM | POA: Insufficient documentation

## 2021-03-10 DIAGNOSIS — J101 Influenza due to other identified influenza virus with other respiratory manifestations: Secondary | ICD-10-CM | POA: Insufficient documentation

## 2021-03-10 DIAGNOSIS — J111 Influenza due to unidentified influenza virus with other respiratory manifestations: Secondary | ICD-10-CM

## 2021-03-10 LAB — RESP PANEL BY RT-PCR (FLU A&B, COVID) ARPGX2
Influenza A by PCR: POSITIVE — AB
Influenza B by PCR: NEGATIVE
SARS Coronavirus 2 by RT PCR: NEGATIVE

## 2021-03-10 MED ORDER — PREDNISONE 50 MG PO TABS: 50.0000 mg | ORAL_TABLET | Freq: Every day | ORAL | 0 refills | Status: DC

## 2021-03-10 MED ORDER — BENZONATATE 100 MG PO CAPS: 100.0000 mg | ORAL_CAPSULE | Freq: Three times a day (TID) | ORAL | 0 refills | Status: DC | PRN

## 2021-03-10 MED ORDER — PSEUDOEPH-BROMPHEN-DM 30-2-10 MG/5ML PO SYRP: 10.0000 mL | ORAL_SOLUTION | Freq: Four times a day (QID) | ORAL | 0 refills | Status: DC | PRN

## 2021-03-10 NOTE — ED Triage Notes (Signed)
Pt via POV from home. Pt c/o cough, nasal congestion, body aches for the past 2 days. Denies any fever. Pt is A&Ox4 and NAD. Pt's kids were recently dx with Flu.

## 2021-03-10 NOTE — ED Provider Notes (Signed)
[facility] Emergency Department Provider Note  ____________________________________________  Time seen: Approximately 5:55 PM  I have reviewed the triage vital signs and the nursing notes.   HISTORY  Chief Complaint Sore Throat    HPI Patrick Huffman is a 30 y.o. male who presents the emergency department complaining of multiple URI symptoms.  Patient's 2 kids tested positive for flu and he has now developed symptoms.  No difficulty breathing.  No fevers but patient has had chills, body aches, congestion, coughing.  No emesis or diarrhea.       Past Medical History:  Diagnosis Date   Back pain    Heart murmur    Hernia, inguinal     Patient Active Problem List   Diagnosis Date Noted   Heart palpitations 11/06/2019   Chest pain 09/11/2013   Dyspnea on exertion 09/11/2013    History reviewed. No pertinent surgical history.  Prior to Admission medications   Medication Sig Start Date End Date Taking? Authorizing Provider  benzonatate (TESSALON PERLES) 100 MG capsule Take 1 capsule (100 mg total) by mouth 3 (three) times daily as needed for cough. 03/10/21 03/10/22 Yes Damarious Holtsclaw, Delorise Royals, PA-C  brompheniramine-pseudoephedrine-DM 30-2-10 MG/5ML syrup Take 10 mLs by mouth 4 (four) times daily as needed. 03/10/21  Yes Ruari Mudgett, Delorise Royals, PA-C  predniSONE (DELTASONE) 50 MG tablet Take 1 tablet (50 mg total) by mouth daily with breakfast. 03/10/21  Yes Ladanian Kelter, Delorise Royals, PA-C  albuterol (VENTOLIN HFA) 108 (90 Base) MCG/ACT inhaler Inhale 2 puffs into the lungs every 6 (six) hours as needed for wheezing or shortness of breath. 04/14/20   Lucy Chris, PA  alum & mag hydroxide-simeth (MAALOX MAX) 400-400-40 MG/5ML suspension Take 5 mLs by mouth every 6 (six) hours as needed for indigestion. 04/02/20   Nita Sickle, MD  cyclobenzaprine (FLEXERIL) 5 MG tablet 1 tablet every 8 hours as needed for muscle spasms 03/05/20   Irean Hong, MD   famotidine (PEPCID) 40 MG tablet Take 1 tablet (40 mg total) by mouth every evening. 01/28/21 01/28/22  Merwyn Katos, MD  pantoprazole (PROTONIX) 40 MG tablet Take 1 tablet (40 mg total) by mouth daily. 04/02/20 04/02/21  Nita Sickle, MD  trimethoprim-polymyxin b [facility]) ophthalmic solution Place 1 drop into the right eye every 4 (four) hours. 01/28/21   Merwyn Katos, MD  cetirizine (ZYRTEC) 10 MG tablet Take 1 tablet (10 mg total) by mouth daily. Patient not taking: Reported on 11/26/2018 01/27/16 10/05/19  Shatarra Wehling, Delorise Royals, PA-C  dicyclomine (BENTYL) 20 MG tablet Take 1 tablet (20 mg total) by mouth 3 (three) times daily as needed for spasms. 04/12/19 10/05/19  Minna Antis, MD  loratadine (CLARITIN) 10 MG tablet Take 1 tablet (10 mg total) by mouth daily for 14 days. 09/13/18 10/05/19  Shaune Pollack, MD  promethazine (PHENERGAN) 25 MG tablet Take 1 tablet (25 mg total) by mouth every 6 (six) hours as needed for nausea or vomiting. Patient not taking: Reported on 11/26/2018 05/07/16 10/05/19  Evangeline Dakin, PA-C    Allergies No known allergies  History reviewed. No pertinent family history.  Social History Social History   Tobacco Use   Smoking status: Every Day    Packs/day: 0.50    Types: Cigarettes   Smokeless tobacco: Never  Vaping Use   Vaping Use: Never used  Substance Use Topics   Alcohol use: Not Currently   Drug use: No     Review of Systems  Constitutional: No  fever/positive chills Eyes: No visual changes. No discharge ENT: Positive for congestion and sore throat Cardiovascular: no chest pain. Respiratory: Positive cough. No SOB. Gastrointestinal: No abdominal pain.  No nausea, no vomiting.  No diarrhea.  No constipation. Musculoskeletal: Negative for musculoskeletal pain. Skin: Negative for rash, abrasions, lacerations, ecchymosis. Neurological: Negative for headaches, focal weakness or numbness.  10 System ROS otherwise  negative.  ____________________________________________   PHYSICAL EXAM:  VITAL SIGNS: ED Triage Vitals  Enc Vitals Group     BP 03/10/21 1651 125/79     Pulse Rate 03/10/21 1651 85     Resp 03/10/21 1651 20     Temp 03/10/21 1651 98.1 F (36.7 C)     Temp Source 03/10/21 1651 Oral     SpO2 03/10/21 1651 97 %     Weight 03/10/21 1649 200 lb (90.7 kg)     Height 03/10/21 1649 6' (1.829 m)     Head Circumference --      Peak Flow --      Pain Score 03/10/21 1649 5     Pain Loc --      Pain Edu? --      Excl. in GC? --      Constitutional: Alert and oriented. Well appearing and in no acute distress. Eyes: Conjunctivae are normal. PERRL. EOMI. Head: Atraumatic. ENT:      Ears:       Nose: No congestion/rhinnorhea.      Mouth/Throat: Mucous membranes are moist.  Neck: No stridor.    Cardiovascular: Normal rate, regular rhythm. Normal S1 and S2.  Good peripheral circulation. Respiratory: Normal respiratory effort without tachypnea or retractions. Lungs CTAB. Good air entry to the bases with no decreased or absent breath sounds. Gastrointestinal: Bowel sounds 4 quadrants. Soft and nontender to palpation. No guarding or rigidity. No palpable masses. No distention. No CVA tenderness. Musculoskeletal: Full range of motion to all extremities. No gross deformities appreciated. Neurologic:  Normal speech and language. No gross focal neurologic deficits are appreciated.  Skin:  Skin is warm, dry and intact. No rash noted. Psychiatric: Mood and affect are normal. Speech and behavior are normal. Patient exhibits appropriate insight and judgement.   ____________________________________________   LABS (all labs ordered are listed, but only abnormal results are displayed)  Labs Reviewed  RESP PANEL BY RT-PCR (FLU A&B, COVID) [id number]   ____________________________________________  EKG   ____________________________________________  RADIOLOGY I personally viewed and evaluated  these images as part of my medical decision making, as well as reviewing the written report by the radiologist.  ED Provider Interpretation: No consolidations on chest x-ray concerning for pneumonia  DG Chest 2 View  Result Date: 03/10/2021 CLINICAL DATA:  Fever cough EXAM: CHEST - 2 VIEW COMPARISON:  01/28/2021 FINDINGS: The heart size and mediastinal contours are within normal limits. Both lungs are clear. The visualized skeletal structures are unremarkable. IMPRESSION: No active cardiopulmonary disease. Electronically Signed   By: Jasmine Pang M.D.   On: 03/10/2021 18:24    ____________________________________________    PROCEDURES  Procedure(s) performed:    Procedures    Medications - No data to display   ____________________________________________   INITIAL IMPRESSION / ASSESSMENT AND PLAN / ED COURSE  Pertinent labs & imaging results that were available during my care of the patient were reviewed by me and considered in my medical decision making (see chart for details).  Review of the [place] CSRS was performed in accordance of the [facility] prior to dispensing any controlled drugs.  Patient's diagnosis is consistent with influenza.  Patient presents emergency department flulike symptoms.  Patient's 2 kids have flu, but patient wanted to ensure that he had no pneumonia as he is a smoker and has had a bad cough.  Chest x-ray is reassuring at this time.  I will write symptom control medications for the patient.  Follow-up primary care as needed..  Patient is given ED precautions to return to the ED for any worsening or new symptoms.     ____________________________________________  FINAL CLINICAL IMPRESSION(S) / ED DIAGNOSES  Final diagnoses:  Influenza      NEW MEDICATIONS STARTED DURING THIS VISIT:  ED Discharge Orders          Ordered    brompheniramine-pseudoephedrine-DM 30-2-10 MG/5ML syrup  4 times daily PRN        03/10/21 1859    benzonatate  (TESSALON PERLES) 100 MG capsule  3 times daily PRN        03/10/21 1859    predniSONE (DELTASONE) 50 MG tablet  Daily with breakfast        03/10/21 1859                This chart was dictated using voice recognition software/Dragon. Despite best efforts to proofread, errors can occur which can change the meaning. Any change was purely unintentional.    Matthewjames, Petrasek, PA-C 03/10/21 1859    Merwyn Katos, MD 03/11/21 [phone]

## 2021-03-19 ENCOUNTER — Emergency Department: Payer: Medicaid Other

## 2021-03-19 ENCOUNTER — Emergency Department
Admission: EM | Admit: 2021-03-19 | Discharge: 2021-03-19 | Disposition: A | Discharge status: Home / Self Care | Payer: Medicaid Other | Attending: Emergency Medicine | Admitting: Emergency Medicine

## 2021-03-19 ENCOUNTER — Encounter: Payer: Self-pay | Admitting: Emergency Medicine

## 2021-03-19 DIAGNOSIS — F17210 Nicotine dependence, cigarettes, uncomplicated: Secondary | ICD-10-CM | POA: Insufficient documentation

## 2021-03-19 DIAGNOSIS — R0789 Other chest pain: Secondary | ICD-10-CM | POA: Insufficient documentation

## 2021-03-19 DIAGNOSIS — R519 Headache, unspecified: Secondary | ICD-10-CM | POA: Insufficient documentation

## 2021-03-19 LAB — CBC
HCT: 45.5 % (ref 39.0–52.0)
Hemoglobin: 15.1 g/dL (ref 13.0–17.0)
MCH: 28.7 pg (ref 26.0–34.0)
MCHC: 33.2 g/dL (ref 30.0–36.0)
MCV: 86.3 fL (ref 80.0–100.0)
Platelets: 267 10*3/uL (ref 150–400)
RBC: 5.27 MIL/uL (ref 4.22–5.81)
RDW: 12.2 % (ref 11.5–15.5)
WBC: 7.9 10*3/uL (ref 4.0–10.5)
nRBC: 0 % (ref 0.0–0.2)

## 2021-03-19 LAB — BASIC METABOLIC PANEL
Anion gap: 6 (ref 5–15)
BUN: 15 mg/dL (ref 6–20)
CO2: 25 mmol/L (ref 22–32)
Calcium: 8.8 mg/dL — ABNORMAL LOW (ref 8.9–10.3)
Chloride: 107 mmol/L (ref 98–111)
Creatinine, Ser: 0.88 mg/dL (ref 0.61–1.24)
GFR, Estimated: >60 mL/min (ref >60)
Glucose, Bld: 106 mg/dL — ABNORMAL HIGH (ref 70–99)
Potassium: 4 mmol/L (ref 3.5–5.1)
Sodium: 138 mmol/L (ref 135–145)

## 2021-03-19 LAB — TROPONIN I (HIGH SENSITIVITY)
Troponin I (High Sensitivity): 3 ng/L (ref <18)
Troponin I (High Sensitivity): 3 ng/L (ref <18)

## 2021-03-19 LAB — D-DIMER, QUANTITATIVE: D-Dimer, Quant: <0.27 ug/mL-FEU (ref 0.00–0.50)

## 2021-03-19 LAB — SEDIMENTATION RATE: Sed Rate: 21 mm/hr — ABNORMAL HIGH (ref 0–15)

## 2021-03-19 LAB — C-REACTIVE PROTEIN: CRP: 1.4 mg/dL — ABNORMAL HIGH (ref <1.0)

## 2021-03-19 MED ORDER — KETOROLAC TROMETHAMINE 30 MG/ML IJ SOLN
30.0000 mg | Freq: Once | INTRAMUSCULAR | Status: AC
  Administered 2021-03-19: 30 mg via INTRAVENOUS
  Filled 2021-03-19: qty 1

## 2021-03-19 MED ORDER — IOHEXOL 350 MG/ML SOLN
75.0000 mL | Freq: Once | INTRAVENOUS | Status: AC | PRN
  Administered 2021-03-19: 75 mL via INTRAVENOUS

## 2021-03-19 NOTE — ED Provider Notes (Signed)
[facility] Emergency Department Provider Note  ____________________________________________   Event Date/Time   First MD Initiated Contact with Patient 03/19/21 [phone]     (approximate)  I have reviewed the triage vital signs and the nursing notes.   HISTORY  Chief Complaint Chest Pain and Headache    HPI Patrick Huffman is a 30 y.o. male with no significant past medical history who presents to the emergency department with complaints of left-sided headache and pain with palpation over the left temporal area that started 2 days ago after he cracked his neck.  He denies any head injury.  No associated numbness, tingling, weakness until tonight when he started having some central chest pressure and felt numbness down the left arm and pain that radiated into his jaw.  He states he did have some associated shortness of breath.  Recently had influenza about 2 weeks ago.  No further fevers or cough.  Denies vomiting or diarrhea.  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  He denies that this was a superficial thrombophlebitis and states he was on anticoagulants but is no longer on this medication.  No history of PE.  States he has had some blurry vision that has resolved.  No diplopia or vision loss.        Past Medical History:  Diagnosis Date   Back pain    Heart murmur    Hernia, inguinal     Patient Active Problem List   Diagnosis Date Noted   Heart palpitations 11/06/2019   Chest pain 09/11/2013   Dyspnea on exertion 09/11/2013    History reviewed. No pertinent surgical history.  Prior to Admission medications   Medication Sig Start Date End Date Taking? Authorizing Provider  albuterol (VENTOLIN HFA) 108 (90 Base) MCG/ACT inhaler Inhale 2 puffs into the lungs every 6 (six) hours as needed for wheezing or shortness of breath. 04/14/20   Marlana Salvage, PA  alum & mag hydroxide-simeth (MAALOX MAX)  400-400-40 MG/5ML suspension Take 5 mLs by mouth every 6 (six) hours as needed for indigestion. 04/02/20   Rudene Re, MD  benzonatate (TESSALON PERLES) 100 MG capsule Take 1 capsule (100 mg total) by mouth 3 (three) times daily as needed for cough. 03/10/21 03/10/22  Cuthriell, Charline Bills, PA-C  brompheniramine-pseudoephedrine-DM 30-2-10 MG/5ML syrup Take 10 mLs by mouth 4 (four) times daily as needed. 03/10/21   Cuthriell, Charline Bills, PA-C  cyclobenzaprine (FLEXERIL) 5 MG tablet 1 tablet every 8 hours as needed for muscle spasms 03/05/20   Paulette Blanch, MD  famotidine (PEPCID) 40 MG tablet Take 1 tablet (40 mg total) by mouth every evening. 01/28/21 01/28/22  Naaman Plummer, MD  pantoprazole (PROTONIX) 40 MG tablet Take 1 tablet (40 mg total) by mouth daily. 04/02/20 04/02/21  Rudene Re, MD  predniSONE (DELTASONE) 50 MG tablet Take 1 tablet (50 mg total) by mouth daily with breakfast. 03/10/21   Cuthriell, Charline Bills, PA-C  trimethoprim-polymyxin b (POLYTRIM) ophthalmic solution Place 1 drop into the right eye every 4 (four) hours. 01/28/21   Naaman Plummer, MD  cetirizine (ZYRTEC) 10 MG tablet Take 1 tablet (10 mg total) by mouth daily. Patient not taking: Reported on 11/26/2018 01/27/16 10/05/19  Cuthriell, Charline Bills, PA-C  dicyclomine (BENTYL) 20 MG tablet Take 1 tablet (20 mg total) by mouth 3 (three) times daily as needed for spasms. 04/12/19 10/05/19  Harvest Dark, MD  loratadine (  CLARITIN) 10 MG tablet Take 1 tablet (10 mg total) by mouth daily for 14 days. 09/13/18 10/05/19  Duffy Bruce, MD  promethazine (PHENERGAN) 25 MG tablet Take 1 tablet (25 mg total) by mouth every 6 (six) hours as needed for nausea or vomiting. Patient not taking: Reported on 11/26/2018 05/07/16 10/05/19  Arlyss Repress, PA-C    Allergies No known allergies  No family history on file.  Social History Social History   Tobacco Use   Smoking status: Every Day    Packs/day: 0.50     Types: Cigarettes   Smokeless tobacco: Never  Vaping Use   Vaping Use: Never used  Substance Use Topics   Alcohol use: Not Currently   Drug use: No    Review of Systems Constitutional: No fever. Eyes: No visual changes. ENT: No sore throat. Cardiovascular:+chest pain. Respiratory: + shortness of breath. Gastrointestinal: No nausea, vomiting, diarrhea. Genitourinary: Negative for dysuria. Musculoskeletal: Negative for back pain. Skin: Negative for rash. Neurological: Negative for focal weakness.  + left arm numbness.  ____________________________________________   PHYSICAL EXAM:  VITAL SIGNS: ED Triage Vitals [03/19/21 0056]  Enc Vitals Group     BP 118/81     Pulse Rate 96     Resp 20     Temp 97.8 F (36.6 C)     Temp Source Oral     SpO2 96 %     Weight 200 lb (90.7 kg)     Height      Head Circumference      Peak Flow      Pain Score      Pain Loc      Pain Edu?      Excl. in [place]?    CONSTITUTIONAL: Alert and oriented and responds appropriately to questions. Well-appearing; well-nourished HEAD: Normocephalic, atraumatic, tender to palpation over the left scalp and temple area without redness, warmth EYES: Conjunctivae clear, pupils appear equal, EOM appear intact ENT: normal nose; moist mucous membranes, tender to palpation over the left jaw without redness, warmth, swelling.  No dental caries or dental abscess.  Tongue sits flat in the bottom of the mouth.  No pharyngeal erythema, petechiae.  No tonsillar hypertrophy, exudate or uvular swelling or deviation. NECK: Supple, normal ROM, no meningismus CARD: RRR; S1 and S2 appreciated; no murmurs, no clicks, no rubs, no gallops CHEST:  Chest wall is tender to palpation over the mid sternum.  No crepitus, ecchymosis, erythema, warmth, rash or other lesions present.   RESP: Normal chest excursion without splinting or tachypnea; breath sounds clear and equal bilaterally; no wheezes, no rhonchi, no rales, no hypoxia or  respiratory distress, speaking full sentences ABD/GI: Normal bowel sounds; non-distended; soft, non-tender, no rebound, no guarding, no peritoneal signs, no hepatosplenomegaly BACK: The back appears normal EXT: Normal ROM in all joints; no deformity noted, no edema; no cyanosis, no calf tenderness or calf swelling SKIN: Normal color for age and race; warm; no rash on exposed skin NEURO: Moves all extremities equally, sensation to light touch intact diffusely, cranial nerves II through XII intact, normal speech, normal gait PSYCH: The patient's mood and manner are appropriate.  ____________________________________________   LABS (all labs ordered are listed, but only abnormal results are displayed)  Labs Reviewed  BASIC METABOLIC PANEL - Abnormal; Notable for the following components:      Result Value   Glucose, Bld 106 (*)    Calcium 8.8 (*)    All other components within normal limits  SEDIMENTATION  RATE - Abnormal; Notable for the following components:   Sed Rate 21 (*)    All other components within normal limits  CBC  D-DIMER, QUANTITATIVE  C-REACTIVE PROTEIN  TROPONIN I (HIGH SENSITIVITY)  TROPONIN I (HIGH SENSITIVITY)   ____________________________________________  EKG   EKG Interpretation  Date/Time:  Wednesday March 19 2021 00:47:52 EST Ventricular Rate:  82 PR Interval:  106 QRS Duration: 88 QT Interval:  348 QTC Calculation: 406 R Axis:   57 Text Interpretation: Sinus rhythm with short PR Otherwise normal ECG Confirmed by Pryor Curia [phone]) on 03/19/2021 5:04:38 AM        ____________________________________________  RADIOLOGY Jessie Foot Xian Apostol, personally viewed and evaluated these images (plain radiographs) as part of my medical decision making, as well as reviewing the written report by the radiologist.  ED MD interpretation: CTA head and neck showed no acute abnormality.  Chest x-ray clear.  Official radiology report(s): CT ANGIO HEAD NECK W WO  CM  Result Date: 03/19/2021 CLINICAL DATA:  Left temporal head pain.  Neck trauma. EXAM: CT ANGIOGRAPHY HEAD AND NECK TECHNIQUE: Multidetector CT imaging of the head and neck was performed using the standard protocol during bolus administration of intravenous contrast. Multiplanar CT image reconstructions and MIPs were obtained to evaluate the vascular anatomy. Carotid stenosis measurements (when applicable) are obtained utilizing NASCET criteria, using the distal internal carotid diameter as the denominator. CONTRAST:  21m OMNIPAQUE IOHEXOL 350 MG/ML SOLN COMPARISON:  07/09/2020 FINDINGS: CT HEAD FINDINGS Brain: There is no mass, hemorrhage or extra-axial collection. The size and configuration of the ventricles and extra-axial CSF spaces are normal. There is no acute or chronic infarction. The brain parenchyma is normal. Skull: The visualized skull base, calvarium and extracranial soft tissues are normal. Sinuses/Orbits: No fluid levels or advanced mucosal thickening of the visualized paranasal sinuses. No mastoid or middle ear effusion. The orbits are normal. CTA NECK FINDINGS SKELETON: There is no bony spinal canal stenosis. No lytic or blastic lesion. OTHER NECK: Normal pharynx, larynx and major salivary glands. No cervical lymphadenopathy. Unremarkable thyroid gland. Left facial inclusion cyst. UPPER CHEST: No pneumothorax or pleural effusion. No nodules or masses. AORTIC ARCH: There is no calcific atherosclerosis of the aortic arch. There is no aneurysm, dissection or hemodynamically significant stenosis of the visualized portion of the aorta. Conventional 3 vessel aortic branching pattern. The visualized proximal subclavian arteries are widely patent. RIGHT CAROTID SYSTEM: Normal without aneurysm, dissection or stenosis. LEFT CAROTID SYSTEM: Normal without aneurysm, dissection or stenosis. VERTEBRAL ARTERIES: Left dominant configuration. Both origins are clearly patent. There is no dissection, occlusion or  flow-limiting stenosis to the skull base (V1-V3 segments). CTA HEAD FINDINGS POSTERIOR CIRCULATION: --Vertebral arteries: Normal V4 segments. --Inferior cerebellar arteries: Normal. --Basilar artery: Normal. --Superior cerebellar arteries: Normal. --Posterior cerebral arteries (PCA): Normal. ANTERIOR CIRCULATION: --Intracranial internal carotid arteries: Normal. --Anterior cerebral arteries (ACA): Normal. Both A1 segments are present. Patent anterior communicating artery (a-comm). --Middle cerebral arteries (MCA): Normal. VENOUS SINUSES: As permitted by contrast timing, patent. ANATOMIC VARIANTS: None Review of the MIP images confirms the above findings. IMPRESSION: Normal CTA of the head and neck. Electronically Signed   By: KUlyses JarredM.D.   On: 03/19/2021 01:55   DG Chest 2 View  Result Date: 03/19/2021 CLINICAL DATA:  Chest pain EXAM: CHEST - 2 VIEW COMPARISON:  03/10/2021 FINDINGS: Check shadow is within normal limits. The lungs are well aerated bilaterally. No focal infiltrate or effusion is seen. No bony abnormality is noted. IMPRESSION: No active  cardiopulmonary disease. Electronically Signed   By: Inez Catalina M.D.   On: 03/19/2021 01:14    ____________________________________________   PROCEDURES  Procedure(s) performed (including Critical Care):  Procedures   ____________________________________________   INITIAL IMPRESSION / ASSESSMENT AND PLAN / ED COURSE  As part of my medical decision making, I reviewed the following data within the [facility] notes reviewed and incorporated, Labs reviewed , EKG interpreted , Old EKG reviewed, Old chart reviewed, Radiograph reviewed , CT reviewed, and Notes from prior ED visits         Patient here with complaints of left-sided headache that started after he popped his neck 2 days ago.  No current focal neurologic deficits.  CTA of his head and neck were obtained in triage given concerns for possible vascular  injury that show no acute abnormality.  No signs of CVA.  Doubt intracranial hemorrhage.  Seems like a very atypical presentation for temporal arteritis but he does have some tenderness to palpation over this area without redness, warmth or swelling.  Will obtain ESR and CRP.  He denies any vision changes currently.  No jaw claudication.  Seems also very atypical for trigeminal neuralgia.  Patient also complaining of chest pain.  This seems very atypical in nature and likely musculoskeletal but given his reported history of a DVT, will obtain D-dimer.  Troponin is negative.  EKG nonischemic.  Chest x-ray is clear showing no infiltrate, edema or pneumothorax.  Doubt dissection.  Doubt ACS.  ED PROGRESS  Patient's ESR is very minimally elevated.  CRP pending.  Awaiting D-dimer.  Patient reports his chest pain has resolved but still having headache.  Have offered further medications which she agrees to but he appears very anxious to leave.    Patient's D-dimer has come back negative making PE and cavernous sinus thrombosis much less likely.  Nursing staff reports now patient wants to be discharged without any further medication for his headache.  I was getting his discharge papers ready and of the nurse brought them to the patient and he did not wait to hear the rest of his testing results, plan of care, return precautions prior to walking out of the ED with his significant other.  He was given outpatient follow-up.  Recommended Tylenol, Motrin for pain control.   I reviewed all nursing notes and pertinent previous records as available.  I have reviewed and interpreted any EKGs, lab and urine results, imaging (as available).  ____________________________________________   FINAL CLINICAL IMPRESSION(S) / ED DIAGNOSES  Final diagnoses:  Left-sided headache  Atypical chest pain     ED Discharge Orders     None       *Please note:  MEMPHIS DECOTEAU was evaluated in Emergency Department on  03/19/2021 for the symptoms described in the history of present illness. He was evaluated in the context of the global COVID-19 pandemic, which necessitated consideration that the patient might be at risk for infection with the SARS-CoV-2 virus that causes COVID-19. Institutional protocols and algorithms that pertain to the evaluation of patients at risk for COVID-19 are in a state of rapid change based on information released by regulatory bodies including the CDC and federal and state organizations. These policies and algorithms were followed during the patient's care in the ED.  Some ED evaluations and interventions may be delayed as a result of limited staffing during and the pandemic.*   Note:  This document was prepared using Dragon voice recognition software and may  include unintentional dictation errors.    Thresia Ramanathan, Delice Bison, DO 03/19/21 [phone]

## 2021-03-19 NOTE — ED Notes (Signed)
ED Provider at bedside. 

## 2021-03-19 NOTE — Discharge Instructions (Signed)
You may alternate Tylenol 1000 mg every 6 hours as needed for pain, fever and Ibuprofen 800 mg every 8 hours as needed for pain, fever.  Please take Ibuprofen with food.  Do not take more than 4000 mg of Tylenol (acetaminophen) in a 24 hour period.   Steps to find a Primary Care Provider (PCP):  Call [phone] or [phone] to access "[facility] Find a Doctor Service."  2.  You may also go on the [facility] website at [email or web address]  

## 2021-03-19 NOTE — ED Notes (Addendum)
Pt wants to leave , reports feels better. No need for additional meds. Pt advised will have to sign AMA  as no disposition for dischgarge at this time. Pt informed MD will come back to discuss results with him pt. Pt states does not want to stay longer. ERMD made aware .  Pt has disposition for discharge.

## 2021-03-19 NOTE — ED Triage Notes (Signed)
Pt in with pain to L temporal head, tender to touch. States 2 days ago, he was popping neck and felt pop in L temporal area. Has been having intermittent blurred vision to L eye since. Pt also states he woke up with stabbing central cp that radiates to L arm and L jaw. Recently had flu, c/o some nausea and sob

## 2021-05-17 ENCOUNTER — Other Ambulatory Visit: Payer: Self-pay

## 2021-05-17 ENCOUNTER — Emergency Department: Payer: Medicaid Other

## 2021-05-17 ENCOUNTER — Emergency Department
Admission: EM | Admit: 2021-05-17 | Discharge: 2021-05-17 | Disposition: A | Discharge status: Home / Self Care | Payer: Medicaid Other | Attending: Emergency Medicine | Admitting: Emergency Medicine

## 2021-05-17 DIAGNOSIS — J40 Bronchitis, not specified as acute or chronic: Secondary | ICD-10-CM | POA: Insufficient documentation

## 2021-05-17 DIAGNOSIS — Z20822 Contact with and (suspected) exposure to covid-19: Secondary | ICD-10-CM | POA: Insufficient documentation

## 2021-05-17 LAB — CBC WITH DIFFERENTIAL/PLATELET
Abs Immature Granulocytes: 0.03 10*3/uL (ref 0.00–0.07)
Basophils Absolute: 0.1 10*3/uL (ref 0.0–0.1)
Basophils Relative: 1 %
Eosinophils Absolute: 0.2 10*3/uL (ref 0.0–0.5)
Eosinophils Relative: 2 %
HCT: 44.6 % (ref 39.0–52.0)
Hemoglobin: 14.5 g/dL (ref 13.0–17.0)
Immature Granulocytes: 0 %
Lymphocytes Relative: 41 %
Lymphs Abs: 4.1 10*3/uL — ABNORMAL HIGH (ref 0.7–4.0)
MCH: 29.1 pg (ref 26.0–34.0)
MCHC: 32.5 g/dL (ref 30.0–36.0)
MCV: 89.4 fL (ref 80.0–100.0)
Monocytes Absolute: 0.7 10*3/uL (ref 0.1–1.0)
Monocytes Relative: 7 %
Neutro Abs: 5 10*3/uL (ref 1.7–7.7)
Neutrophils Relative %: 49 %
Platelets: 243 10*3/uL (ref 150–400)
RBC: 4.99 MIL/uL (ref 4.22–5.81)
RDW: 12.8 % (ref 11.5–15.5)
WBC: 10.1 10*3/uL (ref 4.0–10.5)
nRBC: 0 % (ref 0.0–0.2)

## 2021-05-17 LAB — COMPREHENSIVE METABOLIC PANEL
ALT: 69 U/L — ABNORMAL HIGH (ref 0–44)
AST: 39 U/L (ref 15–41)
Albumin: 4.2 g/dL (ref 3.5–5.0)
Alkaline Phosphatase: 75 U/L (ref 38–126)
Anion gap: 7 (ref 5–15)
BUN: 15 mg/dL (ref 6–20)
CO2: 27 mmol/L (ref 22–32)
Calcium: 9.2 mg/dL (ref 8.9–10.3)
Chloride: 104 mmol/L (ref 98–111)
Creatinine, Ser: 1.11 mg/dL (ref 0.61–1.24)
GFR, Estimated: >60 mL/min (ref >60)
Glucose, Bld: 105 mg/dL — ABNORMAL HIGH (ref 70–99)
Potassium: 4 mmol/L (ref 3.5–5.1)
Sodium: 138 mmol/L (ref 135–145)
Total Bilirubin: 0.5 mg/dL (ref 0.3–1.2)
Total Protein: 7.5 g/dL (ref 6.5–8.1)

## 2021-05-17 LAB — RESP PANEL BY RT-PCR (FLU A&B, COVID) ARPGX2
Influenza A by PCR: NEGATIVE
Influenza B by PCR: NEGATIVE
SARS Coronavirus 2 by RT PCR: NEGATIVE

## 2021-05-17 MED ORDER — PREDNISONE 50 MG PO TABS: 50.0000 mg | ORAL_TABLET | Freq: Every day | ORAL | 0 refills | Status: DC

## 2021-05-17 MED ORDER — AZITHROMYCIN 250 MG PO TABS: ORAL_TABLET | ORAL | 0 refills | Status: AC

## 2021-05-17 NOTE — ED Provider Notes (Signed)
[facility] Provider Note    Event Date/Time   First MD Initiated Contact with Patient 05/17/21 0730     (approximate)   History   Cough   HPI  Patrick Huffman is a 31 y.o. male with past medical history of a heart murmur and an inguinal hernia who presents with complaints of cough.  Patient reports he had a flu about a month ago.  He reports he is had a cough since then which has not improved.  He reports is productive of green mucus.  Did notice a small amount of blood mixed with mucus today which concerned him.  Denies shortness of breath.     Physical Exam   Triage Vital Signs: ED Triage Vitals  Enc Vitals Group     BP 05/17/21 0546 (!) 126/93     Pulse Rate 05/17/21 0546 77     Resp 05/17/21 0546 18     Temp 05/17/21 0546 98.1 F (36.7 C)     Temp Source 05/17/21 0546 Oral     SpO2 05/17/21 0546 96 %     Weight 05/17/21 0546 81.6 kg (180 lb)     Height 05/17/21 0546 1.829 m (6')     Head Circumference --      Peak Flow --      Pain Score 05/17/21 0615 7     Pain Loc --      Pain Edu? --      Excl. in [place]? --     Most recent vital signs: Vitals:   05/17/21 0546  BP: (!) 126/93  Pulse: 77  Resp: 18  Temp: 98.1 F (36.7 C)  SpO2: 96%     General: Awake, no distress.  CV:  Good peripheral perfusion.  No tachycardia Resp:  Normal effort.  Clear to auscultation bilaterally Abd:  No distention.  Other:  No calf pain or swelling   ED Results / Procedures / Treatments   Labs (all labs ordered are listed, but only abnormal results are displayed) Labs Reviewed  CBC WITH DIFFERENTIAL/PLATELET - Abnormal; Notable for the following components:      Result Value   Lymphs Abs 4.1 (*)    All other components within normal limits  COMPREHENSIVE METABOLIC PANEL - Abnormal; Notable for the following components:   Glucose, Bld 105 (*)    ALT 69 (*)    All other components within normal limits  RESP PANEL BY RT-PCR (FLU A&B, COVID)  ARPGX2     EKG     RADIOLOGY Chest x-ray read by me, no acute infiltrate    PROCEDURES:  Critical Care performed:   Procedures   MEDICATIONS ORDERED IN ED: Medications - No data to display   IMPRESSION / MDM / ASSESSMENT AND PLAN / ED COURSE  I reviewed the triage vital signs and the nursing notes.  Patient presents with productive cough times several weeks, possibly blood-tinged.  Exam is overall reassuring, patient is well-appearing.  Lab work reviewed, no evidence of increased white blood cell count, normal BMP.  Will send for chest x-ray to rule out pneumonia, COVID and flu swab are negative  Chest x-ray is reassuring, consistent with bronchitis, will Rx prednisone Z-Pak        FINAL CLINICAL IMPRESSION(S) / ED DIAGNOSES   Final diagnoses:  Bronchitis     Rx / DC Orders   ED Discharge Orders          Ordered    predniSONE (DELTASONE)  50 MG tablet  Daily with breakfast        05/17/21 0813    azithromycin (ZITHROMAX Z-PAK) 250 MG tablet        05/17/21 0813             Note:  This document was prepared using Dragon voice recognition software and may include unintentional dictation errors.   Jene Every, MD 05/17/21 [phone]

## 2021-05-17 NOTE — ED Triage Notes (Addendum)
Pt presents tonight with complaints of coughing, he states that he has been coughing so bad that he has expelled blood and phlegm. He notes trying OTC medications without improvement. Pt believes the "red blood is from frequent coughin". Denies CP or SOB.

## 2021-05-17 NOTE — ED Notes (Signed)
Pt Aox4, walked independently out of unit

## 2021-05-17 NOTE — ED Notes (Signed)
Pt declined DC vitals at this time.  

## 2021-05-24 ENCOUNTER — Encounter: Payer: Self-pay | Admitting: Emergency Medicine

## 2021-05-24 ENCOUNTER — Emergency Department
Admission: EM | Admit: 2021-05-24 | Discharge: 2021-05-25 | Disposition: A | Discharge status: Home / Self Care | Payer: Self-pay | Attending: Emergency Medicine | Admitting: Emergency Medicine

## 2021-05-24 ENCOUNTER — Other Ambulatory Visit: Payer: Self-pay

## 2021-05-24 ENCOUNTER — Emergency Department: Payer: Self-pay

## 2021-05-24 ENCOUNTER — Emergency Department
Admission: EM | Admit: 2021-05-24 | Discharge: 2021-05-24 | Disposition: A | Discharge status: Home / Self Care | Payer: Medicaid Other | Attending: Emergency Medicine | Admitting: Emergency Medicine

## 2021-05-24 DIAGNOSIS — R519 Headache, unspecified: Secondary | ICD-10-CM | POA: Insufficient documentation

## 2021-05-24 DIAGNOSIS — Z87891 Personal history of nicotine dependence: Secondary | ICD-10-CM | POA: Insufficient documentation

## 2021-05-24 DIAGNOSIS — R079 Chest pain, unspecified: Secondary | ICD-10-CM | POA: Insufficient documentation

## 2021-05-24 DIAGNOSIS — R059 Cough, unspecified: Secondary | ICD-10-CM | POA: Insufficient documentation

## 2021-05-24 LAB — CBC
HCT: 45.8 % (ref 39.0–52.0)
Hemoglobin: 14.8 g/dL (ref 13.0–17.0)
MCH: 28.8 pg (ref 26.0–34.0)
MCHC: 32.3 g/dL (ref 30.0–36.0)
MCV: 89.1 fL (ref 80.0–100.0)
Platelets: 249 10*3/uL (ref 150–400)
RBC: 5.14 MIL/uL (ref 4.22–5.81)
RDW: 12.7 % (ref 11.5–15.5)
WBC: 7.5 10*3/uL (ref 4.0–10.5)
nRBC: 0 % (ref 0.0–0.2)

## 2021-05-24 LAB — BASIC METABOLIC PANEL
Anion gap: 7 (ref 5–15)
BUN: 13 mg/dL (ref 6–20)
CO2: 28 mmol/L (ref 22–32)
Calcium: 8.9 mg/dL (ref 8.9–10.3)
Chloride: 102 mmol/L (ref 98–111)
Creatinine, Ser: 1.03 mg/dL (ref 0.61–1.24)
GFR, Estimated: >60 mL/min (ref >60)
Glucose, Bld: 118 mg/dL — ABNORMAL HIGH (ref 70–99)
Potassium: 3.8 mmol/L (ref 3.5–5.1)
Sodium: 137 mmol/L (ref 135–145)

## 2021-05-24 LAB — TROPONIN I (HIGH SENSITIVITY): Troponin I (High Sensitivity): 4 ng/L (ref <18)

## 2021-05-24 MED ORDER — PHENYLEPHRINE HCL 10 MG PO TABS
10.0000 mg | ORAL_TABLET | Freq: Once | ORAL | Status: AC
Start: 2021-05-24 — End: 2021-05-25
  Administered 2021-05-25: 10 mg via ORAL
  Filled 2021-05-24 (×2): qty 1

## 2021-05-24 MED ORDER — ASPIRIN 81 MG PO CHEW
162.0000 mg | CHEWABLE_TABLET | Freq: Once | ORAL | Status: AC
  Administered 2021-05-24: 162 mg via ORAL
  Filled 2021-05-24: qty 2

## 2021-05-24 MED ORDER — KETOROLAC TROMETHAMINE 30 MG/ML IJ SOLN: 30.0000 mg | Freq: Once | INTRAMUSCULAR | Status: DC

## 2021-05-24 MED ORDER — PROCHLORPERAZINE MALEATE 10 MG PO TABS: 10.0000 mg | ORAL_TABLET | Freq: Four times a day (QID) | ORAL | 0 refills | Status: DC | PRN

## 2021-05-24 MED ORDER — PROCHLORPERAZINE MALEATE 10 MG PO TABS
10.0000 mg | ORAL_TABLET | Freq: Once | ORAL | Status: DC
  Filled 2021-05-24: qty 1

## 2021-05-24 NOTE — ED Provider Notes (Signed)
[facility] Provider Note    Event Date/Time   First MD Initiated Contact with Patient 05/24/21 1353     (approximate)   History   Chief Complaint Headache  HPI  Patrick Huffman is a 31 y.o. male with past medical history of heart murmur and palpitations who presents to the ED complaining of headache.  Patient states that about 1 week ago he was seen in the ED for a sinus infection and prescribed steroids as well as azithromycin.  He states that he has almost completed the azithromycin but has developed gradually worsening headache over the past 2 days.  He states the headache is diffuse and throbbing, affects both sides of his head as well as his frontal scalp.  Pain has been constant and is not exacerbated or alleviated by anything in particular.  He denies any associated vision changes, speech changes, numbness, weakness, fevers, or neck stiffness.  He reports migraines in the past that were similar but not quite as severe.  He states he continues to deal with a cough related to the bronchitis but denies any pain in his chest or difficulty breathing.     Physical Exam   Triage Vital Signs: ED Triage Vitals  Enc Vitals Group     BP 05/24/21 1320 124/88     Pulse Rate 05/24/21 1320 80     Resp 05/24/21 1320 17     Temp 05/24/21 1320 98.5 F (36.9 C)     Temp Source 05/24/21 1320 Oral     SpO2 05/24/21 1320 96 %     Weight 05/24/21 1318 180 lb (81.6 kg)     Height 05/24/21 1318 6' (1.829 m)     Head Circumference --      Peak Flow --      Pain Score 05/24/21 1318 0     Pain Loc --      Pain Edu? --      Excl. in [place]? --     Most recent vital signs: Vitals:   05/24/21 1320  BP: 124/88  Pulse: 80  Resp: 17  Temp: 98.5 F (36.9 C)  SpO2: 96%    Constitutional: Alert and oriented. Eyes: Conjunctivae are normal. Head: Atraumatic. Nose: No congestion/rhinnorhea. Mouth/Throat: Mucous membranes are moist.  Neck: No  meningismus. Cardiovascular: Normal rate, regular rhythm. Grossly normal heart sounds.  2+ radial pulses bilaterally. Respiratory: Normal respiratory effort.  No retractions. Lungs CTAB. Gastrointestinal: Soft and nontender. No distention. Musculoskeletal: No lower extremity tenderness nor edema.  Neurologic:  Normal speech and language. No gross focal neurologic deficits are appreciated.    ED Results / Procedures / Treatments   Labs (all labs ordered are listed, but only abnormal results are displayed) Labs Reviewed - No data to display   PROCEDURES:  Critical Care performed: No  Procedures   MEDICATIONS ORDERED IN ED: Medications - No data to display   IMPRESSION / MDM / ASSESSMENT AND PLAN / ED COURSE  I reviewed the triage vital signs and the nursing notes.                              31 y.o. male with past medical history of heart murmur and palpitations who presents to the ED complaining of 2 days of gradually worsening bilateral and frontal headache after recently being treated for bronchitis.  Differential diagnosis includes, but is not limited to, tension headache, migraine headache, SAH,  meningitis, viral syndrome, and medication effect.  Patient is nontoxic-appearing and in no acute distress, vital signs are reassuring and he has no signs of meningismus.  He has a nonfocal neurologic exam and given gradually worsening symptoms I doubt SAH.  Symptoms likely related to ongoing viral syndrome versus tension or migraine headache.  We will treat symptomatically with IM Toradol and Compazine, do not feel imaging of his head is indicated at this time.  Patient is now requesting to be discharged home prior to symptomatic management.  He will be prescribed a course of Compazine for use as needed, was counseled to return to the ED for new or worsening symptoms.  Patient agrees with plan.      FINAL CLINICAL IMPRESSION(S) / ED DIAGNOSES   Final diagnoses:  Acute  nonintractable headache, unspecified headache type     Rx / DC Orders   ED Discharge Orders          Ordered    prochlorperazine (COMPAZINE) 10 MG tablet  Every 6 hours PRN        05/24/21 1504             Note:  This document was prepared using Dragon voice recognition software and may include unintentional dictation errors.   Chesley Noon, MD 05/24/21 1504

## 2021-05-24 NOTE — ED Notes (Signed)
No orders at this time per MD Stafford.  

## 2021-05-24 NOTE — ED Triage Notes (Signed)
Patient reports intermittent headache/ head pressure, blurred vision since starting azithromycin for pneumonia/bronchitis.

## 2021-05-24 NOTE — ED Triage Notes (Signed)
Pt presents to ER from home with chest pain, reports he was eating and started to feel "sweating and fatigued" also felt hot. Pt reports he came for same symptoms early in the morning. Pt reports he also has pressure in his head, reports does not feel like a migraine more like a strain. Pt reports blurry vision. Pt talks in complete sentences no respiratory distress noted.

## 2021-05-25 LAB — TROPONIN I (HIGH SENSITIVITY): Troponin I (High Sensitivity): 3 ng/L (ref <18)

## 2021-05-25 MED ORDER — ASPIRIN EC 81 MG PO TBEC
81.0000 mg | DELAYED_RELEASE_TABLET | Freq: Every day | ORAL | 2 refills | Status: AC
Start: 2021-05-25 — End: 2022-05-25

## 2021-05-25 MED ORDER — PSEUDOEPHEDRINE HCL 30 MG PO TABS: 30.0000 mg | ORAL_TABLET | Freq: Four times a day (QID) | ORAL | 2 refills | Status: DC | PRN

## 2021-05-25 NOTE — ED Notes (Signed)
NAD noted at time of D/C. Pt denies questions or concerns. Pt ambulatory to the lobby at this time. Verbal consent for D/C obtained.  

## 2021-05-25 NOTE — ED Notes (Signed)
Pt c/o intermittent pain around his "left titty". Pt states he thinks it's muscular. Pt visualized in NAD at this time.

## 2021-05-25 NOTE — ED Provider Notes (Signed)
[facility] Provider Note    Event Date/Time   First MD Initiated Contact with Patient 05/24/21 2323     (approximate)   History   Chest Pain   HPI  Patrick Huffman is a 31 y.o. male with a history of smoking, palpitations, migraine headaches, and heart murmur who presents for evaluation of chest pain.  Patient reports that he has been having chest pain on and off for about a month.  The pain can happen both at rest and with exertion.  The pain usually lasts a few minutes and resolves without intervention.  This evening the pain was more severe which prompted his visit to the emergency room.  He reports that he was eating when he developed severe dull left-sided chest pain.  He reports that he was clammy and diaphoretic.  The pain then subsided in a few minutes which made him concerned.  He does have family history of heart disease.  He reports that the pain lasted a couple of hours and resolved without intervention.  No nausea or vomiting.  Denies any pain at this time.  No shortness of breath, no vomiting.  He is also complaining of a couple of days of a mild pressure-like headache.  He has been diagnosed with bronchitis and reports that the headache started after being on prednisone.  He reports that the headache is intermittent, he has had a lot of congestion associated with his bronchitis.  No thunderclap headache, no syncope no personal or family history of PE or DVT, no recent travel immobilization, no leg pain or swelling, no hemoptysis or exogenous hormones for     Past Medical History:  Diagnosis Date   Back pain    Heart murmur    Hernia, inguinal     History reviewed. No pertinent surgical history.   Physical Exam   Triage Vital Signs: ED Triage Vitals  Enc Vitals Group     BP 05/24/21 2127 117/77     Pulse Rate 05/24/21 2127 75     Resp 05/24/21 2127 16     Temp 05/24/21 2127 98.6 F (37 C)     Temp Source 05/24/21 2127 Oral     SpO2  05/24/21 2127 97 %     Weight 05/24/21 2129 180 lb (81.6 kg)     Height 05/24/21 2129 6' (1.829 m)     Head Circumference --      Peak Flow --      Pain Score 05/24/21 2129 7     Pain Loc --      Pain Edu? --      Excl. in [place]? --     Most recent vital signs: Vitals:   05/24/21 2342 05/25/21 0056  BP: 112/74 115/76  Pulse: 76 64  Resp: 20 18  Temp:    SpO2: 100% 98%     Constitutional: Alert and oriented. Well appearing and in no apparent distress. HEENT:      Head: Normocephalic and atraumatic.         Eyes: Conjunctivae are normal. Sclera is non-icteric.       Mouth/Throat: Mucous membranes are moist.       Neck: Supple with no signs of meningismus. Cardiovascular: Regular rate and rhythm. No murmurs, gallops, or rubs. 2+ symmetrical distal pulses are present in all extremities.  Respiratory: Normal respiratory effort. Lungs are clear to auscultation bilaterally.  Gastrointestinal: Soft, non tender, and non distended with positive bowel sounds. No rebound or  guarding. Genitourinary: No CVA tenderness. Musculoskeletal:  No edema, cyanosis, or erythema of extremities. Neurologic: Normal speech and language. Face is symmetric. Moving all extremities. No gross focal neurologic deficits are appreciated. Skin: Skin is warm, dry and intact. No rash noted. Psychiatric: Mood and affect are normal. Speech and behavior are normal.  ED Results / Procedures / Treatments   Labs (all labs ordered are listed, but only abnormal results are displayed) Labs Reviewed  BASIC METABOLIC PANEL - Abnormal; Notable for the following components:      Result Value   Glucose, Bld 118 (*)    All other components within normal limits  CBC  TROPONIN I (HIGH SENSITIVITY)  TROPONIN I (HIGH SENSITIVITY)     EKG  ED ECG REPORT I, Rudene Re, the attending physician, personally viewed and interpreted this ECG.  Normal sinus rhythm with a rate of 71, T wave inversions in inferior leads, no  ST elevations or depressions.  Unchanged when compared to prior from 2 months ago   RADIOLOGY I, Rudene Re, attending MD, have personally viewed and interpreted the images obtained during this visit as below:  Chest x-ray with no acute finding   ___________________________________________________ Interpretation by Radiologist:  DG Chest 2 View  Result Date: 05/24/2021 CLINICAL DATA:  Chest pain EXAM: CHEST - 2 VIEW.  Patient is slightly rotated on frontal view. COMPARISON:  Chest x-ray 05/17/2021, CT chest 04/14/2020 FINDINGS: The heart and mediastinal contours are within normal limits. No focal consolidation. No pulmonary edema. No pleural effusion. No pneumothorax. No acute osseous abnormality. IMPRESSION: No active cardiopulmonary disease. Electronically Signed   By: Iven Finn M.D.   On: 05/24/2021 22:27      PROCEDURES:  Critical Care performed: No  Procedures    IMPRESSION / MDM / ASSESSMENT AND PLAN / ED COURSE  I reviewed the triage vital signs and the nursing notes.  31 y.o. male with a history of smoking, palpitations, migraine headaches, and heart murmur who presents for evaluation of chest pain.  Patient with 1 month of intermittent episodes of left-sided chest pain with a more severe episode this evening.  Pain-free at this time.  Patient is otherwise well-appearing in no distress with normal vital signs, normal work of breathing normal sats, no asymmetric leg swelling.  Ddx: ACS versus esophageal spasms versus indigestion GERD versus myocarditis versus pericarditis.  Patient is PERC negative therefore less likely PE.   Plan: EKG, troponin x2, CBC, chemistry panel, chest x-ray.  We will give 2 baby aspirin since patient took 2 at home.  He is also complaining of congestion with a frontal headache that is most likely a sinus headache.  Will give Sudafed and recommended sinus rinses.   MEDICATIONS GIVEN IN ED: Medications  aspirin chewable tablet 162 mg  (162 mg Oral Given 05/24/21 2341)  phenylephrine (SUDAFED PE) tablet 10 mg (10 mg Oral Given 05/25/21 0036)     ED COURSE: EKG unchanged from baseline.  2 high-sensitivity troponins were negative.  Chest x-ray with no acute findings.  Normal chemistry panel, no white count, no anemia.  Patient remains with no further episodes of chest pain.  I did review patient's record including his visit to cardiology in 2021 for similar symptoms.  At that time he had a stress test that was unremarkable.  Admission was considered but with no further episodes of chest pain, negative work-up I do not feel patient needs to be admitted at this time.  Recommended close follow-up with his cardiologist for  repeat stress test.  Recommended continued daily baby aspirin and discussed my standard return precautions.   Consults: None   EMR reviewed including patient's visit with cardiologist and results of the test done in 2021    FINAL CLINICAL IMPRESSION(S) / ED DIAGNOSES   Final diagnoses:  Chest pain, unspecified type  Sinus headache     Rx / DC Orders   ED Discharge Orders          Ordered    pseudoephedrine (SUDAFED) 30 MG tablet  Every 6 hours PRN        05/25/21 0101    aspirin EC 81 MG tablet  Daily        05/25/21 0101             Note:  This document was prepared using Dragon voice recognition software and may include unintentional dictation errors.   Please note:  Patient was evaluated in Emergency Department today for the symptoms described in the history of present illness. Patient was evaluated in the context of the global COVID-19 pandemic, which necessitated consideration that the patient might be at risk for infection with the SARS-CoV-2 virus that causes COVID-19. Institutional protocols and algorithms that pertain to the evaluation of patients at risk for COVID-19 are in a state of rapid change based on information released by regulatory bodies including the CDC and federal and  state organizations. These policies and algorithms were followed during the patient's care in the ED.  Some ED evaluations and interventions may be delayed as a result of limited staffing during the pandemic.       Alfred Levins, [place], MD 05/25/21 [phone]

## 2021-05-25 NOTE — Discharge Instructions (Signed)

## 2021-06-15 ENCOUNTER — Emergency Department: Payer: Self-pay

## 2021-06-15 ENCOUNTER — Other Ambulatory Visit: Payer: Self-pay

## 2021-06-15 ENCOUNTER — Emergency Department
Admission: EM | Admit: 2021-06-15 | Discharge: 2021-06-15 | Disposition: A | Discharge status: Home / Self Care | Payer: Self-pay | Attending: Emergency Medicine | Admitting: Emergency Medicine

## 2021-06-15 ENCOUNTER — Encounter: Payer: Self-pay | Admitting: Emergency Medicine

## 2021-06-15 DIAGNOSIS — R079 Chest pain, unspecified: Secondary | ICD-10-CM | POA: Insufficient documentation

## 2021-06-15 DIAGNOSIS — M792 Neuralgia and neuritis, unspecified: Secondary | ICD-10-CM | POA: Insufficient documentation

## 2021-06-15 DIAGNOSIS — R531 Weakness: Secondary | ICD-10-CM | POA: Insufficient documentation

## 2021-06-15 DIAGNOSIS — G518 Other disorders of facial nerve: Secondary | ICD-10-CM

## 2021-06-15 DIAGNOSIS — J0100 Acute maxillary sinusitis, unspecified: Secondary | ICD-10-CM | POA: Insufficient documentation

## 2021-06-15 LAB — CBC WITH DIFFERENTIAL/PLATELET
Abs Immature Granulocytes: 0.02 10*3/uL (ref 0.00–0.07)
Basophils Absolute: 0 10*3/uL (ref 0.0–0.1)
Basophils Relative: 1 %
Eosinophils Absolute: 0.2 10*3/uL (ref 0.0–0.5)
Eosinophils Relative: 3 %
HCT: 46 % (ref 39.0–52.0)
Hemoglobin: 14.9 g/dL (ref 13.0–17.0)
Immature Granulocytes: 0 %
Lymphocytes Relative: 46 %
Lymphs Abs: 3.7 10*3/uL (ref 0.7–4.0)
MCH: 28.1 pg (ref 26.0–34.0)
MCHC: 32.4 g/dL (ref 30.0–36.0)
MCV: 86.6 fL (ref 80.0–100.0)
Monocytes Absolute: 0.6 10*3/uL (ref 0.1–1.0)
Monocytes Relative: 7 %
Neutro Abs: 3.4 10*3/uL (ref 1.7–7.7)
Neutrophils Relative %: 43 %
Platelets: 193 10*3/uL (ref 150–400)
RBC: 5.31 MIL/uL (ref 4.22–5.81)
RDW: 12.8 % (ref 11.5–15.5)
WBC: 7.9 10*3/uL (ref 4.0–10.5)
nRBC: 0 % (ref 0.0–0.2)

## 2021-06-15 LAB — BASIC METABOLIC PANEL
Anion gap: 11 (ref 5–15)
BUN: 14 mg/dL (ref 6–20)
CO2: 27 mmol/L (ref 22–32)
Calcium: 9.2 mg/dL (ref 8.9–10.3)
Chloride: 102 mmol/L (ref 98–111)
Creatinine, Ser: 1.11 mg/dL (ref 0.61–1.24)
GFR, Estimated: >60 mL/min (ref >60)
Glucose, Bld: 105 mg/dL — ABNORMAL HIGH (ref 70–99)
Potassium: 4.2 mmol/L (ref 3.5–5.1)
Sodium: 140 mmol/L (ref 135–145)

## 2021-06-15 LAB — TROPONIN I (HIGH SENSITIVITY): Troponin I (High Sensitivity): 3 ng/L (ref <18)

## 2021-06-15 MED ORDER — GADOBUTROL 1 MMOL/ML IV SOLN
7.5000 mL | Freq: Once | INTRAVENOUS | Status: AC | PRN
  Administered 2021-06-15: 7.5 mL via INTRAVENOUS

## 2021-06-15 MED ORDER — PREDNISONE 20 MG PO TABS: 40.0000 mg | ORAL_TABLET | Freq: Every day | ORAL | 0 refills | Status: AC

## 2021-06-15 MED ORDER — PSEUDOEPHEDRINE HCL 60 MG PO TABS: 60.0000 mg | ORAL_TABLET | Freq: Four times a day (QID) | ORAL | 0 refills | Status: DC | PRN

## 2021-06-15 NOTE — ED Notes (Signed)
Pt back to room from MRI.

## 2021-06-15 NOTE — ED Notes (Signed)
Dr. Siadecki at bedside 

## 2021-06-15 NOTE — ED Notes (Signed)
Pt taken for MRI.

## 2021-06-15 NOTE — Discharge Instructions (Addendum)
Your MRI shows inflammation in your sinus under the left cheek.  Take the prednisone for 5 days as prescribed and the Sudafed as needed for the next several days.  This should help both the sinus inflammation and the numbness and tingling you feel.  You can continue the Flonase that you already have at home. ? ?The weakness in the left arm is likely caused by neuropraxia which is compression of the nerve likely from when you slept on it wrong a few days ago.  This should improve on its own over a few days to weeks.  However, if it does not improve you should follow-up with a neurologist.  Referral information has been provided.  You can also follow-up with your primary care doctor. ? ?Return to the ER for new, worsening, or persistent severe facial, arm, or leg numbness, any new or worsening weakness in the arm or leg, difficulty walking, changes in your vision, high fever, severe headache, vomiting, chest pain, or any other new or worsening symptoms that concern you. ?

## 2021-06-15 NOTE — ED Provider Notes (Signed)
? ?[facility] ?Provider Note ? ? ? Event Date/Time  ? First MD Initiated Contact with Patient 06/15/21 0818   ?  (approximate) ? ? ?History  ? ?Numbness and Shortness of Breath ? ? ?HPI ? ?Patrick Huffman is a 31 y.o. male with no active medical problems who presents with numbness and tingling to the left side of his face, cute onset around 2 hours ago when he was sitting in bed watching TV.  The patient states that initially he had a sensation of heat in the left side of his face and then this turned into the numbness and tingling.  He then developed chest pain, took an aspirin, and the chest pain quickly resolved.  However, the abnormal sensation to the left side of his face has not resolved.  In addition the patient reports some tingling and numbness as well as some decreased strength to his left arm but this has been for several days.  He states he thinks he may have slept on it in a strange position a few days ago.  It is not any worse today.  He denies any vision changes, speech difficulty, other weakness or numbness, fever, shortness of breath, or other acute symptoms. ? ? ? ?Physical Exam  ? ?Triage Vital Signs: ?ED Triage Vitals  ?Enc Vitals Group  ?   BP 06/15/21 0820 (!) 134/93  ?   Pulse Rate 06/15/21 0820 61  ?   Resp 06/15/21 0820 18  ?   Temp 06/15/21 0820 97.9 ?F (36.6 ?C)  ?   Temp Source 06/15/21 0820 Oral  ?   SpO2 06/15/21 0820 100 %  ?   Weight 06/15/21 0818 185 lb (83.9 kg)  ?   Height 06/15/21 0818 6' (1.829 m)  ?   Head Circumference --   ?   Peak Flow --   ?   Pain Score 06/15/21 0818 6  ?   Pain Loc --   ?   Pain Edu? --   ?   Excl. in [place]? --   ? ? ?Most recent vital signs: ?Vitals:  ? 06/15/21 0820 06/15/21 0841  ?BP: (!) 134/93 130/83  ?Pulse: 61 64  ?Resp: 18 18  ?Temp: 97.9 ?F (36.6 ?C)   ?SpO2: 100% 100%  ? ? ? ?General: Alert and oriented, well appearing. ?CV:  Good peripheral perfusion.  ?Resp:  Normal effort.  ?Abd:  No distention.  ?Other:  Subjective  decreased sensation to left forehead and maxilla, but normal sensation to the left chin.  No facial droop or other cranial nerve abnormality.  Minimal left decreased grip strength, otherwise 5/5 motor strength and intact sensation to all extremities.  No pronator drift.  No ataxia on finger-to-nose. ? ? ?ED Results / Procedures / Treatments  ? ?Labs ?(all labs ordered are listed, but only abnormal results are displayed) ?Labs Reviewed  ?BASIC METABOLIC PANEL - Abnormal; Notable for the following components:  ?    Result Value  ? Glucose, Bld 105 (*)   ? All other components within normal limits  ?CBC WITH DIFFERENTIAL/PLATELET  ?TROPONIN I (HIGH SENSITIVITY)  ? ? ? ?EKG ? ?ED ECG REPORT ?IDionne Bucy, the attending physician, personally viewed and interpreted this ECG. ? ?Date: 06/15/2021 ?EKG Time: 0821 ?Rate: 65 ?Rhythm: normal sinus rhythm ?QRS Axis: normal ?Intervals: Short PR ?ST/T Wave abnormalities: normal ?Narrative Interpretation: no evidence of acute ischemia ? ? ? ?RADIOLOGY ? ?CT head: I independently viewed and interpreted the  images; there is no evidence of ICH, mass, or acute stroke.  Radiology report confirms no acute abnormality. ? ?MR brain/cervical spine: ? ?IMPRESSION:  ?1. Normal MRI appearance of the brain and cervical spine.  ?2. Moderate left maxillary sinus inflammation.  ?3. Left face superficial 2.2 cm sebaceous cyst.  ? ? ?PROCEDURES: ? ?Critical Care performed: No ? ?Procedures ? ? ?MEDICATIONS ORDERED IN ED: ?Medications  ?gadobutrol (GADAVIST) 1 MMOL/ML injection 7.5 mL (7.5 mLs Intravenous Contrast Given 06/15/21 1145)  ? ? ? ?IMPRESSION / MDM / ASSESSMENT AND PLAN / ED COURSE  ?I reviewed the triage vital signs and the nursing notes. ? ?31 year old male with no active medical problems presents with a sensation of heat and paresthesias to the left side of his face that started this morning, as well as some tingling and slight weakness to his left arm over the last several  days. ? ?I reviewed the past medical records in epic.  The patient has had several ED visits for chest discomfort and headache over the last few months, with 4 visits between here and [facility] since 2/4.  Work-ups have been negative.  I do not see any recent outpatient visits or any history of admission. ? ?On exam the patient is well-appearing and his vital signs are normal.  Physical exam is unremarkable except for very minimal decreased grip strength to the left hand, and subjective decreased sensation to the upper and mid face on the left.  Thorough neurologic exam is otherwise normal. ? ?Differential diagnosis includes, but is not limited to, trigeminal neuralgia, other neuralgia syndrome, complex migraine, Bell's palsy, or less likely TIA or CVA.  The patient has no significant stroke risk factors and the pattern of the sensory deficit does not fit a stroke.  Left upper extremity weakness is consistent with neuropraxia or possible radiculopathy.  However this has been present for several days and is not consistent with acute stroke. ? ?We will obtain a CT head, lab work-up, and consult neurology.  Given that the extremity weakness is not acute today, and the facial symptoms do not fit a stroke pattern and are limited to sensation, there is no indication for code stroke activation or emergent neurology consultation prior to initial work-up. ? ?----------------------------------------- ?12:51 PM on 06/15/2021 ?----------------------------------------- ? ?Lab work-up is unremarkable.  Troponin is negative.  Given the reassuring EKG, atypical pain, and extremely low risk of ACS there is no indication for further cardiac work-up in the ED. ? ?CT head was negative.  I consulted Dr. Selina Cooley from neurology who recommended MR of the brain and cervical spine both with and without contrast, however she agreed that the symptoms are much more likely due to neuropraxia and neuralgia rather than CNS cause, and advised that if the  MRI was negative and the patient had no worsening symptoms he would be appropriate for discharge with outpatient follow-up. ? ?The MRI is negative for acute findings in the brain or cervical spine, however there is evidence of left maxillary sinusitis, which corresponds to the area of the patient's facial symptoms. ? ?At this time, the patient is stable for discharge home.  I counseled him on the results of the work-up.  I will prescribe a short course of p.o. steroid as well as a decongestant.  The patient already has Flonase at home.  I counseled him on follow-up with a neurologist.  Return precautions given, and he expresses understanding. ? ? ?FINAL CLINICAL IMPRESSION(S) / ED DIAGNOSES  ? ?Final diagnoses:  ?  Acute maxillary sinusitis, recurrence not specified  ?Facial neuralgia  ? ? ? ?Rx / DC Orders  ? ?ED Discharge Orders   ? ?      Ordered  ?  predniSONE (DELTASONE) 20 MG tablet  Daily       ? 06/15/21 1248  ?  pseudoephedrine (SUDAFED) 60 MG tablet  Every 6 hours PRN       ? 06/15/21 1248  ? ?  ?  ? ?  ? ? ? ?Note:  This document was prepared using Dragon voice recognition software and may include unintentional dictation errors.  ?  Dionne Bucy, MD ?06/15/21 1253 ? ?

## 2021-06-15 NOTE — ED Triage Notes (Signed)
Pt via POV from home. Pt c/o L sided facial numbness, SOB, and L sided CP. Also endorses blurred vision that is intermittent.  Pt states that the L side of his chest and L side of his face feels "hot". States he also had some SOB after. Pt states that this episode started 1 hour PTA. Pt is A&OX4 and NAD.  ?

## 2021-06-15 NOTE — ED Notes (Signed)
Pt back to room from CT

## 2021-06-24 ENCOUNTER — Encounter: Payer: Self-pay | Admitting: Emergency Medicine

## 2021-06-24 ENCOUNTER — Other Ambulatory Visit: Payer: Self-pay

## 2021-06-24 ENCOUNTER — Emergency Department
Admission: EM | Admit: 2021-06-24 | Discharge: 2021-06-24 | Disposition: A | Discharge status: Home / Self Care | Payer: Medicaid Other | Attending: Emergency Medicine | Admitting: Emergency Medicine

## 2021-06-24 DIAGNOSIS — M79621 Pain in right upper arm: Secondary | ICD-10-CM | POA: Insufficient documentation

## 2021-06-24 DIAGNOSIS — R208 Other disturbances of skin sensation: Secondary | ICD-10-CM | POA: Insufficient documentation

## 2021-06-24 DIAGNOSIS — M7918 Myalgia, other site: Secondary | ICD-10-CM | POA: Insufficient documentation

## 2021-06-24 MED ORDER — NAPROXEN 500 MG PO TABS: 500.0000 mg | ORAL_TABLET | Freq: Two times a day (BID) | ORAL | 2 refills | Status: DC

## 2021-06-24 NOTE — ED Triage Notes (Signed)
Pt to ED via POV with c/o right rib and arm pain, he states that he slept on the right side and woke up with it hurting. Pt keeps saying that its his left side but he is holding his right arm. He is saying that when he puts his arm down it feels like he is being stabbed with a knife. He keeps asking is this a "sign of a blood clot? How about a heart attack"? NAD. VSS ?

## 2021-06-24 NOTE — ED Provider Notes (Signed)
? ?[facility] ?Provider Note ? ? ? Event Date/Time  ? First MD Initiated Contact with Patient 06/24/21 1024   ?  (approximate) ? ? ?History  ? ?Chest Pain ? ? ?HPI ? ?Patrick Huffman is a 31 y.o. male who presents with complaints of pain in his right armpit when he lowers his right arm.  Patient reports he slept on his side with his arm above his head.  He reports now when he woke up this morning he has a tightening sensation in his right armpit when he brings his arm down.  Feels fine when he has his arm up.  No weakness or numbness.  No shortness of breath or pleurisy ?  ? ? ?Physical Exam  ? ?Triage Vital Signs: ?ED Triage Vitals  ?Enc Vitals Group  ?   BP 06/24/21 1002 134/67  ?   Pulse Rate 06/24/21 1002 86  ?   Resp 06/24/21 1002 18  ?   Temp 06/24/21 1002 98 ?F (36.7 ?C)  ?   Temp Source 06/24/21 1002 Oral  ?   SpO2 06/24/21 1002 98 %  ?   Weight 06/24/21 1003 83.9 kg (185 lb)  ?   Height 06/24/21 1003 1.829 m (6')  ?   Head Circumference --   ?   Peak Flow --   ?   Pain Score 06/24/21 1003 8  ?   Pain Loc --   ?   Pain Edu? --   ?   Excl. in [place]? --   ? ? ?Most recent vital signs: ?Vitals:  ? 06/24/21 1002  ?BP: 134/67  ?Pulse: 86  ?Resp: 18  ?Temp: 98 ?F (36.7 ?C)  ?SpO2: 98%  ? ? ? ?General: Awake, no distress.  ?CV:  Good peripheral perfusion.  Mild tenderness to palpation just inferior to the right axilla.  No rash or swelling.  Regular rate and rhythm ?Resp:  Normal effort.  CTA B ?Abd:  No distention.  ?Other:   ? ? ?ED Results / Procedures / Treatments  ? ?Labs ?(all labs ordered are listed, but only abnormal results are displayed) ?Labs Reviewed - No data to display ? ? ?EKG ? ?ED ECG REPORT ?I, Lavonia Drafts, the attending physician, personally viewed and interpreted this ECG. ? ?Date: 06/24/2021 ? ?Rhythm: normal sinus rhythm ?QRS Axis: normal ?Intervals: normal ?ST/T Wave abnormalities: normal ?Narrative Interpretation: no evidence of acute  ischemia ? ? ? ?RADIOLOGY ? ? ? ? ?PROCEDURES: ? ?Critical Care performed:  ? ?Procedures ? ? ?MEDICATIONS ORDERED IN ED: ?Medications - No data to display ? ? ?IMPRESSION / MDM / ASSESSMENT AND PLAN / ED COURSE  ?I reviewed the triage vital signs and the nursing notes. ? ? ? ?Patient well-appearing and in no acute distress, exam is consistent with musculoskeletal injury.  Reproducible with arm movement.  Normal strength, no rash ? ?EKG is unremarkable. ? ?Recommend treat with NSAIDs. ? ? ? ?  ? ? ?FINAL CLINICAL IMPRESSION(S) / ED DIAGNOSES  ? ?Final diagnoses:  ?Musculoskeletal pain  ? ? ? ?Rx / DC Orders  ? ?ED Discharge Orders   ? ?      Ordered  ?  naproxen (NAPROSYN) 500 MG tablet  2 times daily with meals       ? 06/24/21 1028  ? ?  ?  ? ?  ? ? ? ?Note:  This document was prepared using Dragon voice recognition software and may include unintentional dictation errors. ?  ?  Lavonia Drafts, MD ?06/24/21 1033 ? ?

## 2021-07-01 ENCOUNTER — Emergency Department: Payer: Medicaid Other

## 2021-07-01 ENCOUNTER — Other Ambulatory Visit: Payer: Self-pay

## 2021-07-01 ENCOUNTER — Emergency Department
Admission: EM | Admit: 2021-07-01 | Discharge: 2021-07-01 | Disposition: A | Discharge status: Home / Self Care | Payer: Medicaid Other | Attending: Emergency Medicine | Admitting: Emergency Medicine

## 2021-07-01 DIAGNOSIS — R0789 Other chest pain: Secondary | ICD-10-CM | POA: Insufficient documentation

## 2021-07-01 LAB — BASIC METABOLIC PANEL
Anion gap: 6 (ref 5–15)
BUN: 10 mg/dL (ref 6–20)
CO2: 24 mmol/L (ref 22–32)
Calcium: 8.8 mg/dL — ABNORMAL LOW (ref 8.9–10.3)
Chloride: 107 mmol/L (ref 98–111)
Creatinine, Ser: 0.95 mg/dL (ref 0.61–1.24)
GFR, Estimated: >60 mL/min (ref >60)
Glucose, Bld: 89 mg/dL (ref 70–99)
Potassium: 4 mmol/L (ref 3.5–5.1)
Sodium: 137 mmol/L (ref 135–145)

## 2021-07-01 LAB — CBC
HCT: 46.1 % (ref 39.0–52.0)
Hemoglobin: 15.1 g/dL (ref 13.0–17.0)
MCH: 27.6 pg (ref 26.0–34.0)
MCHC: 32.8 g/dL (ref 30.0–36.0)
MCV: 84.1 fL (ref 80.0–100.0)
Platelets: 249 10*3/uL (ref 150–400)
RBC: 5.48 MIL/uL (ref 4.22–5.81)
RDW: 12.5 % (ref 11.5–15.5)
WBC: 6.4 10*3/uL (ref 4.0–10.5)
nRBC: 0 % (ref 0.0–0.2)

## 2021-07-01 LAB — LIPASE, BLOOD: Lipase: 39 U/L (ref 11–51)

## 2021-07-01 LAB — TROPONIN I (HIGH SENSITIVITY): Troponin I (High Sensitivity): 3 ng/L (ref <18)

## 2021-07-01 MED ORDER — KETOROLAC TROMETHAMINE 30 MG/ML IJ SOLN
30.0000 mg | Freq: Once | INTRAMUSCULAR | Status: AC
  Administered 2021-07-01: 30 mg via INTRAMUSCULAR
  Filled 2021-07-01: qty 1

## 2021-07-01 NOTE — ED Provider Notes (Signed)
? ?[facility] ?Provider Note ? ? ? Event Date/Time  ? First MD Initiated Contact with Patient 07/01/21 1305   ?  (approximate) ? ? ?History  ? ?Chest Pain ? ? ?HPI ? ?Patrick Huffman is a 31 y.o. male who presents to the ED for evaluation of Chest Pain ?  ?I review ED visits from 3/14 and 3/15 for chest pain.  He was started on treatment for herpes zoster presumptively on the 15th.  No rash. ? ?Patient presents to the ED for evaluation of an episode of chest pain that occurred earlier today while he was in the shower.  He reports left-sided chest pain, radiating towards the center of his chest started rapidly while in the shower.  Reports pain is still lingering.  Denies any coexisting symptoms.  Denies syncope or falls, dyspnea, emesis, falls or trauma.  Denies any rashes.  Has not taken any medications.   ? ?He reports having a outpatient cardiology visit scheduled for tomorrow.  Reports having a negative stress test 5 years ago for similar pains. ? ?Physical Exam  ? ?Triage Vital Signs: ?ED Triage Vitals  ?Enc Vitals Group  ?   BP 07/01/21 1248 108/83  ?   Pulse Rate 07/01/21 1248 71  ?   Resp 07/01/21 1248 18  ?   Temp 07/01/21 1248 97.7 ?F (36.5 ?C)  ?   Temp Source 07/01/21 1248 Oral  ?   SpO2 07/01/21 1248 95 %  ?   Weight 07/01/21 1249 184 lb 15.5 oz (83.9 kg)  ?   Height 07/01/21 1249 6' (1.829 m)  ?   Head Circumference --   ?   Peak Flow --   ?   Pain Score 07/01/21 1248 4  ?   Pain Loc --   ?   Pain Edu? --   ?   Excl. in GC? --   ? ? ?Most recent vital signs: ?Vitals:  ? 07/01/21 1248 07/01/21 1330  ?BP: 108/83 109/82  ?Pulse: 71 (!) 58  ?Resp: 18 (!) 22  ?Temp: 97.7 ?F (36.5 ?C)   ?SpO2: 95% 99%  ? ? ?General: Awake, no distress.  Pleasant and conversational in full sentences. ?CV:  Good peripheral perfusion.  ?Resp:  Normal effort.  ?Abd:  No distention.  ?MSK:  No deformity noted.  No rashes identified to suggest herpes zoster.  Discretely tender to palpation at costosternal  junction around ribs 6 ?Neuro:  No focal deficits appreciated. ?Other:   ? ? ?ED Results / Procedures / Treatments  ? ?Labs ?(all labs ordered are listed, but only abnormal results are displayed) ?Labs Reviewed  ?BASIC METABOLIC PANEL - Abnormal; Notable for the following components:  ?    Result Value  ? Calcium 8.8 (*)   ? All other components within normal limits  ?CBC  ?LIPASE, BLOOD  ?TROPONIN I (HIGH SENSITIVITY)  ?TROPONIN I (HIGH SENSITIVITY)  ? ? ?EKG ?Sinus rhythm, rate of 76 bpm.  Normal axis and intervals.  No evidence of acute ischemia. ? ?RADIOLOGY ?CXR reviewed by me without evidence of acute cardiopulmonary pathology. ? ?Official radiology report(s): ?DG Chest 2 View ? ?Result Date: 07/01/2021 ?CLINICAL DATA:  Chest pain EXAM: CHEST - 2 VIEW COMPARISON:  05/24/2021 FINDINGS: The heart size and mediastinal contours are within normal limits. Both lungs are clear. The visualized skeletal structures are unremarkable. IMPRESSION: No active cardiopulmonary disease. Electronically Signed   By: Judie Petit.  Shick M.D.   On: 07/01/2021 13:11   ? ?  PROCEDURES and INTERVENTIONS: ? ?.1-3 Lead EKG Interpretation ?Performed by: Delton Prairie, MD ?Authorized by: Delton Prairie, MD  ? ?  Interpretation: normal   ?  ECG rate:  61 ?  ECG rate assessment: normal   ?  Rhythm: sinus rhythm   ?  Ectopy: none   ?  Conduction: normal   ? ?Medications  ?ketorolac (TORADOL) 30 MG/ML injection 30 mg (30 mg Intramuscular Given 07/01/21 1358)  ? ? ? ?IMPRESSION / MDM / ASSESSMENT AND PLAN / ED COURSE  ?I reviewed the triage vital signs and the nursing notes. ? ?31 year old male presents to the ED with atypical chest pain likely due to MSK etiology and suitable for outpatient management.  Normal vitals.  He looks clinically well.  Has reproducible symptoms on palpation without overlying skin changes, signs of trauma or herpes zoster.  His work-up is benign with a normal EKG, negative troponin.  Normal CBC, BMP and lipase.  Clear CXR without PTX  or infiltrate.  I see no indications for repeat troponins or CT scans of the chest.  PE is unlikely.  He is PERC negative.  He has follow-up tomorrow with cardiology and I see no barriers to continued outpatient management.  We will discharge with return precautions. ? ?  ? ? ?FINAL CLINICAL IMPRESSION(S) / ED DIAGNOSES  ? ?Final diagnoses:  ?Other chest pain  ? ? ? ?Rx / DC Orders  ? ?ED Discharge Orders   ? ? None  ? ?  ? ? ? ?Note:  This document was prepared using Dragon voice recognition software and may include unintentional dictation errors. ?  ?Delton Prairie, MD ?07/01/21 1450 ? ?

## 2021-07-01 NOTE — Discharge Instructions (Signed)
Please take Tylenol and ibuprofen/Advil for your pain.  It is safe to take them together, or to alternate them every few hours.  Take up to 1000mg of Tylenol at a time, up to 4 times per day.  Do not take more than 4000 mg of Tylenol in 24 hours.  For ibuprofen, take 400-600 mg, 4-5 times per day. ° ° °

## 2021-07-01 NOTE — ED Triage Notes (Signed)
Pt states that while he was taking  a shower this am he started having sharp and tight chest pain in the center of his chest with pressure ?

## 2021-09-15 ENCOUNTER — Emergency Department: Payer: Medicaid Other

## 2021-09-15 ENCOUNTER — Other Ambulatory Visit: Payer: Self-pay

## 2021-09-15 ENCOUNTER — Emergency Department
Admission: EM | Admit: 2021-09-15 | Discharge: 2021-09-15 | Disposition: A | Discharge status: Home / Self Care | Payer: Medicaid Other | Attending: Emergency Medicine | Admitting: Emergency Medicine

## 2021-09-15 DIAGNOSIS — R109 Unspecified abdominal pain: Secondary | ICD-10-CM | POA: Insufficient documentation

## 2021-09-15 DIAGNOSIS — Z5321 Procedure and treatment not carried out due to patient leaving prior to being seen by health care provider: Secondary | ICD-10-CM | POA: Insufficient documentation

## 2021-09-15 LAB — CBC
HCT: 47.5 % (ref 39.0–52.0)
Hemoglobin: 15.3 g/dL (ref 13.0–17.0)
MCH: 28.1 pg (ref 26.0–34.0)
MCHC: 32.2 g/dL (ref 30.0–36.0)
MCV: 87.2 fL (ref 80.0–100.0)
Platelets: 209 10*3/uL (ref 150–400)
RBC: 5.45 MIL/uL (ref 4.22–5.81)
RDW: 12.7 % (ref 11.5–15.5)
WBC: 7.9 10*3/uL (ref 4.0–10.5)
nRBC: 0 % (ref 0.0–0.2)

## 2021-09-15 LAB — COMPREHENSIVE METABOLIC PANEL
ALT: 83 U/L — ABNORMAL HIGH (ref 0–44)
AST: 39 U/L (ref 15–41)
Albumin: 4.2 g/dL (ref 3.5–5.0)
Alkaline Phosphatase: 64 U/L (ref 38–126)
Anion gap: 6 (ref 5–15)
BUN: 14 mg/dL (ref 6–20)
CO2: 24 mmol/L (ref 22–32)
Calcium: 9.3 mg/dL (ref 8.9–10.3)
Chloride: 108 mmol/L (ref 98–111)
Creatinine, Ser: 0.94 mg/dL (ref 0.61–1.24)
GFR, Estimated: >60 mL/min (ref >60)
Glucose, Bld: 103 mg/dL — ABNORMAL HIGH (ref 70–99)
Potassium: 4.3 mmol/L (ref 3.5–5.1)
Sodium: 138 mmol/L (ref 135–145)
Total Bilirubin: 0.3 mg/dL (ref 0.3–1.2)
Total Protein: 7.4 g/dL (ref 6.5–8.1)

## 2021-09-15 LAB — TROPONIN I (HIGH SENSITIVITY): Troponin I (High Sensitivity): 3 ng/L (ref <18)

## 2021-09-15 NOTE — ED Triage Notes (Addendum)
Pt complains of upper abd pain, shortness of breath, lower back pain, sensation of drowning while sleeping, urine that smells like ammonia, pt states is also having chest pain, pt states is also having oxygen dropping and hr below 50 while sleeping.

## 2021-09-15 NOTE — ED Provider Triage Note (Signed)
Emergency Medicine Provider Triage Evaluation Note  Patrick Huffman , a 31 y.o. male  was evaluated in triage.  Pt complains of multiple complaints including abdominal  Review of Systems  Positive: Abdominal pain, difficulty sleeping Negative: No chest pain  Physical Exam  BP 132/83   Pulse 71   Temp 97.9 F (36.6 C) (Oral)   Resp 16   Ht 1.829 m (6')   Wt 83.9 kg   SpO2 100%   BMI 25.09 kg/m  Gen:   Awake, no distress   Resp:  Normal effort  MSK:   Moves extremities without difficulty  Other:    Medical Decision Making  Medically screening exam initiated at 7:36 AM.  Appropriate orders placed.  Patrick Huffman was informed that the remainder of the evaluation will be completed by another provider, this initial triage assessment does not replace that evaluation, and the importance of remaining in the ED until their evaluation is complete.     Jene Every, MD 09/15/21 [id number]

## 2021-12-17 IMAGING — CR DG CHEST 2V
1 series · 2 of 2 positions shown · non-contrast
Comparison: 01/04/2020

CLINICAL DATA: Chest pain

EXAM:
CHEST - 2 VIEW

[Series 1: dg chest 2 view · 0.14mm/px · 2 of 2 slices shown]
[im 1/2]
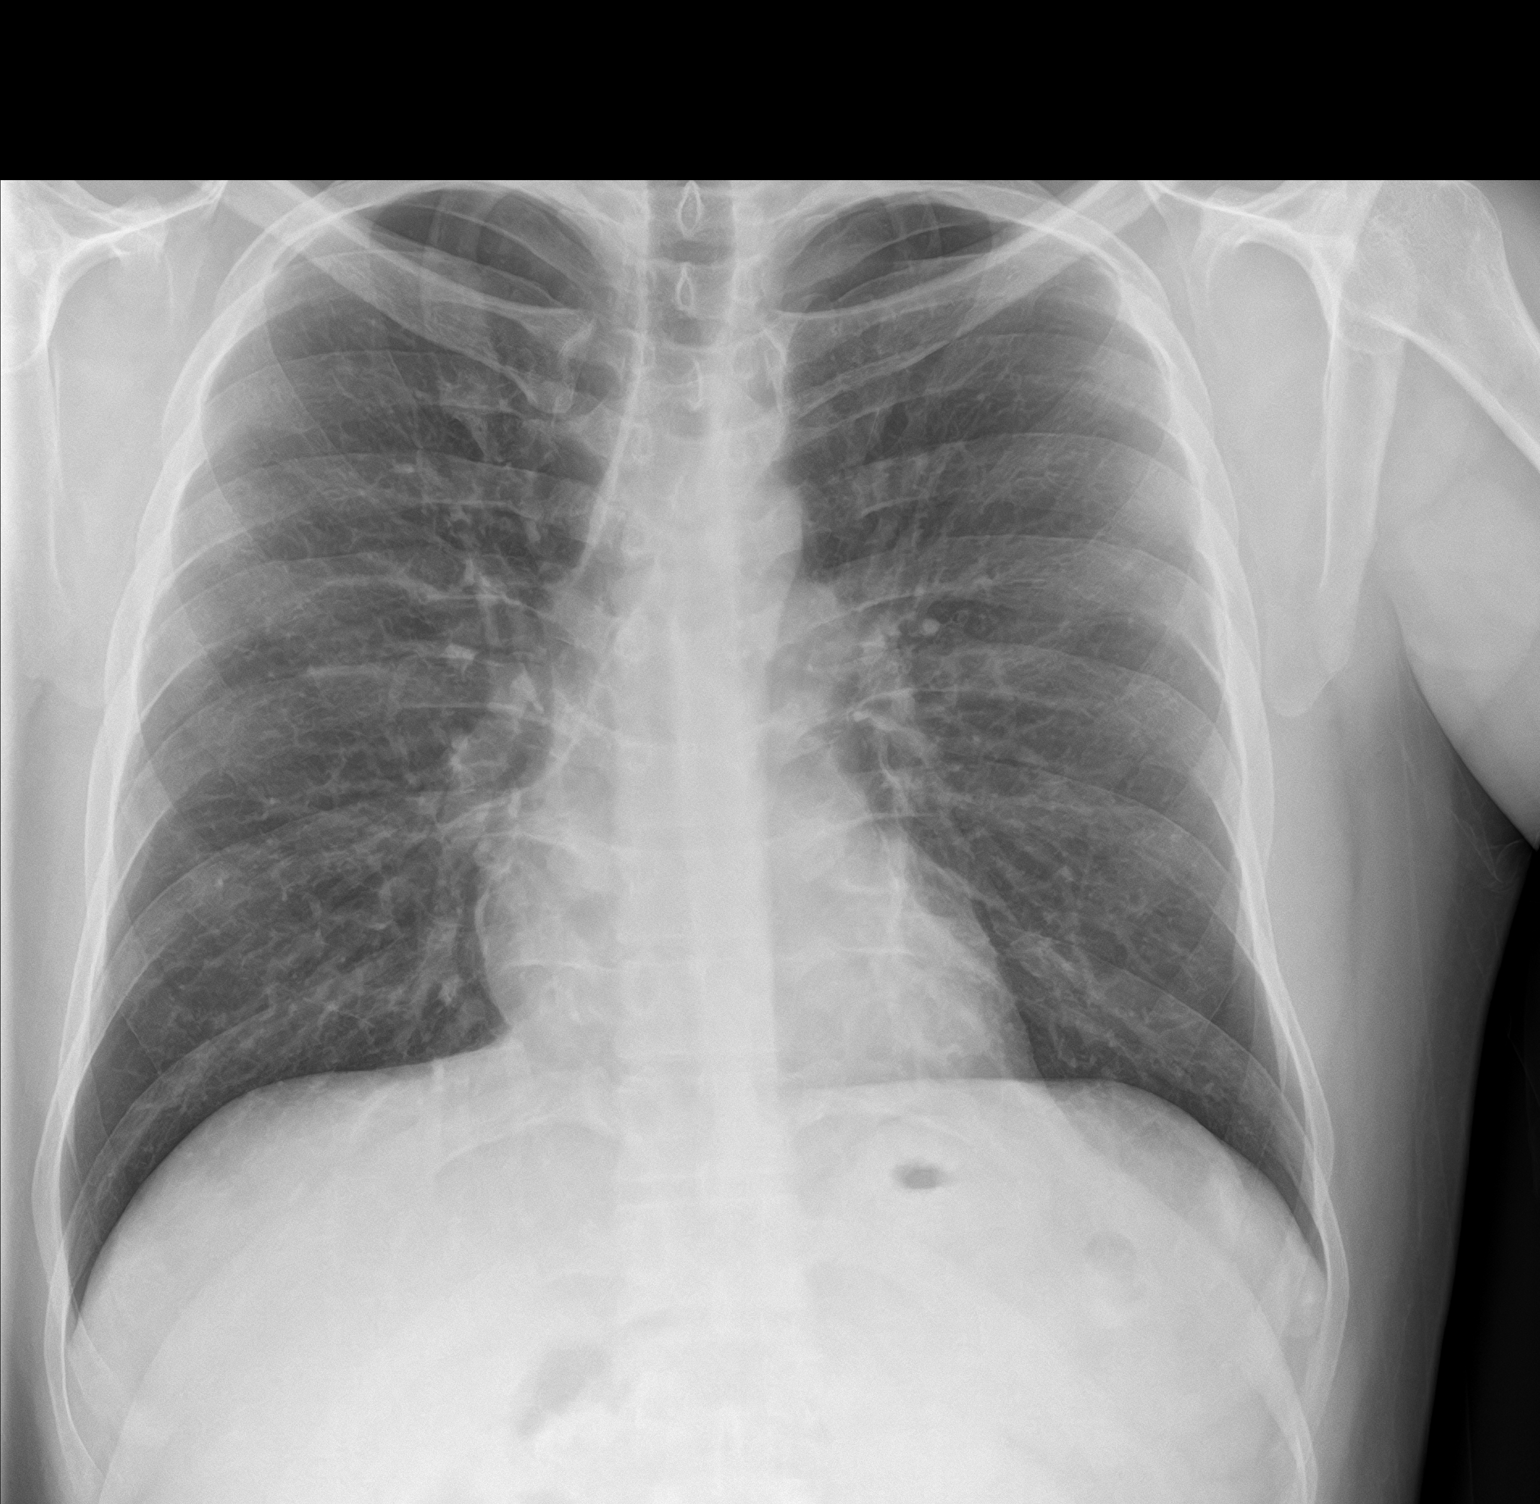
[im 2/2]
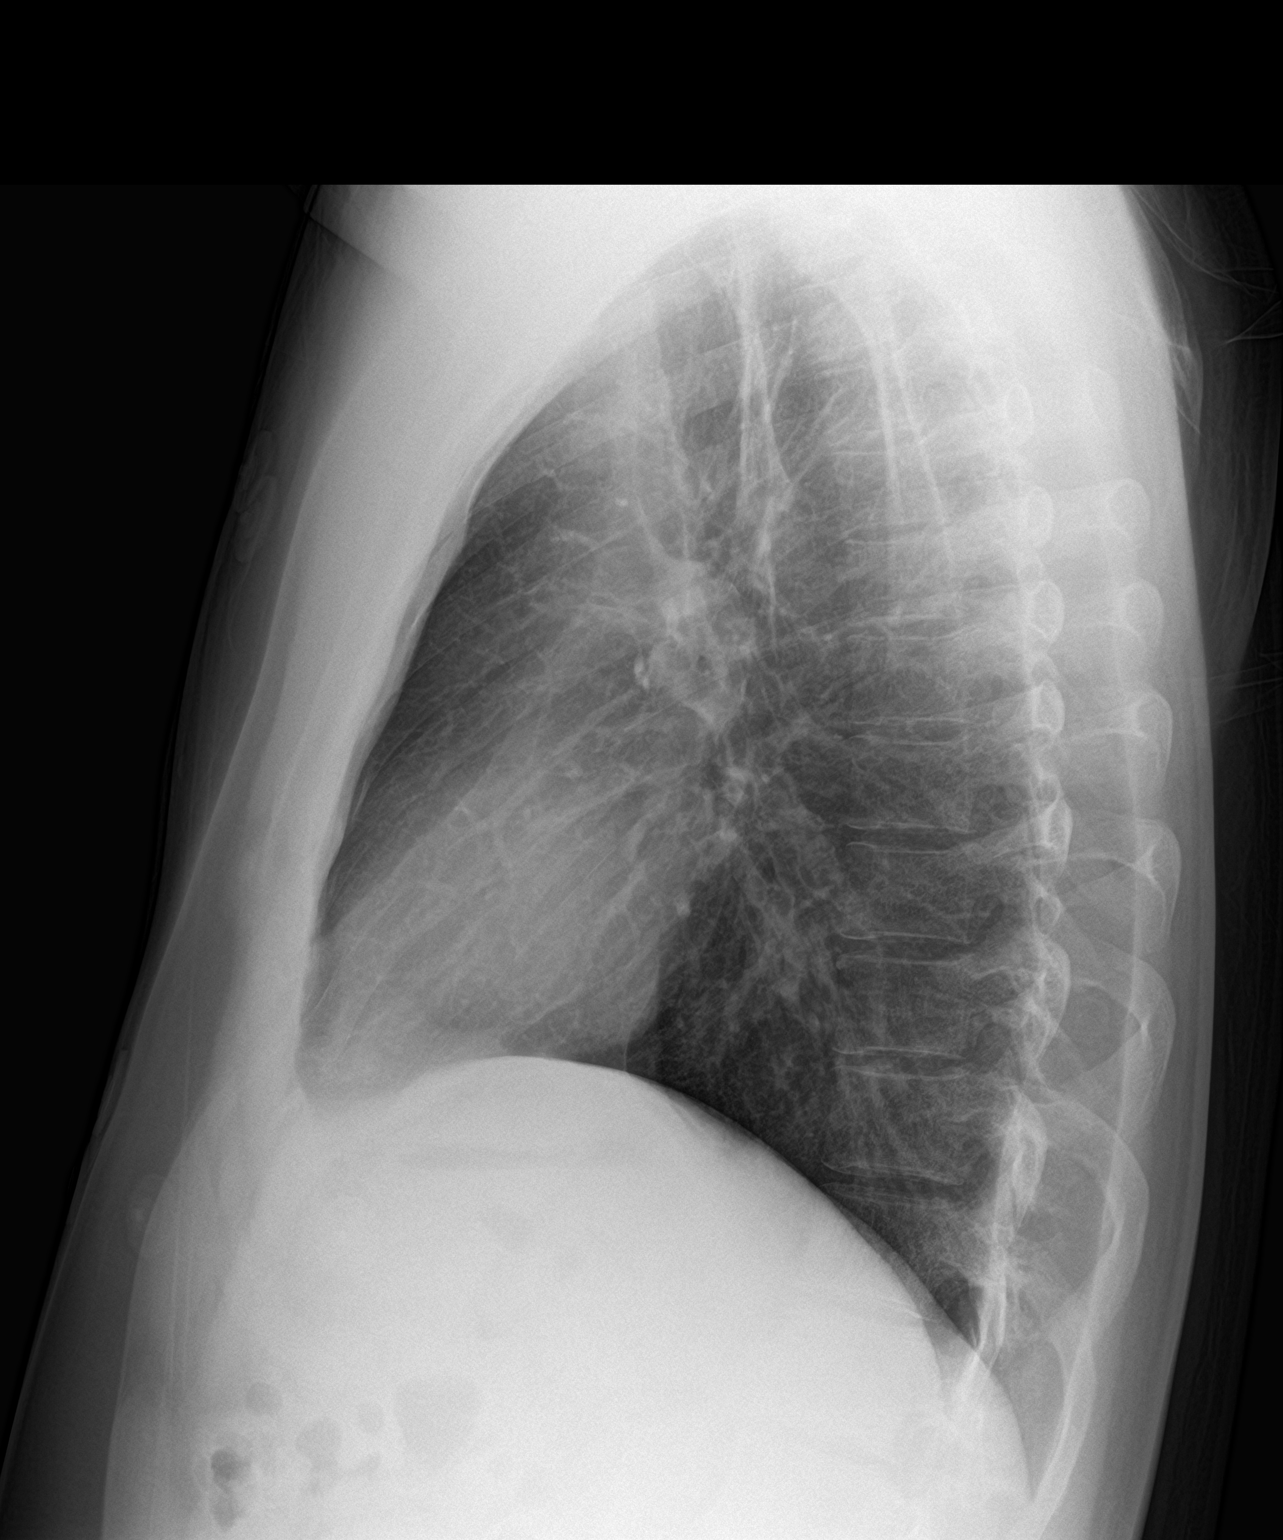

[2 of 2 positions shown; findings below may reference images not displayed]

FINDINGS: The heart size and mediastinal contours are within normal limits.
Both lungs are clear. The visualized skeletal structures are
unremarkable.
IMPRESSION: No active cardiopulmonary disease.

## 2022-03-07 IMAGING — US US EXTREM  UP VENOUS BILAT
1 series · 13 of 24 positions shown · non-contrast
Comparison: Ultrasound 06/12/2019

CLINICAL DATA: Evaluate for DVT, multiple intravenous access

EXAM:
BILATERAL UPPER EXTREMITY VENOUS DOPPLER ULTRASOUND
TECHNIQUE: Gray-scale sonography with graded compression, as well as color
Doppler and duplex ultrasound were performed to evaluate the
bilateral upper extremity deep venous systems from the level of the
subclavian vein and including the jugular, axillary, basilic,
radial, ulnar and upper cephalic vein. Spectral Doppler was utilized
to evaluate flow at rest and with distal augmentation maneuvers.

[Series 1: us venous img upper bilat (dvt) · portal-venous · 13 of 60 slices shown]
[im 1/60]
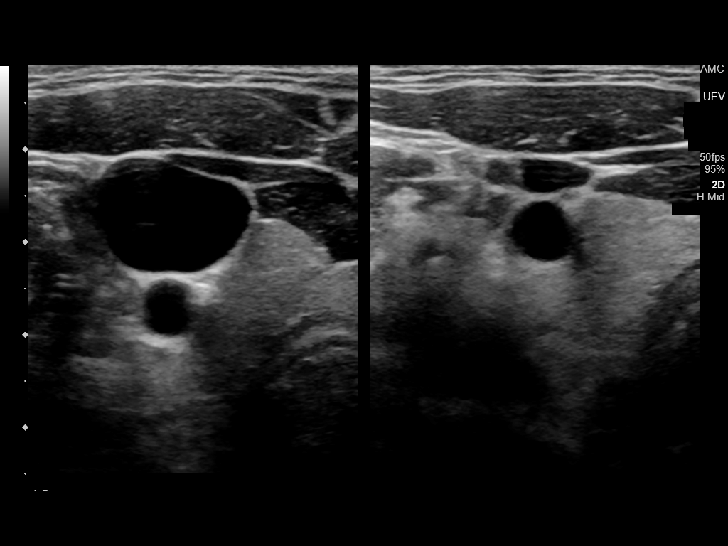
[im 6/60]
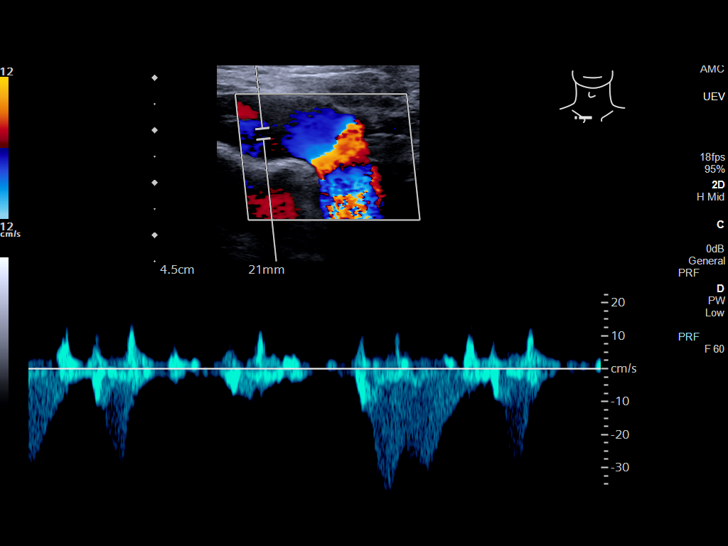
[im 11/60]
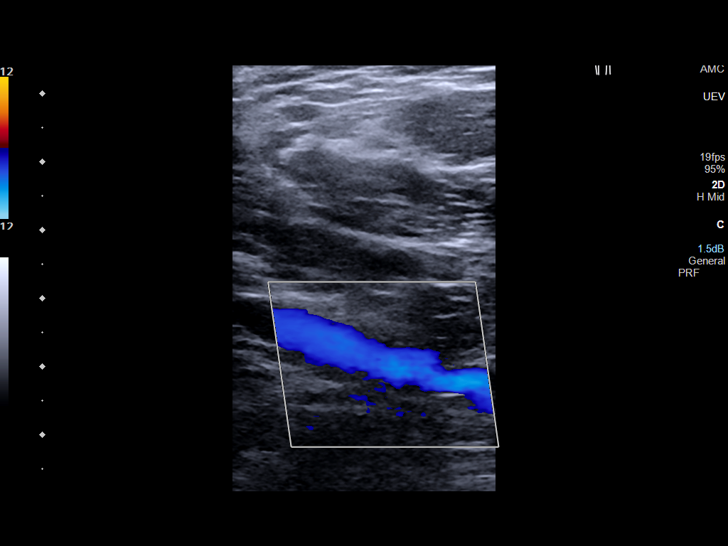
[im 16/60]
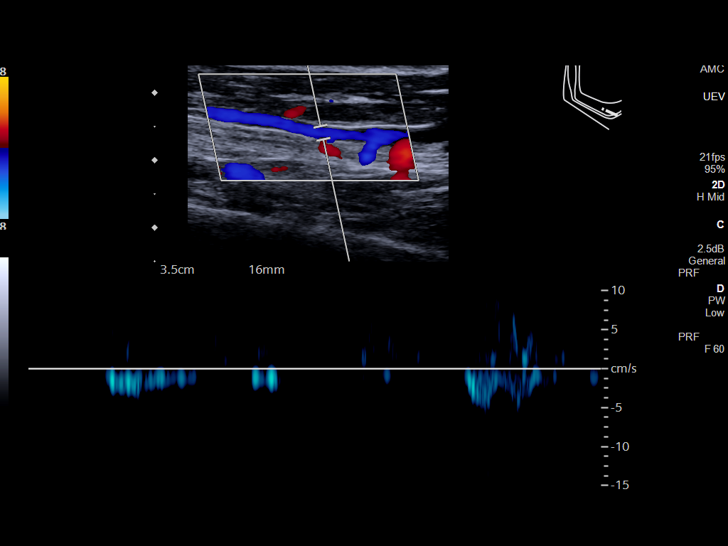
[im 21/60]
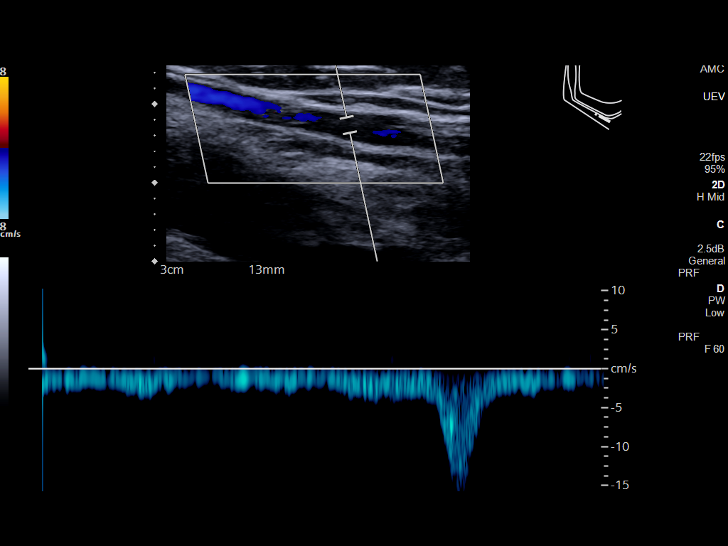
[im 26/60]
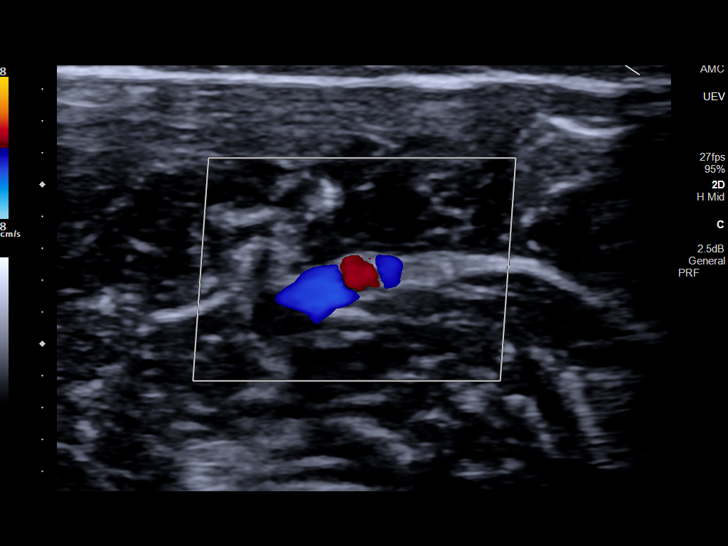
[im 31/60]
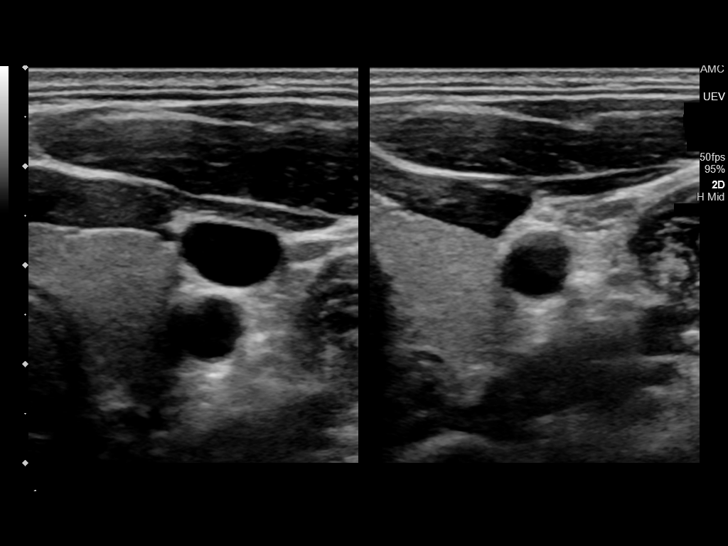
[im 34/60]
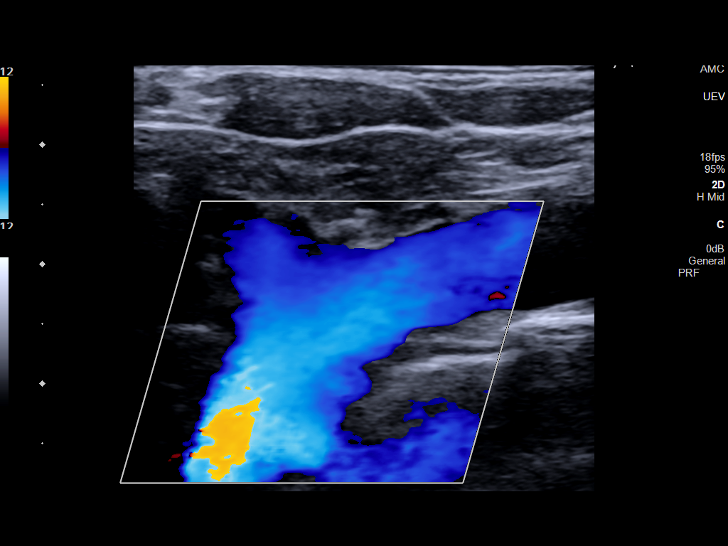
[im 39/60]
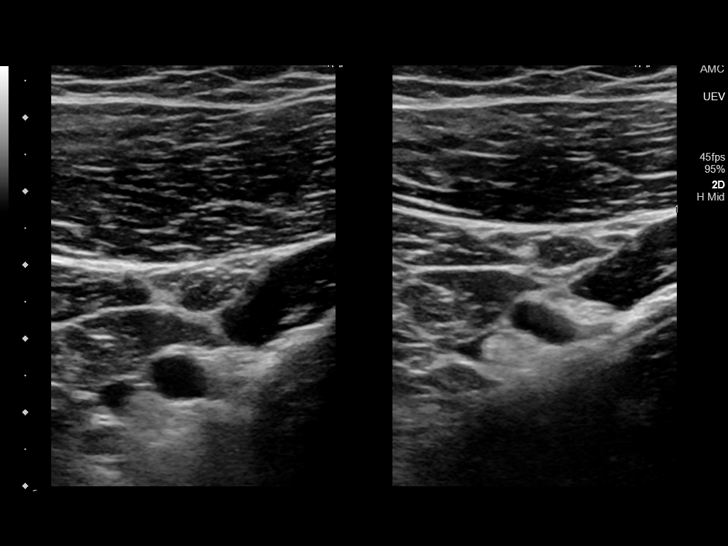
[im 44/60]
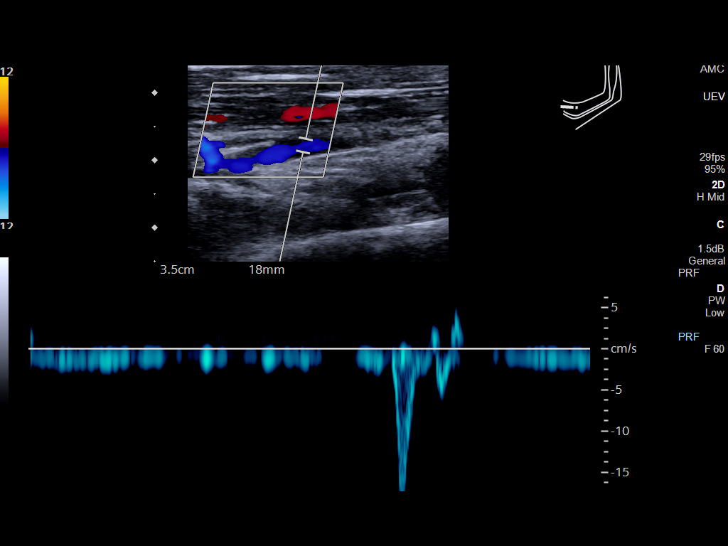
[im 49/60]
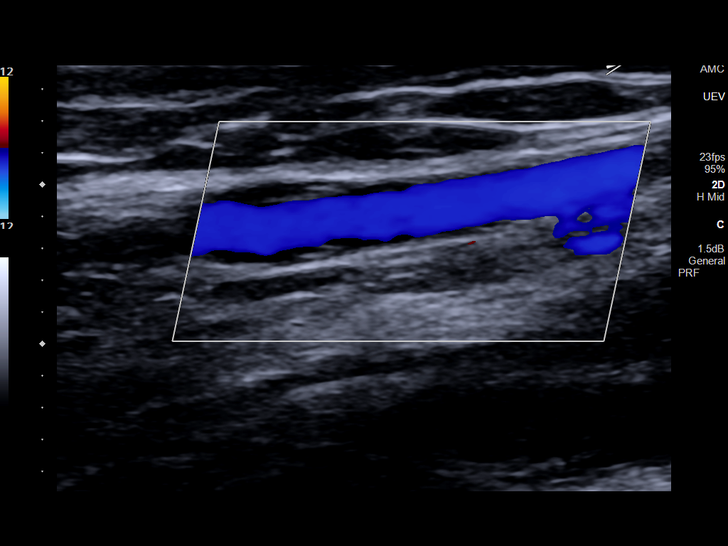
[im 54/60]
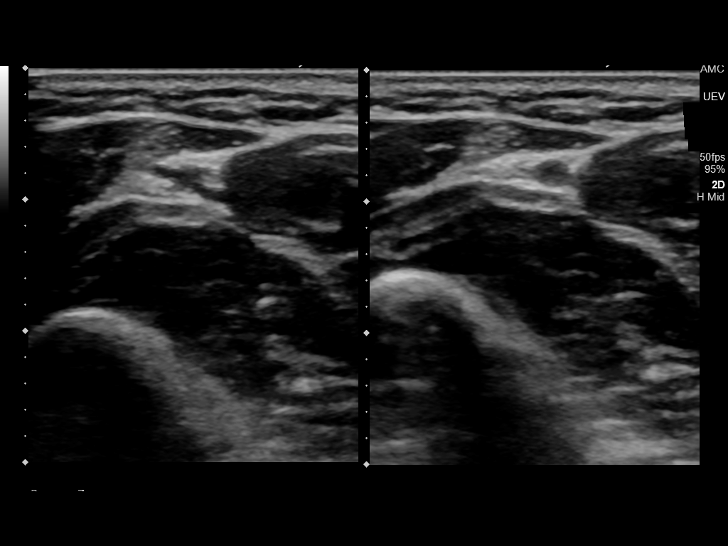
[im 60/60]
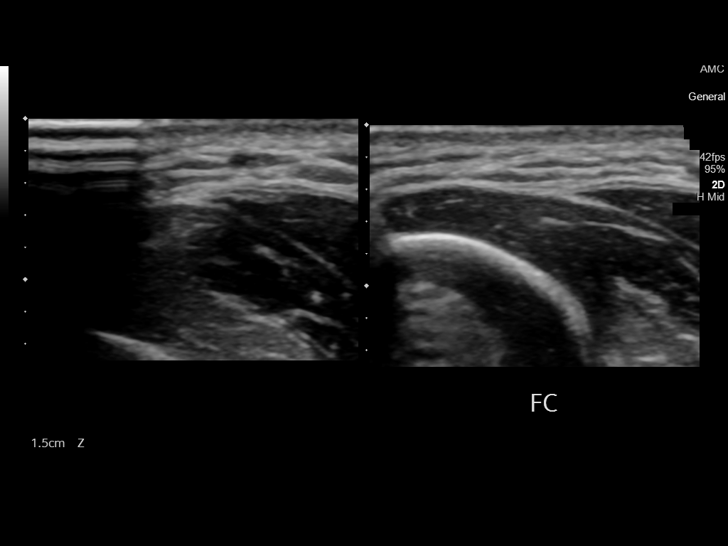

[13 of 24 positions shown; findings below may reference images not displayed]

FINDINGS: RIGHT UPPER EXTREMITY

Internal Jugular Vein: No evidence of thrombus. Normal
compressibility, respiratory phasicity.

Subclavian Vein: No evidence of thrombus. Normal respiratory
phasicity.

Axillary Vein: No evidence of thrombus. Normal compressibility,
respiratory phasicity and response to augmentation.

Cephalic Vein: No evidence of thrombus. Normal compressibility,
respiratory phasicity and response to augmentation.

Basilic Vein: No evidence of thrombus. Normal compressibility,
respiratory phasicity and response to augmentation.

Brachial Veins: No evidence of thrombus. Normal compressibility,
respiratory phasicity and response to augmentation.

Radial Veins: No evidence of thrombus.  Normal compressibility.

Ulnar Veins: No evidence of thrombus.  Normal compressibility.

Venous Reflux:  None visualized.

Other Findings:  None visualized.

LEFT UPPER EXTREMITY

Internal Jugular Vein: No evidence of thrombus. Normal
compressibility, respiratory phasicity.

Subclavian Vein: No evidence of thrombus. Normal respiratory
phasicity.

Axillary Vein: No evidence of thrombus. Normal compressibility,
respiratory phasicity and response to augmentation.

Cephalic Vein: No evidence of thrombus. Normal compressibility,
respiratory phasicity and response to augmentation.

Basilic Vein: No evidence of thrombus. Normal compressibility,
respiratory phasicity and response to augmentation.

Brachial Veins: No evidence of thrombus. Normal compressibility,
respiratory phasicity and response to augmentation.

Radial Veins: No evidence of thrombus.  Normal compressibility.

Ulnar Veins: No evidence of thrombus.  Normal compressibility.

Venous Reflux:  None visualized.

Other Findings:  None visualized.
IMPRESSION: No evidence of DVT within either upper extremity.

## 2022-03-08 IMAGING — CR DG CHEST 2V
2 series · 2 of 2 positions shown · non-contrast
Comparison: March 25, 2020

CLINICAL DATA: Left-sided chest pain.

EXAM:
CHEST - 2 VIEW

[chest pa]
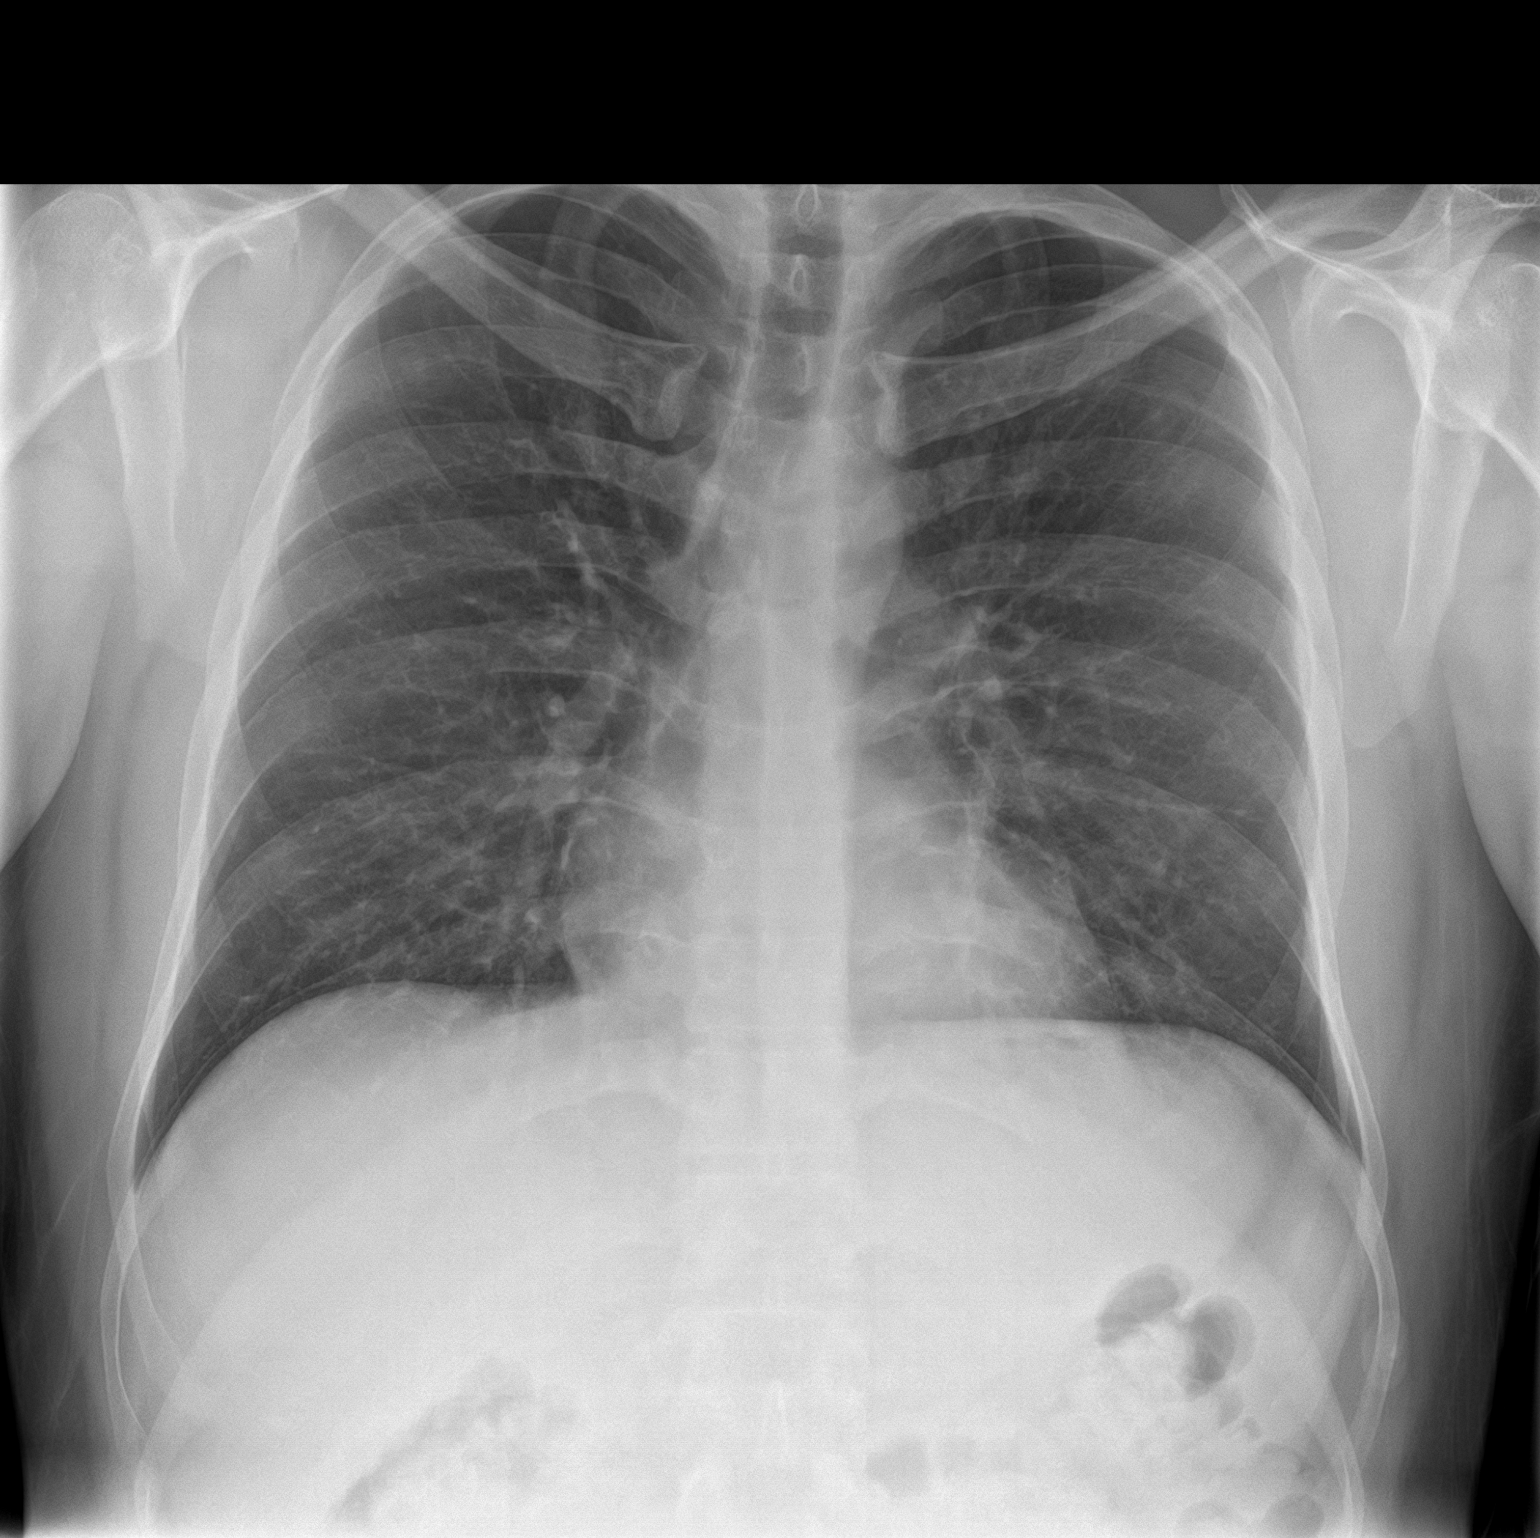

[chest lat]
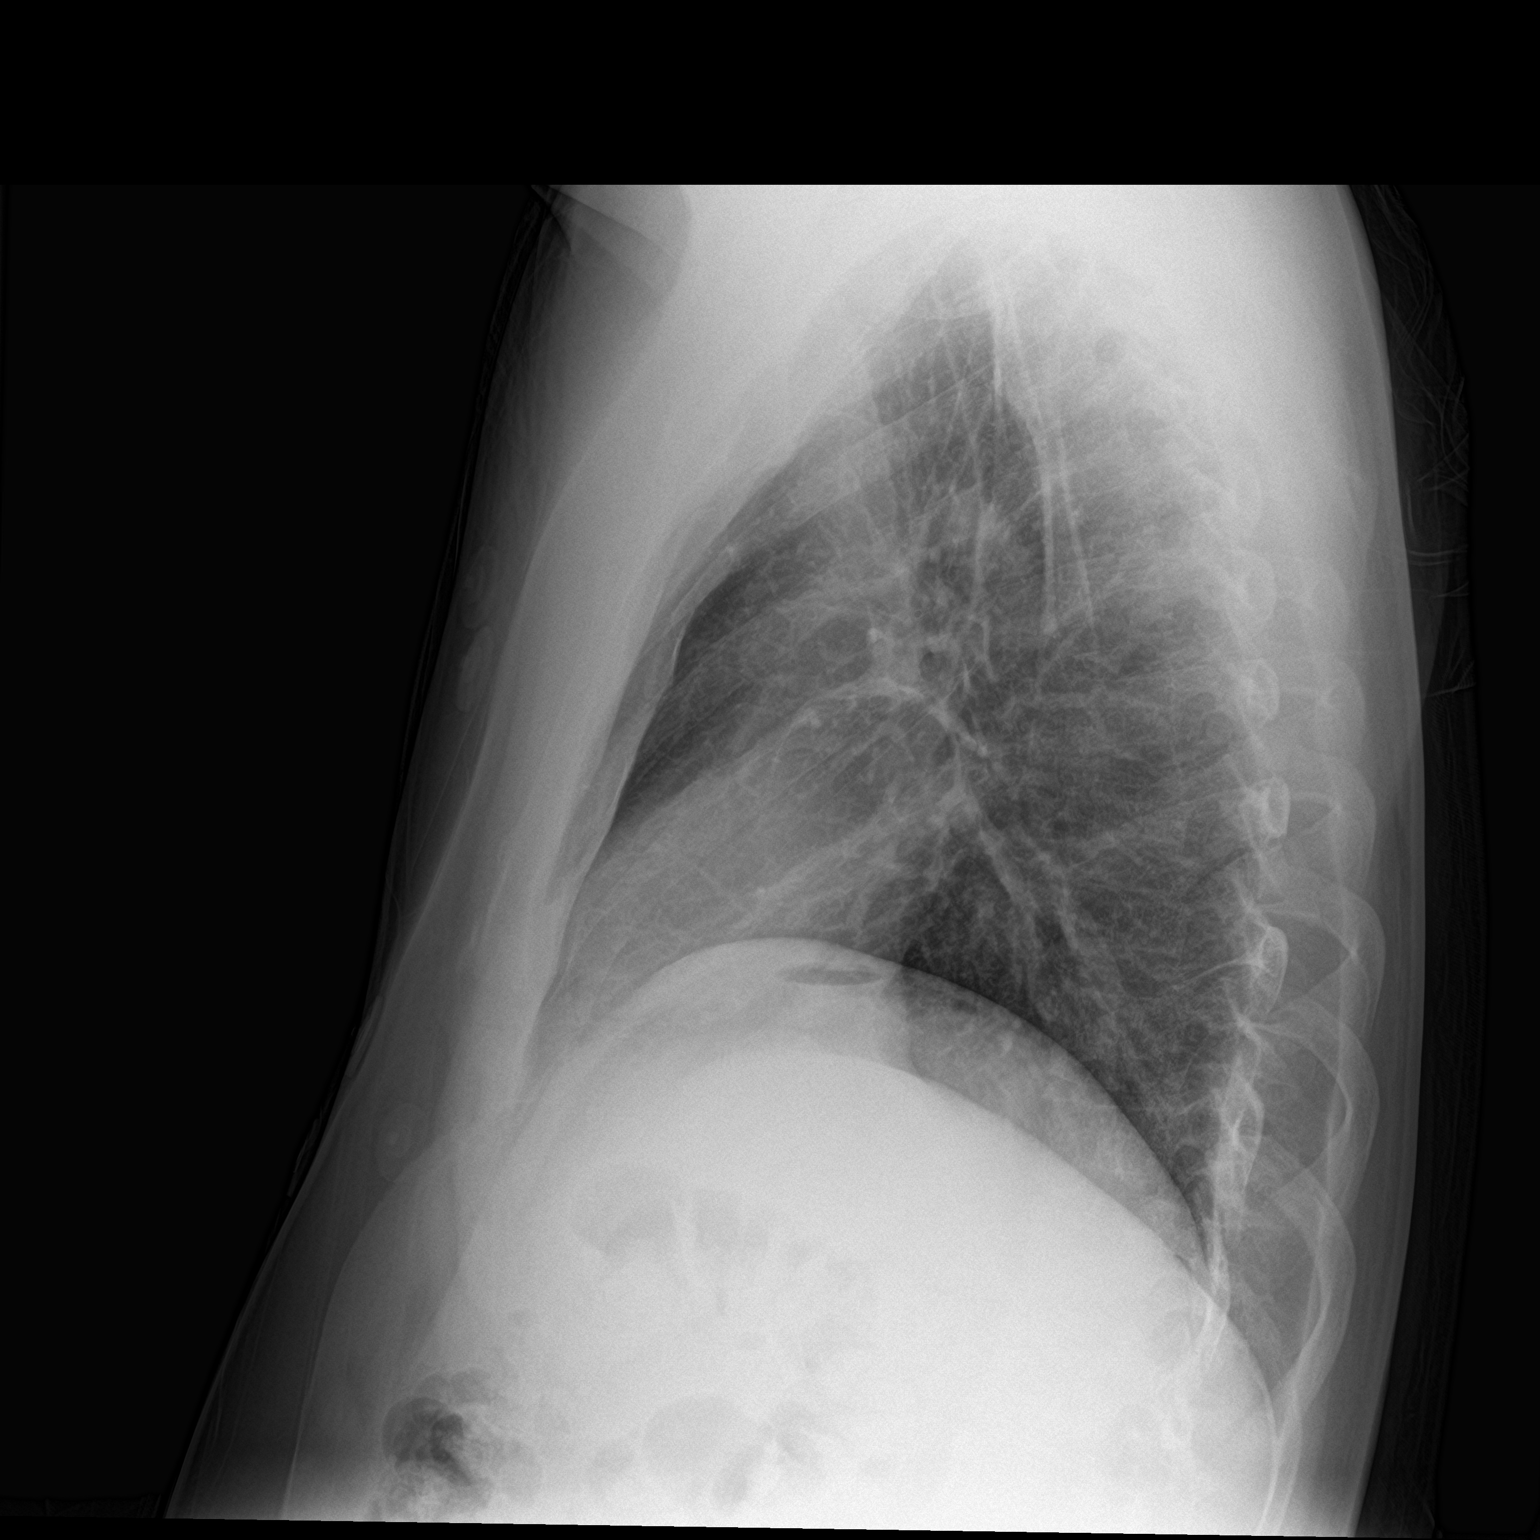

[2 of 2 positions shown; findings below may reference images not displayed]

FINDINGS: The heart size and mediastinal contours are within normal limits.
Both lungs are clear. The visualized skeletal structures are
unremarkable.
IMPRESSION: No active cardiopulmonary disease.

## 2022-03-13 ENCOUNTER — Emergency Department: Payer: Medicaid Other

## 2022-03-13 ENCOUNTER — Other Ambulatory Visit: Payer: Self-pay

## 2022-03-13 ENCOUNTER — Emergency Department
Admission: EM | Admit: 2022-03-13 | Discharge: 2022-03-13 | Disposition: A | Discharge status: Home / Self Care | Payer: Medicaid Other | Attending: Emergency Medicine | Admitting: Emergency Medicine

## 2022-03-13 DIAGNOSIS — M79602 Pain in left arm: Secondary | ICD-10-CM | POA: Insufficient documentation

## 2022-03-13 DIAGNOSIS — S3992XA Unspecified injury of lower back, initial encounter: Secondary | ICD-10-CM | POA: Diagnosis present

## 2022-03-13 DIAGNOSIS — X58XXXA Exposure to other specified factors, initial encounter: Secondary | ICD-10-CM | POA: Insufficient documentation

## 2022-03-13 DIAGNOSIS — R0789 Other chest pain: Secondary | ICD-10-CM | POA: Diagnosis not present

## 2022-03-13 DIAGNOSIS — S39012A Strain of muscle, fascia and tendon of lower back, initial encounter: Secondary | ICD-10-CM | POA: Insufficient documentation

## 2022-03-13 DIAGNOSIS — R079 Chest pain, unspecified: Secondary | ICD-10-CM | POA: Diagnosis not present

## 2022-03-13 DIAGNOSIS — R9431 Abnormal electrocardiogram [ECG] [EKG]: Secondary | ICD-10-CM | POA: Diagnosis not present

## 2022-03-13 LAB — TROPONIN I (HIGH SENSITIVITY): Troponin I (High Sensitivity): <2 ng/L (ref <18)

## 2022-03-13 LAB — URINALYSIS, ROUTINE W REFLEX MICROSCOPIC
Bilirubin Urine: NEGATIVE
Glucose, UA: NEGATIVE mg/dL
Hgb urine dipstick: NEGATIVE
Ketones, ur: NEGATIVE mg/dL
Leukocytes,Ua: NEGATIVE
Nitrite: NEGATIVE
Protein, ur: NEGATIVE mg/dL
Specific Gravity, Urine: 1.026 (ref 1.005–1.030)
pH: 7 (ref 5.0–8.0)

## 2022-03-13 LAB — LIPASE, BLOOD: Lipase: 45 U/L (ref 11–51)

## 2022-03-13 LAB — CBC
HCT: 48 % (ref 39.0–52.0)
Hemoglobin: 15.6 g/dL (ref 13.0–17.0)
MCH: 28.1 pg (ref 26.0–34.0)
MCHC: 32.5 g/dL (ref 30.0–36.0)
MCV: 86.3 fL (ref 80.0–100.0)
Platelets: 219 10*3/uL (ref 150–400)
RBC: 5.56 MIL/uL (ref 4.22–5.81)
RDW: 12.6 % (ref 11.5–15.5)
WBC: 7.7 10*3/uL (ref 4.0–10.5)
nRBC: 0 % (ref 0.0–0.2)

## 2022-03-13 LAB — COMPREHENSIVE METABOLIC PANEL
ALT: 41 U/L (ref 0–44)
AST: 29 U/L (ref 15–41)
Albumin: 4.3 g/dL (ref 3.5–5.0)
Alkaline Phosphatase: 62 U/L (ref 38–126)
Anion gap: 8 (ref 5–15)
BUN: 15 mg/dL (ref 6–20)
CO2: 25 mmol/L (ref 22–32)
Calcium: 9.1 mg/dL (ref 8.9–10.3)
Chloride: 107 mmol/L (ref 98–111)
Creatinine, Ser: 0.94 mg/dL (ref 0.61–1.24)
GFR, Estimated: >60 mL/min (ref >60)
Glucose, Bld: 116 mg/dL — ABNORMAL HIGH (ref 70–99)
Potassium: 4.1 mmol/L (ref 3.5–5.1)
Sodium: 140 mmol/L (ref 135–145)
Total Bilirubin: 0.5 mg/dL (ref 0.3–1.2)
Total Protein: 7.5 g/dL (ref 6.5–8.1)

## 2022-03-13 MED ORDER — MELOXICAM 15 MG PO TABS: 15.0000 mg | ORAL_TABLET | Freq: Every day | ORAL | 0 refills | Status: AC

## 2022-03-13 MED ORDER — CYCLOBENZAPRINE HCL 10 MG PO TABS: 10.0000 mg | ORAL_TABLET | Freq: Three times a day (TID) | ORAL | 0 refills | Status: AC | PRN

## 2022-03-13 NOTE — ED Provider Notes (Signed)
[facility] Provider Note    Event Date/Time   First MD Initiated Contact with Patient 03/13/22 1637     (approximate)   History   Chief Complaint Arm Pain   HPI Patrick Huffman is a 31 y.o. male, no significant medical history, presents with several issues of concern.  He states that he has been having some left-sided chest pain with extension into his left upper arm for the past week.  Denies any recent falls or injuries.  He states that he has had similar episodes of this happening, though usually has negative work-ups.  In addition, he states that he has this yellowish discoloration along the palmar surface of his finger/hand.  He is unsure what caused this, but is concerned for liver failure/jaundice.  He is additionally have an lower right back pain.  He states that it hurts whenever he bends over or twist/moves.  Denies any acute injuries, but has been working frequently at a car wash.  Lastly, he states that he would like a urinalysis because he is concerned for "protein" in his urine.  Denies fever/chills, shortness of breath, abdominal pain, flank pain, nausea/vomiting, diarrhea, dysuria, headache, weakness, vision change, hearing changes, rash/lesions, numbness/tingling in extremities, or dizziness/lightheadedness.  History Limitations: No limitations.        Physical Exam  Triage Vital Signs: ED Triage Vitals  Enc Vitals Group     BP 03/13/22 1512 (!) 136/101     Pulse Rate 03/13/22 1512 78     Resp 03/13/22 1512 18     Temp 03/13/22 1513 98 F (36.7 C)     Temp Source 03/13/22 1513 Oral     SpO2 03/13/22 1512 96 %     Weight --      Height --      Head Circumference --      Peak Flow --      Pain Score 03/13/22 1512 7     Pain Loc --      Pain Edu? --      Excl. in GC? --     Most recent vital signs: Vitals:   03/13/22 1512 03/13/22 1513  BP: (!) 136/101   Pulse: 78   Resp: 18   Temp:  98 F (36.7 C)  SpO2: 96%      General: Awake, NAD.  Skin: Warm, dry. No rashes or lesions.  Eyes: PERRL. Conjunctivae normal.  CV: Good peripheral perfusion.  S1 and S2 present.  No murmurs, rubs, or gallops. Resp: Normal effort.  Lung sounds are clear bilaterally. Abd: Soft, non-tender. No distention.  Neuro: At baseline. No gross neurological deficits.  Musculoskeletal: Normal ROM of all extremities.  Focused Exam: Some chest wall tenderness along the lateral aspect of the left chest, particular around ribs 5 through 6.  No gross deformities in the left upper extremity or point tenderness along the area of concern.  PMS intact distally.  He does appear to have some yellow discoloration along the palmar surface of his digits in the palm of his hand.  It does not extend anywhere else.  With soap and water, I was able to get some of this material off which I suspect may be dirt?  There is still some residual discoloration following the wash.  Normal cap refill in all digits.  No midline spinal tenderness.  Significant tightening and knots noted on the lower right lumbar region.  Decided to ambulate and maintains full range of motion.  Physical  Exam    ED Results / Procedures / Treatments  Labs (all labs ordered are listed, but only abnormal results are displayed) Labs Reviewed  COMPREHENSIVE METABOLIC PANEL - Abnormal; Notable for the following components:      Result Value   Glucose, Bld 116 (*)    All other components within normal limits  URINALYSIS, ROUTINE W REFLEX MICROSCOPIC - Abnormal; Notable for the following components:   Color, Urine YELLOW (*)    APPearance CLEAR (*)    All other components within normal limits  LIPASE, BLOOD  CBC  TROPONIN I (HIGH SENSITIVITY)  TROPONIN I (HIGH SENSITIVITY)     EKG Sinus rhythm, rate of 63, no T-segment changes, no axis deviations, normal QRS, no QT prolongation.    RADIOLOGY  ED Provider Interpretation: I personally reviewed and interpreted this  x-ray, no evidence of acute cardiopulmonary process.  DG Chest 2 View  Result Date: 03/13/2022 CLINICAL DATA:  Chest pain, pain in right arm and left shoulder EXAM: CHEST - 2 VIEW COMPARISON:  09/15/2021 FINDINGS: Frontal and lateral views of the chest demonstrate an unremarkable cardiac silhouette. No airspace disease, effusion, or pneumothorax. No acute bony abnormalities. IMPRESSION: 1. Stable chest, no acute process. Electronically Signed   By: Sharlet Salina M.D.   On: 03/13/2022 17:17    PROCEDURES:  Critical Care performed: N/A.  Procedures    MEDICATIONS ORDERED IN ED: Medications - No data to display   IMPRESSION / MDM / ASSESSMENT AND PLAN / ED COURSE  I reviewed the triage vital signs and the nursing notes.                              Differential diagnosis includes, but is not limited to, ACS, jaundice, costochondritis, musculoskeletal strain, lumbar strain, cystitis, proteinuria, dehydration.  ED Course Patient appears well, vitals within normal limits.  NAD.  CBC shows no leukocytosis, anemia, or thrombocytopenia.  Lipase unremarkable at 45.  CMP shows no electrolyte abnormalities, transaminitis, or AKI.  Initial troponin less than 2.  Assessment/Plan Patient presents with concerns for multiple issues, including hand discoloration, chest pain/upper arm pain, and concern for protein in his urine.  In regards to the hand discoloration, it appears to be localized to the palmar aspect of his hand.  He denies any environmental exposures, however with soap and water was able to clear off some amount of yellow substance.  Suspect possible chemical or environmental exposure.  It is not causing him any pain at this time.  His initial concern was for jaundice, however I did provide him reassurance, as his liver enzymes look well and he does not appear to have any yellow discoloration anywhere else on his body.  He was reassured by my findings.  In regards to his  chest/upper arm pain, his physical exam is unremarkable.  EKG does not show any significant findings.  Troponin levels are unremarkable.  He is PERC negative.  He has had cardiac workups in the past, which have been negative.  Check x-ray does not show any acute findings.  He does appear to have some chest wall tenderness along the lateral aspect of his chest, possible intercostal muscle strain or other musculoskeletal pathology.  However, very low suspicion for any serious or life-threatening pathology.  Encouraged him to monitor symptoms and take meloxicam/cyclobenzaprine as needed.  Additionally spoke with him at length about the possibility of anxiety being a potential factor exacerbating his pain.  Recommend that he follow-up with his regular doctor.  In regard to a concern for protein in his urine, his urinalysis here does not show any proteinuria or evidence of infection.  He is not having urinary symptoms at this time.  No further workup indicated at this time.  Will discharge this patient.  Encouraged him to follow-up with his primary care provider as needed.   Provided the patient with anticipatory guidance, return precautions, and educational material. Encouraged the patient to return to the emergency department at any time if they begin to experience any new or worsening symptoms. Patient expressed understanding and agreed with the plan.   Patient's presentation is most consistent with acute complicated illness / injury requiring diagnostic workup.      FINAL CLINICAL IMPRESSION(S) / ED DIAGNOSES   Final diagnoses:  Pain of left upper extremity  Lumbar strain, initial encounter     Rx / DC Orders   ED Discharge Orders          Ordered    meloxicam (MOBIC) 15 MG tablet  Daily        03/13/22 1832    cyclobenzaprine (FLEXERIL) 10 MG tablet  3 times daily PRN        03/13/22 1832             Note:  This document was prepared using Dragon voice recognition software and  may include unintentional dictation errors.   Varney Daily, [place] 03/13/22 4818    Corena Herter, MD 03/14/22 2112

## 2022-03-13 NOTE — Discharge Instructions (Addendum)
-  We are unsure of the source of your chest pain/arm pain at this time, however it does not appear to be anything serious or life-threatening.  Please follow-up with your regular doctor.  -In regards to the discoloration on your hand, please continue to wash with soap and water.  If the discoloration spreads, please follow-up with your regular doctor or return here to the emergency department.  -In regards to your back pain, I suspect that you have a lumbar strain.  Please review the educational material provided.  You may take meloxicam and cyclobenzaprine as needed for your symptoms.  Be careful with the cyclobenzaprine, as it may make you dizzy such as a.  -Return to the emergency department anytime if you begin to experience any new or worsening symptoms.

## 2022-03-13 NOTE — ED Triage Notes (Addendum)
Pt comes with c/o arm pain and hand pain. Pt denies any known injuries. Pt states his left hand is yellow in color. Pt states pain in right arm and left shoulder.

## 2022-03-19 ENCOUNTER — Emergency Department
Admission: EM | Admit: 2022-03-19 | Discharge: 2022-03-19 | Disposition: A | Discharge status: Home / Self Care | Payer: Medicaid Other | Attending: Emergency Medicine | Admitting: Emergency Medicine

## 2022-03-19 NOTE — ED Notes (Signed)
This RN in room for triage. Pt asked if he would have to wait after triage was done. Explained that he would have to wait to see a physician. States he was going up the steps to L&D and states that nursing staff in the stairwell told him he needed to be seen in the ED after he told them that he did not want to come here.. Pt states he does not want to be seen and wants to go back upstairs with his wife and child. First RN, Marylene Land made aware.

## 2022-03-30 DIAGNOSIS — R519 Headache, unspecified: Secondary | ICD-10-CM | POA: Diagnosis not present

## 2022-03-30 DIAGNOSIS — Z20822 Contact with and (suspected) exposure to covid-19: Secondary | ICD-10-CM | POA: Diagnosis not present

## 2022-03-30 DIAGNOSIS — F17210 Nicotine dependence, cigarettes, uncomplicated: Secondary | ICD-10-CM | POA: Diagnosis not present

## 2022-03-30 DIAGNOSIS — Z1152 Encounter for screening for COVID-19: Secondary | ICD-10-CM | POA: Diagnosis not present

## 2022-03-30 DIAGNOSIS — R058 Other specified cough: Secondary | ICD-10-CM | POA: Diagnosis not present

## 2022-03-30 DIAGNOSIS — R079 Chest pain, unspecified: Secondary | ICD-10-CM | POA: Diagnosis not present

## 2022-03-30 DIAGNOSIS — R0789 Other chest pain: Secondary | ICD-10-CM | POA: Diagnosis not present

## 2022-03-30 DIAGNOSIS — R9431 Abnormal electrocardiogram [ECG] [EKG]: Secondary | ICD-10-CM | POA: Diagnosis not present

## 2022-03-30 DIAGNOSIS — R5381 Other malaise: Secondary | ICD-10-CM | POA: Diagnosis not present

## 2022-03-30 DIAGNOSIS — J40 Bronchitis, not specified as acute or chronic: Secondary | ICD-10-CM | POA: Diagnosis not present

## 2022-03-30 DIAGNOSIS — R1013 Epigastric pain: Secondary | ICD-10-CM | POA: Diagnosis not present

## 2022-03-30 DIAGNOSIS — R0602 Shortness of breath: Secondary | ICD-10-CM | POA: Diagnosis not present

## 2022-03-30 DIAGNOSIS — I445 Left posterior fascicular block: Secondary | ICD-10-CM | POA: Diagnosis not present

## 2022-03-31 DIAGNOSIS — R079 Chest pain, unspecified: Secondary | ICD-10-CM | POA: Diagnosis not present

## 2022-04-01 DIAGNOSIS — R9431 Abnormal electrocardiogram [ECG] [EKG]: Secondary | ICD-10-CM | POA: Diagnosis not present

## 2022-04-30 DIAGNOSIS — R0981 Nasal congestion: Secondary | ICD-10-CM | POA: Diagnosis not present

## 2022-04-30 DIAGNOSIS — F17210 Nicotine dependence, cigarettes, uncomplicated: Secondary | ICD-10-CM | POA: Diagnosis not present

## 2022-04-30 DIAGNOSIS — R519 Headache, unspecified: Secondary | ICD-10-CM | POA: Diagnosis not present

## 2022-06-07 DIAGNOSIS — N3000 Acute cystitis without hematuria: Secondary | ICD-10-CM | POA: Diagnosis not present

## 2022-06-07 DIAGNOSIS — J320 Chronic maxillary sinusitis: Secondary | ICD-10-CM | POA: Diagnosis not present

## 2022-06-07 DIAGNOSIS — R519 Headache, unspecified: Secondary | ICD-10-CM | POA: Diagnosis not present

## 2022-06-07 DIAGNOSIS — F17210 Nicotine dependence, cigarettes, uncomplicated: Secondary | ICD-10-CM | POA: Diagnosis not present

## 2022-08-15 DIAGNOSIS — M25512 Pain in left shoulder: Secondary | ICD-10-CM | POA: Diagnosis not present

## 2022-08-15 DIAGNOSIS — F17210 Nicotine dependence, cigarettes, uncomplicated: Secondary | ICD-10-CM | POA: Diagnosis not present

## 2022-08-15 DIAGNOSIS — R200 Anesthesia of skin: Secondary | ICD-10-CM | POA: Diagnosis not present

## 2022-08-15 DIAGNOSIS — R1013 Epigastric pain: Secondary | ICD-10-CM | POA: Diagnosis not present

## 2022-08-15 DIAGNOSIS — M79602 Pain in left arm: Secondary | ICD-10-CM | POA: Diagnosis not present

## 2022-08-15 DIAGNOSIS — R059 Cough, unspecified: Secondary | ICD-10-CM | POA: Diagnosis not present

## 2022-08-15 DIAGNOSIS — Z1152 Encounter for screening for COVID-19: Secondary | ICD-10-CM | POA: Diagnosis not present

## 2022-08-15 DIAGNOSIS — J069 Acute upper respiratory infection, unspecified: Secondary | ICD-10-CM | POA: Diagnosis not present

## 2022-08-15 DIAGNOSIS — R0789 Other chest pain: Secondary | ICD-10-CM | POA: Diagnosis not present

## 2022-08-15 DIAGNOSIS — Z20822 Contact with and (suspected) exposure to covid-19: Secondary | ICD-10-CM | POA: Diagnosis not present

## 2022-08-15 DIAGNOSIS — R519 Headache, unspecified: Secondary | ICD-10-CM | POA: Diagnosis not present

## 2022-08-16 ENCOUNTER — Encounter: Payer: Self-pay | Admitting: Emergency Medicine

## 2022-08-16 ENCOUNTER — Other Ambulatory Visit: Payer: Self-pay

## 2022-08-16 ENCOUNTER — Emergency Department
Admission: EM | Admit: 2022-08-16 | Discharge: 2022-08-16 | Disposition: A | Discharge status: Home / Self Care | Payer: Medicaid Other | Attending: Emergency Medicine | Admitting: Emergency Medicine

## 2022-08-16 ENCOUNTER — Emergency Department: Payer: Medicaid Other

## 2022-08-16 DIAGNOSIS — J0100 Acute maxillary sinusitis, unspecified: Secondary | ICD-10-CM | POA: Diagnosis not present

## 2022-08-16 DIAGNOSIS — M79602 Pain in left arm: Secondary | ICD-10-CM | POA: Diagnosis not present

## 2022-08-16 DIAGNOSIS — R200 Anesthesia of skin: Secondary | ICD-10-CM | POA: Diagnosis not present

## 2022-08-16 DIAGNOSIS — Z20822 Contact with and (suspected) exposure to covid-19: Secondary | ICD-10-CM | POA: Diagnosis not present

## 2022-08-16 DIAGNOSIS — R0600 Dyspnea, unspecified: Secondary | ICD-10-CM | POA: Diagnosis not present

## 2022-08-16 DIAGNOSIS — R0789 Other chest pain: Secondary | ICD-10-CM | POA: Diagnosis not present

## 2022-08-16 DIAGNOSIS — M25512 Pain in left shoulder: Secondary | ICD-10-CM | POA: Diagnosis not present

## 2022-08-16 DIAGNOSIS — R091 Pleurisy: Secondary | ICD-10-CM | POA: Diagnosis not present

## 2022-08-16 DIAGNOSIS — R519 Headache, unspecified: Secondary | ICD-10-CM | POA: Diagnosis not present

## 2022-08-16 DIAGNOSIS — R059 Cough, unspecified: Secondary | ICD-10-CM | POA: Diagnosis not present

## 2022-08-16 DIAGNOSIS — J069 Acute upper respiratory infection, unspecified: Secondary | ICD-10-CM | POA: Diagnosis not present

## 2022-08-16 DIAGNOSIS — R1013 Epigastric pain: Secondary | ICD-10-CM | POA: Diagnosis not present

## 2022-08-16 DIAGNOSIS — R079 Chest pain, unspecified: Secondary | ICD-10-CM | POA: Diagnosis not present

## 2022-08-16 DIAGNOSIS — F17210 Nicotine dependence, cigarettes, uncomplicated: Secondary | ICD-10-CM | POA: Diagnosis not present

## 2022-08-16 LAB — BASIC METABOLIC PANEL
Anion gap: 11 (ref 5–15)
BUN: 15 mg/dL (ref 6–20)
CO2: 24 mmol/L (ref 22–32)
Calcium: 8.8 mg/dL — ABNORMAL LOW (ref 8.9–10.3)
Chloride: 103 mmol/L (ref 98–111)
Creatinine, Ser: 1.07 mg/dL (ref 0.61–1.24)
GFR, Estimated: >60 mL/min (ref >60)
Glucose, Bld: 110 mg/dL — ABNORMAL HIGH (ref 70–99)
Potassium: 4.2 mmol/L (ref 3.5–5.1)
Sodium: 138 mmol/L (ref 135–145)

## 2022-08-16 LAB — CBC
HCT: 46.9 % (ref 39.0–52.0)
Hemoglobin: 15.1 g/dL (ref 13.0–17.0)
MCH: 28.4 pg (ref 26.0–34.0)
MCHC: 32.2 g/dL (ref 30.0–36.0)
MCV: 88.3 fL (ref 80.0–100.0)
Platelets: 247 10*3/uL (ref 150–400)
RBC: 5.31 MIL/uL (ref 4.22–5.81)
RDW: 12.4 % (ref 11.5–15.5)
WBC: 8.8 10*3/uL (ref 4.0–10.5)
nRBC: 0 % (ref 0.0–0.2)

## 2022-08-16 LAB — TROPONIN I (HIGH SENSITIVITY): Troponin I (High Sensitivity): 3 ng/L (ref <18)

## 2022-08-16 MED ORDER — AMOXICILLIN-POT CLAVULANATE 875-125 MG PO TABS: 1.0000 | ORAL_TABLET | Freq: Two times a day (BID) | ORAL | 0 refills | Status: AC

## 2022-08-16 MED ORDER — AMOXICILLIN-POT CLAVULANATE 875-125 MG PO TABS
1.0000 | ORAL_TABLET | Freq: Once | ORAL | Status: AC
  Administered 2022-08-16: 1 via ORAL
  Filled 2022-08-16: qty 1

## 2022-08-16 NOTE — ED Provider Notes (Signed)
[facility] Provider Note    Event Date/Time   First MD Initiated Contact with Patient 08/16/22 [phone]     (approximate)   History   Chest Pain   HPI  Patrick Huffman is a 32 y.o. male reports no major past medical history aside from a prior diagnosis of fatty liver  For about 5 days now he has been experiencing a sore throat, runny nose, headaches, cough.  For the last 2 to 3 days has been experiencing a intermittent pain in the left side of his chest, worsened with coughing.  Symptoms shoots pain up towards his left shoulder and left upper chest.  Feeling hot and cold off and on.  He went to [facility] hospitals then was evaluated yesterday, he reports that they discharged him rather abruptly and he is not really given a clear diagnosis.  Today he continues to endorse same symptoms, ongoing pain with coughing, achiness over the left side of the chest.  Now he is also developing pain over the left side of his face and is noticed green drainage from his left nostril.  Denies any history of cardiac disease.  No history of blood clots, no long trips or travel, no surgeries, no leg swelling     Physical Exam   Triage Vital Signs: ED Triage Vitals [08/16/22 0309]  Enc Vitals Group     BP (!) 130/95     Pulse Rate 88     Resp 20     Temp 98.2 F (36.8 C)     Temp Source Oral     SpO2 98 %     Weight 184 lb 15.5 oz (83.9 kg)     Height 6' (1.829 m)     Head Circumference      Peak Flow      Pain Score 6     Pain Loc      Pain Edu?      Excl. in GC?     Most recent vital signs: Vitals:   08/16/22 0309  BP: (!) 130/95  Pulse: 88  Resp: 20  Temp: 98.2 F (36.8 C)  SpO2: 98%     General: Awake, no distress.  Very pleasant.  Alert well-oriented Posterior oropharynx slightly injected, no exudates or tonsillar exudates. Occasional dry cough.  Speaks in full clear sentences without distress CV:  Good peripheral perfusion.  Normal tones and rate.   No rub Resp:  Normal effort.  Clear bilaterally Abd:  No distention.  Soft nontender nondistended Other:  clear coryza on the right side.  Left nostril demonstrates mild purulence   ED Results / Procedures / Treatments   Labs (all labs ordered are listed, but only abnormal results are displayed) Labs Reviewed  BASIC METABOLIC PANEL - Abnormal; Notable for the following components:      Result Value   Glucose, Bld 110 (*)    Calcium 8.8 (*)    All other components within normal limits  CBC  TROPONIN I (HIGH SENSITIVITY)  TROPONIN I (HIGH SENSITIVITY)     EKG  ED ECG REPORT I, Sharyn Creamer, the attending physician, personally viewed and interpreted this ECG.  Date: 08/16/2022 EKG Time: 545 Rate: 75 Rhythm: normal sinus rhythm QRS Axis: normal Intervals: normal ST/T Wave abnormalities: normal Narrative Interpretation: no evidence of acute ischemia    RADIOLOGY  Chest x-ray interpreted by me as negative for acute finding  DG Chest 2 View  Result Date: 08/16/2022 CLINICAL DATA:  Chest  pain, dyspnea EXAM: CHEST - 2 VIEW COMPARISON:  03/13/2022 FINDINGS: Nodular opacity within the medial right upper lobe overlying the right fifth rib posteriorly corresponds to hypertrophic changes involving the right first rib anteriorly better seen on CT examination of 04/14/2020. Lungs are otherwise clear. No pneumothorax or pleural effusion. Cardiac size within normal limits. Pulmonary vascularity is normal. No acute bone abnormality. IMPRESSION: 1. No active cardiopulmonary disease. Electronically Signed   By: Helyn Numbers M.D.   On: 08/16/2022 03:34      PROCEDURES:  Critical Care performed: No  Procedures   MEDICATIONS ORDERED IN ED: Medications  amoxicillin-clavulanate (AUGMENTIN) 875-125 MG per tablet 1 tablet (has no administration in time range)     IMPRESSION / MDM / ASSESSMENT AND PLAN / ED COURSE  I reviewed the triage vital signs and the nursing notes.                               Differential diagnosis includes, but is not limited to, viral syndrome, pharyngitis, pleurisy, obtain chest x-ray to evaluate for pneumonia, pneumothorax, or other lesion in the chest that seems unlikely, pericarditis myocarditis, etc.  He does have clinical findings suggestive of acute bacterial sinus infection likely precipitated by preceding upper respiratory illness.  Reviewed records from [facility] yesterday, he had negative flu and COVID testing, also a negative cardiac workup at that time.  His symptoms are not necessarily typical of pericarditis.  At times alleviated by laying down not improved by sitting up.  Seems he is experiencing at least element of pleurisy involving the left upper chest.  Workup here quite reassuring no CBC chest x-ray cardiac testing EKG all without acute finding.  No risk factors for PE.  Discussed with patient, and will treat with Augmentin for concerns of a mild to moderate developing sinusitis.  He is comfortable agreeable with this plan.  Discussed return precautions and treatment recommendations.  Patient's presentation is most consistent with acute complicated illness / injury requiring diagnostic workup.      Pulmonary Embolism Rule-out Criteria (PERC rule)                        If YES to ANY of the following, the PERC rule is not satisfied and cannot be used to rule out PE in this patient (consider d-dimer or imaging depending on pre-test probability).                      If NO to ALL of the following, AND the clinician's pre-test probability is <15%, the [facility] rule is satisfied and there is no need for further workup (including no need to obtain a d-dimer) as the post-test probability of pulmonary embolism is <2%.                      Mnemonic is HAD CLOTS   H - hormone use (exogenous estrogen)      No. A - age > 50                                                 No. D - DVT/PE history  No.   C -  coughing blood (hemoptysis)                 No. L - leg swelling, unilateral                             No. O - O2 Sat on Room Air < 95%                  No. T - tachycardia (HR ? 100)                         No. S - surgery or trauma, recent                      No.   Normal CBC and metabolic panel.  Normal troponin.  Symptoms very atypical no risk factors for coronary disease to noted.  Atypical.  No indication for second troponin.      FINAL CLINICAL IMPRESSION(S) / ED DIAGNOSES   Final diagnoses:  Acute maxillary sinusitis, recurrence not specified  Viral pleurisy     Rx / DC Orders   ED Discharge Orders          Ordered    amoxicillin-clavulanate (AUGMENTIN) 875-125 MG tablet  2 times daily        08/16/22 0540             Note:  This document was prepared using Dragon voice recognition software and may include unintentional dictation errors.   Sharyn Creamer, MD 08/16/22 [phone]

## 2022-08-16 NOTE — ED Triage Notes (Signed)
Pt to ED via POV with c/o substernal CP. Pt states initially started with sore throat, then started having HA x 3 days, then L shoulder blade started hurting and L arm went numb then substernal CP x 2 days. Pt also c/o SHOB and cold sweats. Pt states looked up symptoms online and per his online search, found out his symptoms were warning signs of a heart attack later on. Pt states he knows he is not having a heart attack right now, is adamant about refusing an EKG in triage and repeatedly states that he just wants X-ray and blood work in triage. Pt adamantly refusing EKG in triage.   Pt states took 3 81mg  ASA and tums PTA without relief.

## 2022-09-02 ENCOUNTER — Other Ambulatory Visit: Payer: Self-pay

## 2022-09-02 ENCOUNTER — Emergency Department
Admission: EM | Admit: 2022-09-02 | Discharge: 2022-09-02 | Disposition: A | Discharge status: Home / Self Care | Payer: Medicaid Other | Attending: Emergency Medicine | Admitting: Emergency Medicine

## 2022-09-02 ENCOUNTER — Emergency Department: Payer: Medicaid Other

## 2022-09-02 ENCOUNTER — Encounter: Payer: Self-pay | Admitting: Emergency Medicine

## 2022-09-02 DIAGNOSIS — J0120 Acute ethmoidal sinusitis, unspecified: Secondary | ICD-10-CM

## 2022-09-02 DIAGNOSIS — R519 Headache, unspecified: Secondary | ICD-10-CM | POA: Diagnosis not present

## 2022-09-02 MED ORDER — SODIUM CHLORIDE 0.9 % IV BOLUS
1000.0000 mL | Freq: Once | INTRAVENOUS | Status: AC
  Administered 2022-09-02: 1000 mL via INTRAVENOUS

## 2022-09-02 MED ORDER — KETOROLAC TROMETHAMINE 30 MG/ML IJ SOLN
15.0000 mg | Freq: Once | INTRAMUSCULAR | Status: AC
  Administered 2022-09-02: 15 mg via INTRAVENOUS
  Filled 2022-09-02: qty 1

## 2022-09-02 MED ORDER — METOCLOPRAMIDE HCL 5 MG/ML IJ SOLN
10.0000 mg | Freq: Once | INTRAMUSCULAR | Status: AC
  Administered 2022-09-02: 10 mg via INTRAVENOUS
  Filled 2022-09-02: qty 2

## 2022-09-02 MED ORDER — PROCHLORPERAZINE EDISYLATE 10 MG/2ML IJ SOLN
10.0000 mg | Freq: Once | INTRAMUSCULAR | Status: AC
  Administered 2022-09-02: 10 mg via INTRAVENOUS
  Filled 2022-09-02: qty 2

## 2022-09-02 NOTE — ED Triage Notes (Signed)
Pt via POV from home. Pt c/o headache started on front L side of his forehead and radiated to his L temple and down the L side of his neck. Pt does endorse nausea. Pt previously dx with a sinus infection and just recently finished his course of Amoxicillin. Denies hx of migraine. Pt is A&OX4 and NAD

## 2022-09-02 NOTE — ED Provider Notes (Signed)
[facility] Provider Note    Event Date/Time   First MD Initiated Contact with Patient 09/02/22 1211     (approximate)   History   Headache and Neck Pain   HPI  Patrick Huffman is a 32 y.o. male with no significant past medical history and as listed in EMR presents to the emergency department for treatment and evaluation of sinus pain, headache, and left-sided neck pain.  He has been on Augmentin for sinus infection but does not feel that it has completely resolved.  He continues to have a headache.  Today the headache is in the mid forehead and radiates to the left temporal area, behind the left ear, and on the left side of the neck.  He has also had some mild nausea but no vomiting.  He has been evaluated symptoms for similar symptoms but does not feel that he has had appropriate testing or resolution of his symptoms.  He has taken over-the-counter medications today without any relief.      Physical Exam   Triage Vital Signs: ED Triage Vitals [09/02/22 1055]  Enc Vitals Group     BP (!) 144/89     Pulse Rate 88     Resp 18     Temp 97.6 F (36.4 C)     Temp Source Oral     SpO2 98 %     Weight 205 lb (93 kg)     Height 6' (1.829 m)     Head Circumference      Peak Flow      Pain Score 7     Pain Loc      Pain Edu?      Excl. in GC?     Most recent vital signs: Vitals:   09/02/22 1055  BP: (!) 144/89  Pulse: 88  Resp: 18  Temp: 97.6 F (36.4 C)  SpO2: 98%    General: Awake, no distress.  CV:  Good peripheral perfusion.  Resp:  Normal effort.  Abd:  No distention.  Other:  Negative finger-to-nose testing.   Negative Rhomberg's   Balance is normal.   Gait is steady and coordinated.    ED Results / Procedures / Treatments   Labs (all labs ordered are listed, but only abnormal results are displayed) Labs Reviewed - No data to display   EKG  Not indicated.   RADIOLOGY  Image and radiology report reviewed and  interpreted by me. Radiology report consistent with the same.  No acute intracranial abnormality. Left maxillary and scattered ethmoid air cell mucosal thickening, potentially sinusitis.  Superficial cystic lesion of the left cheek.  PROCEDURES:  Critical Care performed: No  Procedures   MEDICATIONS ORDERED IN ED:  Medications  ketorolac (TORADOL) 30 MG/ML injection 15 mg (15 mg Intravenous Given 09/02/22 1332)  prochlorperazine (COMPAZINE) injection 10 mg (10 mg Intravenous Given 09/02/22 1332)  metoCLOPramide (REGLAN) injection 10 mg (10 mg Intravenous Given 09/02/22 1332)  sodium chloride 0.9 % bolus 1,000 mL (0 mLs Intravenous Stopped 09/02/22 1351)     IMPRESSION / MDM / ASSESSMENT AND PLAN / ED COURSE   I have reviewed the triage note.  Differential diagnosis includes, but is not limited to, migraine, sinusitis, TIA/CVA, ICH  Patient's presentation is most consistent with acute presentation with potential threat to life or bodily function.  32 year old male presenting to the emergency department for treatment and evaluation of ongoing sinus pain and headache.  See HPI for further details.  Outside chart for the past several visits completed.  It appears that he has had labs, EKG, chest x-ray but no imaging of his head, maxillofacial area or cervical spine.  Due to his continuing complaint of headache, CT of the head and maxillofacial area will be obtained today.  He will be given a migraine cocktail as well.  Patient is aware and is agreeable to this plan.  Headache improved after medications and fluids.  Patient asking for IV to be removed and discharged home. He was advised to continue his medications as previously prescribed and follow up with PCP if headache returns.      FINAL CLINICAL IMPRESSION(S) / ED DIAGNOSES   Final diagnoses:  Subacute ethmoidal sinusitis  Sinus headache     Rx / DC Orders   ED Discharge Orders     None        Note:  This document  was prepared using Dragon voice recognition software and may include unintentional dictation errors.   Chinita Pester, FNP 09/02/22 1359    Minna Antis, MD 09/02/22 1443

## 2022-09-11 ENCOUNTER — Other Ambulatory Visit: Payer: Self-pay

## 2022-09-11 ENCOUNTER — Emergency Department: Payer: Medicaid Other

## 2022-09-11 ENCOUNTER — Encounter: Payer: Self-pay | Admitting: Emergency Medicine

## 2022-09-11 DIAGNOSIS — R0789 Other chest pain: Secondary | ICD-10-CM | POA: Insufficient documentation

## 2022-09-11 DIAGNOSIS — Z5321 Procedure and treatment not carried out due to patient leaving prior to being seen by health care provider: Secondary | ICD-10-CM | POA: Insufficient documentation

## 2022-09-11 DIAGNOSIS — R079 Chest pain, unspecified: Secondary | ICD-10-CM | POA: Diagnosis not present

## 2022-09-11 LAB — BASIC METABOLIC PANEL
Anion gap: 8 (ref 5–15)
BUN: 14 mg/dL (ref 6–20)
CO2: 27 mmol/L (ref 22–32)
Calcium: 8.8 mg/dL — ABNORMAL LOW (ref 8.9–10.3)
Chloride: 100 mmol/L (ref 98–111)
Creatinine, Ser: 0.97 mg/dL (ref 0.61–1.24)
GFR, Estimated: >60 mL/min (ref >60)
Glucose, Bld: 107 mg/dL — ABNORMAL HIGH (ref 70–99)
Potassium: 4 mmol/L (ref 3.5–5.1)
Sodium: 135 mmol/L (ref 135–145)

## 2022-09-11 LAB — CBC
HCT: 45 % (ref 39.0–52.0)
Hemoglobin: 14.9 g/dL (ref 13.0–17.0)
MCH: 28.7 pg (ref 26.0–34.0)
MCHC: 33.1 g/dL (ref 30.0–36.0)
MCV: 86.5 fL (ref 80.0–100.0)
Platelets: 216 10*3/uL (ref 150–400)
RBC: 5.2 MIL/uL (ref 4.22–5.81)
RDW: 12.4 % (ref 11.5–15.5)
WBC: 8.8 10*3/uL (ref 4.0–10.5)
nRBC: 0 % (ref 0.0–0.2)

## 2022-09-11 LAB — TROPONIN I (HIGH SENSITIVITY): Troponin I (High Sensitivity): 3 ng/L (ref <18)

## 2022-09-11 NOTE — ED Notes (Signed)
Pt requested IV be removed.

## 2022-09-11 NOTE — ED Triage Notes (Addendum)
Chest pain 1 hour ago was driving to ED and  Felt like donkey kicked in chest and radiated down arm. Became diaphoresis. Took aspirin himself and he refused nitro. Family hx of early MIs- dad had MI in 9s and died in 108s of MI. Run of bigeminy for 15 seconds.

## 2022-09-11 NOTE — ED Notes (Signed)
See also triage note- Pt also reports sensation in URQ that "feels like something is mving under the skin."

## 2022-09-12 ENCOUNTER — Emergency Department
Admission: EM | Admit: 2022-09-12 | Discharge: 2022-09-12 | Discharge status: Against Medical Advice | Payer: Medicaid Other | Attending: Emergency Medicine | Admitting: Emergency Medicine

## 2022-09-12 LAB — TROPONIN I (HIGH SENSITIVITY): Troponin I (High Sensitivity): 3 ng/L (ref <18)

## 2022-09-14 DIAGNOSIS — R519 Headache, unspecified: Secondary | ICD-10-CM | POA: Diagnosis not present

## 2022-09-14 DIAGNOSIS — R0781 Pleurodynia: Secondary | ICD-10-CM | POA: Diagnosis not present

## 2022-09-14 DIAGNOSIS — Z5321 Procedure and treatment not carried out due to patient leaving prior to being seen by health care provider: Secondary | ICD-10-CM | POA: Diagnosis not present

## 2022-09-14 DIAGNOSIS — I498 Other specified cardiac arrhythmias: Secondary | ICD-10-CM | POA: Diagnosis not present

## 2022-09-14 DIAGNOSIS — R109 Unspecified abdominal pain: Secondary | ICD-10-CM | POA: Diagnosis not present

## 2022-09-15 DIAGNOSIS — R1011 Right upper quadrant pain: Secondary | ICD-10-CM | POA: Diagnosis not present

## 2022-09-15 DIAGNOSIS — R1013 Epigastric pain: Secondary | ICD-10-CM | POA: Diagnosis not present

## 2022-09-15 DIAGNOSIS — R160 Hepatomegaly, not elsewhere classified: Secondary | ICD-10-CM | POA: Diagnosis not present

## 2022-09-15 DIAGNOSIS — F17210 Nicotine dependence, cigarettes, uncomplicated: Secondary | ICD-10-CM | POA: Diagnosis not present

## 2022-09-15 DIAGNOSIS — K760 Fatty (change of) liver, not elsewhere classified: Secondary | ICD-10-CM | POA: Diagnosis not present

## 2022-09-15 DIAGNOSIS — R110 Nausea: Secondary | ICD-10-CM | POA: Diagnosis not present

## 2022-10-31 ENCOUNTER — Emergency Department: Payer: Medicaid Other

## 2022-10-31 ENCOUNTER — Encounter: Payer: Self-pay | Admitting: Emergency Medicine

## 2022-10-31 ENCOUNTER — Other Ambulatory Visit: Payer: Self-pay

## 2022-10-31 DIAGNOSIS — M549 Dorsalgia, unspecified: Secondary | ICD-10-CM | POA: Diagnosis not present

## 2022-10-31 DIAGNOSIS — R0602 Shortness of breath: Secondary | ICD-10-CM | POA: Diagnosis not present

## 2022-10-31 DIAGNOSIS — Z5321 Procedure and treatment not carried out due to patient leaving prior to being seen by health care provider: Secondary | ICD-10-CM | POA: Insufficient documentation

## 2022-10-31 DIAGNOSIS — R079 Chest pain, unspecified: Secondary | ICD-10-CM | POA: Diagnosis not present

## 2022-10-31 LAB — BASIC METABOLIC PANEL
Anion gap: 9 (ref 5–15)
BUN: 16 mg/dL (ref 6–20)
CO2: 27 mmol/L (ref 22–32)
Calcium: 9 mg/dL (ref 8.9–10.3)
Chloride: 101 mmol/L (ref 98–111)
Creatinine, Ser: 1.03 mg/dL (ref 0.61–1.24)
GFR, Estimated: >60 mL/min (ref >60)
Glucose, Bld: 120 mg/dL — ABNORMAL HIGH (ref 70–99)
Potassium: 3.6 mmol/L (ref 3.5–5.1)
Sodium: 137 mmol/L (ref 135–145)

## 2022-10-31 LAB — CBC
HCT: 45.9 % (ref 39.0–52.0)
Hemoglobin: 14.9 g/dL (ref 13.0–17.0)
MCH: 28.1 pg (ref 26.0–34.0)
MCHC: 32.5 g/dL (ref 30.0–36.0)
MCV: 86.4 fL (ref 80.0–100.0)
Platelets: 278 10*3/uL (ref 150–400)
RBC: 5.31 MIL/uL (ref 4.22–5.81)
RDW: 12.2 % (ref 11.5–15.5)
WBC: 9.3 10*3/uL (ref 4.0–10.5)
nRBC: 0 % (ref 0.0–0.2)

## 2022-10-31 LAB — TROPONIN I (HIGH SENSITIVITY): Troponin I (High Sensitivity): 8 ng/L (ref <18)

## 2022-10-31 NOTE — ED Triage Notes (Signed)
Pt via POV c/o back pain between shoulders radiating to chest with associated SOB, started about an hour PTA. Pt states he took 3 aspirin tablets but is not sure of the dosage. PMH heart murmur as an infant, resolved. Pain rated 10/10.

## 2022-11-01 ENCOUNTER — Emergency Department
Admission: EM | Admit: 2022-11-01 | Discharge: 2022-11-01 | Discharge status: Against Medical Advice | Payer: Medicaid Other | Attending: Emergency Medicine | Admitting: Emergency Medicine

## 2022-11-03 DIAGNOSIS — J320 Chronic maxillary sinusitis: Secondary | ICD-10-CM | POA: Diagnosis not present

## 2022-11-03 DIAGNOSIS — R112 Nausea with vomiting, unspecified: Secondary | ICD-10-CM | POA: Diagnosis not present

## 2022-11-03 DIAGNOSIS — R519 Headache, unspecified: Secondary | ICD-10-CM | POA: Diagnosis not present

## 2022-11-03 DIAGNOSIS — R0989 Other specified symptoms and signs involving the circulatory and respiratory systems: Secondary | ICD-10-CM | POA: Diagnosis not present

## 2022-11-03 DIAGNOSIS — Z79899 Other long term (current) drug therapy: Secondary | ICD-10-CM | POA: Diagnosis not present

## 2022-11-03 DIAGNOSIS — F17210 Nicotine dependence, cigarettes, uncomplicated: Secondary | ICD-10-CM | POA: Diagnosis not present

## 2022-11-07 ENCOUNTER — Emergency Department: Payer: Medicaid Other

## 2022-11-07 ENCOUNTER — Emergency Department
Admission: EM | Admit: 2022-11-07 | Discharge: 2022-11-07 | Disposition: A | Discharge status: Home / Self Care | Payer: Medicaid Other | Attending: Student in an Organized Health Care Education/Training Program | Admitting: Student in an Organized Health Care Education/Training Program

## 2022-11-07 ENCOUNTER — Other Ambulatory Visit: Payer: Self-pay

## 2022-11-07 ENCOUNTER — Encounter: Payer: Self-pay | Admitting: Emergency Medicine

## 2022-11-07 DIAGNOSIS — R0789 Other chest pain: Secondary | ICD-10-CM | POA: Diagnosis not present

## 2022-11-07 DIAGNOSIS — R079 Chest pain, unspecified: Secondary | ICD-10-CM | POA: Insufficient documentation

## 2022-11-07 DIAGNOSIS — M25512 Pain in left shoulder: Secondary | ICD-10-CM | POA: Diagnosis not present

## 2022-11-07 DIAGNOSIS — M25511 Pain in right shoulder: Secondary | ICD-10-CM | POA: Insufficient documentation

## 2022-11-07 DIAGNOSIS — M79641 Pain in right hand: Secondary | ICD-10-CM | POA: Insufficient documentation

## 2022-11-07 DIAGNOSIS — R202 Paresthesia of skin: Secondary | ICD-10-CM | POA: Insufficient documentation

## 2022-11-07 LAB — CBC
HCT: 45.5 % (ref 39.0–52.0)
Hemoglobin: 14.5 g/dL (ref 13.0–17.0)
MCH: 28 pg (ref 26.0–34.0)
MCHC: 31.9 g/dL (ref 30.0–36.0)
MCV: 87.8 fL (ref 80.0–100.0)
Platelets: 256 10*3/uL (ref 150–400)
RBC: 5.18 MIL/uL (ref 4.22–5.81)
RDW: 12.1 % (ref 11.5–15.5)
WBC: 8.5 10*3/uL (ref 4.0–10.5)
nRBC: 0 % (ref 0.0–0.2)

## 2022-11-07 LAB — BASIC METABOLIC PANEL
Anion gap: 10 (ref 5–15)
BUN: 16 mg/dL (ref 6–20)
CO2: 24 mmol/L (ref 22–32)
Calcium: 8.7 mg/dL — ABNORMAL LOW (ref 8.9–10.3)
Chloride: 103 mmol/L (ref 98–111)
Creatinine, Ser: 0.95 mg/dL (ref 0.61–1.24)
GFR, Estimated: >60 mL/min (ref >60)
Glucose, Bld: 113 mg/dL — ABNORMAL HIGH (ref 70–99)
Potassium: 4 mmol/L (ref 3.5–5.1)
Sodium: 137 mmol/L (ref 135–145)

## 2022-11-07 LAB — TROPONIN I (HIGH SENSITIVITY): Troponin I (High Sensitivity): 4 ng/L (ref <18)

## 2022-11-07 NOTE — ED Triage Notes (Signed)
Pt went to sleep last night and was normal. Pt woke up this morning 1 hour ago with R arm numbness from armpit to fingertips. Pt also complains of central non radiating chest pain that is sharp and worse with inspiration. Pt has no arm drift. Speech clear.  States R arm also hurting "10/10". Pt took 4 chewable aspirin PTA. Pt in NAD.

## 2022-11-07 NOTE — ED Notes (Signed)
Pt went outside to smoke cigarette. Xray tech went and got pt to bring to xray.

## 2022-11-07 NOTE — ED Provider Notes (Signed)
[facility] Provider Note    Event Date/Time   First MD Initiated Contact with Patient 11/07/22 1200     (approximate)   History   Chest Pain and Numbness   HPI  Patrick Huffman is a 32 y.o. male presents the ER for evaluation some pain in his shoulders as well as some right hand pain and numbness tingling that occurred when he woke up.  States he might of slept on it wrong and fell at the pain was getting better but was still lingering so he came to the hospital.  By the time I examined the patient symptoms have resolved.  He denies any other associated numbness or tingling.  No pleuritic chest pain.  No pain with ripping or tearing through to his back.  Denies any chest discomfort or pressure.  No nausea or vomiting.     Physical Exam   Triage Vital Signs: ED Triage Vitals  Encounter Vitals Group     BP 11/07/22 1045 117/81     Systolic BP Percentile --      Diastolic BP Percentile --      Pulse Rate 11/07/22 1045 88     Resp 11/07/22 1045 20     Temp 11/07/22 1045 98.5 F (36.9 C)     Temp Source 11/07/22 1045 Oral     SpO2 11/07/22 1045 96 %     Weight 11/07/22 1042 200 lb (90.7 kg)     Height 11/07/22 1042 6' (1.829 m)     Head Circumference --      Peak Flow --      Pain Score 11/07/22 1042 8     Pain Loc --      Pain Education --      Exclude from Growth Chart --     Most recent vital signs: Vitals:   11/07/22 1045 11/07/22 1143  BP: 117/81 130/86  Pulse: 88 76  Resp: 20 18  Temp: 98.5 F (36.9 C)   SpO2: 96% 100%     Constitutional: Alert  Eyes: Conjunctivae are normal.  Head: Atraumatic. Nose: No congestion/rhinnorhea. Mouth/Throat: Mucous membranes are moist.   Neck: Painless ROM.  Cardiovascular:   Good peripheral circulation.  No murmurs rubs or gallops.  Dry and equal radial and ulnar pulses bilateral upper extremities. Respiratory: Normal respiratory effort.  No retractions.  Gastrointestinal: Soft and nontender.   Musculoskeletal:  no deformity Neurologic:  MAE spontaneously. No gross focal neurologic deficits are appreciated.  Skin:  Skin is warm, dry and intact. No rash noted. Psychiatric: Mood and affect are normal. Speech and behavior are normal.    ED Results / Procedures / Treatments   Labs (all labs ordered are listed, but only abnormal results are displayed) Labs Reviewed  BASIC METABOLIC PANEL - Abnormal; Notable for the following components:      Result Value   Glucose, Bld 113 (*)    Calcium 8.7 (*)    All other components within normal limits  CBC  TROPONIN I (HIGH SENSITIVITY)  TROPONIN I (HIGH SENSITIVITY)     EKG  ED ECG REPORT I, Willy Eddy, the attending physician, personally viewed and interpreted this ECG.   Date: 11/07/2022  EKG Time: 10:42  Rate: 75  Rhythm: sinus  Axis: normal  Intervals: normal  ST&T Change: no stemi, no depressions    RADIOLOGY Please see ED Course for my review and interpretation.  I personally reviewed all radiographic images ordered to evaluate for the  above acute complaints and reviewed radiology reports and findings.  These findings were personally discussed with the patient.  Please see medical record for radiology report.    PROCEDURES:  Critical Care performed: No  Procedures   MEDICATIONS ORDERED IN ED: Medications - No data to display   IMPRESSION / MDM / ASSESSMENT AND PLAN / ED COURSE  I reviewed the triage vital signs and the nursing notes.                              Differential diagnosis includes, but is not limited to, ACS, pericarditis, esophagitis, boerhaaves, pe, dissection, pna, bronchitis, costochondritis  Patient presenting to the ER for evaluation of symptoms as described above.  Based on symptoms, risk factors and considered above differential, this presenting complaint could reflect a potentially life-threatening illness therefore the patient will be placed on continuous pulse oximetry and  telemetry for monitoring.  Laboratory evaluation will be sent to evaluate for the above complaints.  Patient clinically well-appearing asymptomatic on my evaluation.  EKG is nonischemic.  His exam is reassuring.  He is low risk by Wells criteria he is PERC negative.  Troponin negative.  Not consistent with ACS.  He is low risk by heart score.  Do not feel that further diagnostic testing clinically indicated given reassuring exam.  Feel more likely patient slept wrong on his arm over the night as his symptoms have resolved.  Doubt limb ischemia.  Not consistent with CVA or TIA.       FINAL CLINICAL IMPRESSION(S) / ED DIAGNOSES   Final diagnoses:  Paresthesia  Chest pain, unspecified type     Rx / DC Orders   ED Discharge Orders     None        Note:  This document was prepared using Dragon voice recognition software and may include unintentional dictation errors.    Willy Eddy, MD 11/07/22 [phone]

## 2022-11-07 NOTE — ED Notes (Signed)
Pt verbalizes understanding of discharge instructions. Opportunity for questioning and answers were provided. Pt discharged from ED to home.   ? ?

## 2022-12-03 DIAGNOSIS — J342 Deviated nasal septum: Secondary | ICD-10-CM | POA: Diagnosis not present

## 2022-12-03 DIAGNOSIS — J320 Chronic maxillary sinusitis: Secondary | ICD-10-CM | POA: Diagnosis not present

## 2022-12-04 DIAGNOSIS — H5712 Ocular pain, left eye: Secondary | ICD-10-CM | POA: Diagnosis not present

## 2022-12-04 DIAGNOSIS — J320 Chronic maxillary sinusitis: Secondary | ICD-10-CM | POA: Diagnosis not present

## 2022-12-05 ENCOUNTER — Encounter: Payer: Self-pay | Admitting: *Deleted

## 2022-12-05 ENCOUNTER — Emergency Department
Admission: EM | Admit: 2022-12-05 | Discharge: 2022-12-05 | Disposition: A | Discharge status: Home / Self Care | Payer: Medicaid Other | Attending: Emergency Medicine | Admitting: Emergency Medicine

## 2022-12-05 ENCOUNTER — Other Ambulatory Visit: Payer: Self-pay

## 2022-12-05 ENCOUNTER — Emergency Department: Payer: Medicaid Other

## 2022-12-05 DIAGNOSIS — R0789 Other chest pain: Secondary | ICD-10-CM | POA: Diagnosis not present

## 2022-12-05 DIAGNOSIS — Z7982 Long term (current) use of aspirin: Secondary | ICD-10-CM | POA: Diagnosis not present

## 2022-12-05 DIAGNOSIS — J320 Chronic maxillary sinusitis: Secondary | ICD-10-CM | POA: Diagnosis not present

## 2022-12-05 DIAGNOSIS — H5712 Ocular pain, left eye: Secondary | ICD-10-CM | POA: Diagnosis not present

## 2022-12-05 DIAGNOSIS — R079 Chest pain, unspecified: Secondary | ICD-10-CM | POA: Diagnosis not present

## 2022-12-05 LAB — CBC
HCT: 43 % (ref 39.0–52.0)
Hemoglobin: 14.1 g/dL (ref 13.0–17.0)
MCH: 27.9 pg (ref 26.0–34.0)
MCHC: 32.8 g/dL (ref 30.0–36.0)
MCV: 85 fL (ref 80.0–100.0)
Platelets: 248 10*3/uL (ref 150–400)
RBC: 5.06 MIL/uL (ref 4.22–5.81)
RDW: 12.4 % (ref 11.5–15.5)
WBC: 8.8 10*3/uL (ref 4.0–10.5)
nRBC: 0 % (ref 0.0–0.2)

## 2022-12-05 LAB — TROPONIN I (HIGH SENSITIVITY): Troponin I (High Sensitivity): 4 ng/L (ref <18)

## 2022-12-05 LAB — BASIC METABOLIC PANEL
Anion gap: 8 (ref 5–15)
BUN: 15 mg/dL (ref 6–20)
CO2: 26 mmol/L (ref 22–32)
Calcium: 8.8 mg/dL — ABNORMAL LOW (ref 8.9–10.3)
Chloride: 106 mmol/L (ref 98–111)
Creatinine, Ser: 0.95 mg/dL (ref 0.61–1.24)
GFR, Estimated: >60 mL/min (ref >60)
Glucose, Bld: 110 mg/dL — ABNORMAL HIGH (ref 70–99)
Potassium: 4.1 mmol/L (ref 3.5–5.1)
Sodium: 140 mmol/L (ref 135–145)

## 2022-12-05 MED ORDER — KETOROLAC TROMETHAMINE 30 MG/ML IJ SOLN
15.0000 mg | Freq: Once | INTRAMUSCULAR | Status: AC
  Administered 2022-12-05: 15 mg via INTRAVENOUS
  Filled 2022-12-05: qty 1

## 2022-12-05 NOTE — ED Triage Notes (Signed)
Pt reports he was trying to sleep and started having pain in the left chest about 35 minutes ago. He called 911 and they told him to take aspirin which relieved some of his pain but the pain returned. Pt reports he became very sweaty, had SOB. Pain is "pressure" and "stabbing"

## 2022-12-05 NOTE — ED Provider Notes (Signed)
[facility] Provider Note    Event Date/Time   First MD Initiated Contact with Patient 12/05/22 [phone]     (approximate)   History   Chest Pain   HPI  Patrick Huffman is a 32 y.o. male who presents to the ED for evaluation of Chest Pain   I reviewed [facility] ED visit from 2 days ago.  Seen for chronic sinusitis and discharged with steroids and Flonase.  Patient presents for evaluation of chest discomfort.  He was laying in bed, awake and trying to fall asleep when he developed sudden sharp chest pain like he was being stabbed.  Symptoms briefly resolved at home after he took some aspirin while on the phone with 911, then as someone was driving him in by POV developed recurrence of the same pain.  Reports the pain is present during my evaluation initially.  Sharp pain in the left side of his chest.  No coexisting symptoms   Physical Exam   Triage Vital Signs: ED Triage Vitals  Encounter Vitals Group     BP 12/05/22 0544 107/72     Systolic BP Percentile --      Diastolic BP Percentile --      Pulse Rate 12/05/22 0544 78     Resp 12/05/22 0544 16     Temp 12/05/22 0544 97.8 F (36.6 C)     Temp Source 12/05/22 0544 Oral     SpO2 12/05/22 0544 96 %     Weight 12/05/22 0543 200 lb (90.7 kg)     Height 12/05/22 0543 6' (1.829 m)     Head Circumference --      Peak Flow --      Pain Score 12/05/22 0542 10     Pain Loc --      Pain Education --      Exclude from Growth Chart --     Most recent vital signs: Vitals:   12/05/22 0630 12/05/22 0634  BP: (!) 129/95   Pulse: 88   Resp:  16  Temp:    SpO2:      General: Awake, no distress.  CV:  Good peripheral perfusion.  Resp:  Normal effort.  Abd:  No distention.  MSK:  No deformity noted.  Reproducible chest pain on palpation without overlying skin changes or signs of trauma Neuro:  No focal deficits appreciated. Other:     ED Results / Procedures / Treatments   Labs (all labs ordered are  listed, but only abnormal results are displayed) Labs Reviewed  BASIC METABOLIC PANEL - Abnormal; Notable for the following components:      Result Value   Glucose, Bld 110 (*)    Calcium 8.8 (*)    All other components within normal limits  CBC  TROPONIN I (HIGH SENSITIVITY)    EKG Sinus rhythm with a rate of 66 bpm.  Normal axis.  Fairly short PR interval, but otherwise normal intervals.  No clear signs of acute ischemia  RADIOLOGY CXR interpreted by me without evidence of acute cardiopulmonary pathology.  Official radiology report(s): DG Chest 2 View  Result Date: 12/05/2022 CLINICAL DATA:  Chest pain EXAM: CHEST - 2 VIEW COMPARISON:  11/07/2022 FINDINGS: Mildly low volume chest. Normal heart size and mediastinal contours. No acute infiltrate or edema. No effusion or pneumothorax. No acute osseous findings. Artifact from EKG leads IMPRESSION: No active cardiopulmonary disease. Electronically Signed   By: Tiburcio Pea M.D.   On: 12/05/2022 05:59  PROCEDURES and INTERVENTIONS:  .1-3 Lead EKG Interpretation  Performed by: Delton Prairie, MD Authorized by: Delton Prairie, MD     Interpretation: normal     ECG rate:  74   ECG rate assessment: normal     Rhythm: sinus rhythm     Ectopy: none     Conduction: normal     Medications  ketorolac (TORADOL) 30 MG/ML injection 15 mg (15 mg Intravenous Given 12/05/22 0609)     IMPRESSION / MDM / ASSESSMENT AND PLAN / ED COURSE  I reviewed the triage vital signs and the nursing notes.  Differential diagnosis includes, but is not limited to, ACS, PTX, PNA, muscle strain/spasm, PE, dissection, anxiety, pleural effusion  {Patient presents with symptoms of an acute illness or injury that is potentially life-threatening.  Low risk patient presents with atypical chest discomfort.  EKG is nonischemic and CXR is clear. Troponin is negative, CBC and metabolic panel are normal.  As below, he has resolution of symptoms after Toradol.  I  considered holding the patient for longer for observation and a second troponin, but I feel like this would be fairly low yield and unlikely to change disposition or outcome.  Discharged with return precautions  Clinical Course as of 12/05/22 4403  Sat Dec 05, 2022  0630 Reassessed, patient sitting upright crosslegged on the bed eating [employer] and reports feeling much better. [DS]    Clinical Course User Index [DS] Delton Prairie, MD     FINAL CLINICAL IMPRESSION(S) / ED DIAGNOSES   Final diagnoses:  Other chest pain     Rx / DC Orders   ED Discharge Orders     None        Note:  This document was prepared using Dragon voice recognition software and may include unintentional dictation errors.   Delton Prairie, MD 12/05/22 [phone]

## 2022-12-20 DIAGNOSIS — R0981 Nasal congestion: Secondary | ICD-10-CM | POA: Diagnosis not present

## 2022-12-20 DIAGNOSIS — R519 Headache, unspecified: Secondary | ICD-10-CM | POA: Diagnosis not present

## 2022-12-20 DIAGNOSIS — R059 Cough, unspecified: Secondary | ICD-10-CM | POA: Diagnosis not present

## 2022-12-20 DIAGNOSIS — R110 Nausea: Secondary | ICD-10-CM | POA: Diagnosis not present

## 2022-12-20 DIAGNOSIS — F17210 Nicotine dependence, cigarettes, uncomplicated: Secondary | ICD-10-CM | POA: Diagnosis not present

## 2022-12-20 DIAGNOSIS — R42 Dizziness and giddiness: Secondary | ICD-10-CM | POA: Diagnosis not present

## 2022-12-21 DIAGNOSIS — R059 Cough, unspecified: Secondary | ICD-10-CM | POA: Diagnosis not present

## 2022-12-21 DIAGNOSIS — R519 Headache, unspecified: Secondary | ICD-10-CM | POA: Diagnosis not present

## 2022-12-28 ENCOUNTER — Other Ambulatory Visit: Payer: Self-pay

## 2022-12-28 ENCOUNTER — Emergency Department
Admission: EM | Admit: 2022-12-28 | Discharge: 2022-12-28 | Discharge status: Against Medical Advice | Payer: Medicaid Other | Attending: Emergency Medicine | Admitting: Emergency Medicine

## 2022-12-28 DIAGNOSIS — R0600 Dyspnea, unspecified: Secondary | ICD-10-CM | POA: Insufficient documentation

## 2022-12-28 DIAGNOSIS — R079 Chest pain, unspecified: Secondary | ICD-10-CM | POA: Diagnosis not present

## 2022-12-28 DIAGNOSIS — F17210 Nicotine dependence, cigarettes, uncomplicated: Secondary | ICD-10-CM | POA: Diagnosis not present

## 2022-12-28 DIAGNOSIS — J029 Acute pharyngitis, unspecified: Secondary | ICD-10-CM | POA: Insufficient documentation

## 2022-12-28 DIAGNOSIS — Z5321 Procedure and treatment not carried out due to patient leaving prior to being seen by health care provider: Secondary | ICD-10-CM | POA: Diagnosis not present

## 2022-12-28 DIAGNOSIS — R109 Unspecified abdominal pain: Secondary | ICD-10-CM | POA: Insufficient documentation

## 2022-12-28 DIAGNOSIS — J321 Chronic frontal sinusitis: Secondary | ICD-10-CM | POA: Diagnosis not present

## 2022-12-28 NOTE — ED Triage Notes (Signed)
Pt presents to the ED POV from home. Pt states that a friend threw him a piece of candy that had fallen on the ground. States that the candy was wet and possibly fell in a puddle of antifreeze.   Pt reports sore throat, abdominal pain, chest pain, and difficulty breathing.  Called poison control. Poison control recommendation to patient when patient called them was to not come in. Since patient is here and experiencing symptoms poison control recommends basic labs to ensure that patient is not acidotic and does not have an anion gap.

## 2022-12-28 NOTE — ED Notes (Addendum)
Patient states to this RN that poison controll recommended for him not to come in, but that the lady said the exposure wouldn't cause CP and that he came in because he looked it up and it can cause chest pain and that he was not going to listen to someone that doesn't know what they're talking about. This RN informed patient that we would get a EKG and basic lab work to ensure that everything looked normal and that he would be evaluated by a physician. Pt states that he was going to leave since poison control said he didn't have to be here. This RN informed patient that since he is having the symptoms that it was a good idea to get checked out. Pt refused stating that he didn't like hospitals and that this RN could take him off the board. This RN asked if he would let us get an EKG r/t the chest pain. Patient refused and left at this time. Triage incomplete due to patient leaving in the middle of it.

## 2023-01-10 ENCOUNTER — Other Ambulatory Visit: Payer: Self-pay

## 2023-01-10 ENCOUNTER — Emergency Department
Admission: EM | Admit: 2023-01-10 | Discharge: 2023-01-10 | Disposition: A | Discharge status: Home / Self Care | Payer: Medicaid Other | Attending: Emergency Medicine | Admitting: Emergency Medicine

## 2023-01-10 DIAGNOSIS — R0789 Other chest pain: Secondary | ICD-10-CM | POA: Diagnosis not present

## 2023-01-10 DIAGNOSIS — K0889 Other specified disorders of teeth and supporting structures: Secondary | ICD-10-CM | POA: Diagnosis not present

## 2023-01-10 DIAGNOSIS — J320 Chronic maxillary sinusitis: Secondary | ICD-10-CM | POA: Insufficient documentation

## 2023-01-10 DIAGNOSIS — G4489 Other headache syndrome: Secondary | ICD-10-CM | POA: Diagnosis not present

## 2023-01-10 DIAGNOSIS — R519 Headache, unspecified: Secondary | ICD-10-CM | POA: Diagnosis present

## 2023-01-10 DIAGNOSIS — R609 Edema, unspecified: Secondary | ICD-10-CM | POA: Diagnosis not present

## 2023-01-10 DIAGNOSIS — J0100 Acute maxillary sinusitis, unspecified: Secondary | ICD-10-CM | POA: Diagnosis not present

## 2023-01-10 MED ORDER — KETOROLAC TROMETHAMINE 15 MG/ML IJ SOLN
15.0000 mg | Freq: Once | INTRAMUSCULAR | Status: AC
  Administered 2023-01-10: 15 mg via INTRAVENOUS
  Filled 2023-01-10: qty 1

## 2023-01-10 MED ORDER — LORATADINE 10 MG PO TABS: 10.0000 mg | ORAL_TABLET | Freq: Every day | ORAL | 2 refills | Status: AC

## 2023-01-10 MED ORDER — PREDNISONE 10 MG (21) PO TBPK: ORAL_TABLET | ORAL | 0 refills | Status: AC

## 2023-01-10 MED ORDER — NAPROXEN 500 MG PO TABS: 500.0000 mg | ORAL_TABLET | Freq: Two times a day (BID) | ORAL | 0 refills | Status: AC

## 2023-01-10 MED ORDER — FAMOTIDINE 20 MG PO TABS: 20.0000 mg | ORAL_TABLET | Freq: Two times a day (BID) | ORAL | 0 refills | Status: AC

## 2023-01-10 MED ORDER — GUAIFENESIN ER 600 MG PO TB12: 600.0000 mg | ORAL_TABLET | Freq: Two times a day (BID) | ORAL | 0 refills | Status: AC

## 2023-01-10 MED ORDER — METHYLPREDNISOLONE SODIUM SUCC 125 MG IJ SOLR
125.0000 mg | INTRAMUSCULAR | Status: AC
  Administered 2023-01-10: 125 mg via INTRAVENOUS
  Filled 2023-01-10: qty 2

## 2023-01-10 NOTE — ED Triage Notes (Signed)
Pt arrives via EMS. Per EMS pt has been having head pressure as well as other s/s for the last several weeks. Per EMS today pt started to have blurred vision with head pressure. Pt has been to multiple different hospitals for the same thing. Pt sts that one MD told him that he could have a hole in his head.

## 2023-01-10 NOTE — ED Provider Notes (Signed)
[facility] Provider Note    None    (approximate)   History   Headache   HPI  Patrick Huffman is a 32 y.o. male with a multiple month history of left facial pain who comes ED complaining of persistent left facial pain, which is making it difficult for him to sleep.  No vision changes or severe headaches, no neck stiffness or pain, no fever or chills.  No trouble breathing or swallowing.  No trauma.  He was seen by [facility] ENT, prescribed nasal irrigation and 4-week follow-up.  Also of note, patient recently had CT facial bones and CT head which are significant for severe narrowing of the left maxillary ostium and findings of chronic sinusitis   12/30/22 [facility] ENT eval: Mr. Kautzman presents for evaluation of chronic sinusitis. He describes severe facial pain that is out of proportion to his symptoms and physical examination. The findings may represent a differential diagnosis of Vasculitis Vs. Intra nasal drug use.  He will complete an autoimmune panel and a drug test and will start using Xylitol nasal irrigations at list BID.   The patient's physical examination findings including sinonasal endoscopy were thoroughly discussed. The patient voiced complete understanding of plan as detailed above and is in full agreement.  We will follow up in 4 weeks, or sooner if needed.       Physical Exam   Triage Vital Signs: ED Triage Vitals  Encounter Vitals Group     BP 01/10/23 1511 (!) 159/89     Systolic BP Percentile --      Diastolic BP Percentile --      Pulse Rate 01/10/23 1511 93     Resp 01/10/23 1511 19     Temp 01/10/23 1511 98.7 F (37.1 C)     Temp Source 01/10/23 1511 Oral     SpO2 01/10/23 1511 99 %     Weight 01/10/23 1512 200 lb (90.7 kg)     Height 01/10/23 1512 6' (1.829 m)     Head Circumference --      Peak Flow --      Pain Score 01/10/23 1512 10     Pain Loc --      Pain Education --      Exclude from Growth Chart --     Most recent  vital signs: Vitals:   01/10/23 1511  BP: (!) 159/89  Pulse: 93  Resp: 19  Temp: 98.7 F (37.1 C)  SpO2: 99%    General: Awake, no distress.  CV:  Good peripheral perfusion.  Regular rate and rhythm Resp:  Normal effort.  Clear to auscultation bilaterally Abd:  No distention.  Other:  Moist oral mucosa.  No oropharyngeal swelling.  Uvula midline.  Nasal exam reveals boggy, friable tissues without process or identifiable fluid collection.  Cranial nerves III through XII intact   ED Results / Procedures / Treatments   Labs (all labs ordered are listed, but only abnormal results are displayed) Labs Reviewed - No data to display   RADIOLOGY    PROCEDURES:  Procedures   MEDICATIONS ORDERED IN ED: Medications  ketorolac (TORADOL) 15 MG/ML injection 15 mg (has no administration in time range)  methylPREDNISolone sodium succinate (SOLU-MEDROL) 125 mg/2 mL injection 125 mg (has no administration in time range)     IMPRESSION / MDM / ASSESSMENT AND PLAN / ED COURSE  I reviewed the triage vital signs and the nursing notes.  Patient presents with chronic sinusitis.  He has tried multiple different antibiotics without improvement.  Does not show signs of infection today.  His CT finding of a narrowed maxillary sinus ostium suggest that his symptoms are partially due to obstruction of the sinus in conjunction with the inflammation. Will maximize anti-inflammatory tx along with mucinex and claritan. Emphasized continuing the sinus irrigation and ent f/u. He sleeps on L side, which impairs drainage - counseled him to strictly sleep on R side to see if this helps.  Pepcid for GI prophylaxis.       FINAL CLINICAL IMPRESSION(S) / ED DIAGNOSES   Final diagnoses:  Chronic maxillary sinusitis     Rx / DC Orders   ED Discharge Orders          Ordered    predniSONE (STERAPRED UNI-PAK 21 TAB) 10 MG (21) TBPK tablet        01/10/23 1603    naproxen  (NAPROSYN) 500 MG tablet  2 times daily with meals        01/10/23 1603    guaiFENesin (MUCINEX) 600 MG 12 hr tablet  2 times daily        01/10/23 1603    loratadine (CLARITIN) 10 MG tablet  Daily        01/10/23 1603    famotidine (PEPCID) 20 MG tablet  2 times daily        01/10/23 1603             Note:  This document was prepared using Dragon voice recognition software and may include unintentional dictation errors.   Sharman Cheek, MD 01/10/23 [phone]

## 2023-01-10 NOTE — Discharge Instructions (Addendum)
Continue using nasal rinse as instructed by [facility]ose [facility] specialist.  Sleep on your right side to promote drainage of the sinus. Follow up with ENT as scheduled.

## 2023-02-09 ENCOUNTER — Emergency Department
Admission: EM | Admit: 2023-02-09 | Discharge: 2023-02-09 | Disposition: A | Discharge status: Home / Self Care | Payer: Medicaid Other | Attending: Student in an Organized Health Care Education/Training Program | Admitting: Student in an Organized Health Care Education/Training Program

## 2023-02-09 ENCOUNTER — Other Ambulatory Visit: Payer: Self-pay

## 2023-02-09 ENCOUNTER — Emergency Department: Payer: Medicaid Other

## 2023-02-09 DIAGNOSIS — M7989 Other specified soft tissue disorders: Secondary | ICD-10-CM | POA: Insufficient documentation

## 2023-02-09 DIAGNOSIS — M79671 Pain in right foot: Secondary | ICD-10-CM | POA: Diagnosis not present

## 2023-02-09 DIAGNOSIS — R590 Localized enlarged lymph nodes: Secondary | ICD-10-CM | POA: Diagnosis not present

## 2023-02-09 NOTE — ED Triage Notes (Signed)
Pt to ED for "vein swelling" or right foot when woke up this am. Denies injuries.

## 2023-02-09 NOTE — ED Notes (Signed)
This RN entered the pt room and pt could not be located. Waited several minutes and still could not locate pt. Attempted to call mobile and home phone, did not receive an answer and could not leave a voicemail.

## 2023-02-09 NOTE — ED Provider Notes (Signed)
[facility] Provider Note    Event Date/Time   First MD Initiated Contact with Patient 02/09/23 [phone]     (approximate)   History   Foot Pain   HPI  Patrick Huffman is a 32 y.o. male no history of DVT PE presents to the ER for evaluation of tenderness and swelling of the medial ankle.  Denies any trauma.  States that he was sitting crosslegged quite some time last night did have some discomfort and noted that he had a tender vein in that area.  His significant other looked at it and then started reading about symptoms on the Internet and was worried for blood clot and patient encouraged to come to the ER for further evaluation.     Physical Exam   Triage Vital Signs: ED Triage Vitals  Encounter Vitals Group     BP 02/09/23 0736 124/88     Systolic BP Percentile --      Diastolic BP Percentile --      Pulse Rate 02/09/23 0736 79     Resp 02/09/23 0736 16     Temp 02/09/23 0736 98.5 F (36.9 C)     Temp Source 02/09/23 0736 Oral     SpO2 02/09/23 0736 96 %     Weight 02/09/23 0733 200 lb (90.7 kg)     Height 02/09/23 0733 6' (1.829 m)     Head Circumference --      Peak Flow --      Pain Score 02/09/23 0733 5     Pain Loc --      Pain Education --      Exclude from Growth Chart --     Most recent vital signs: Vitals:   02/09/23 0736  BP: 124/88  Pulse: 79  Resp: 16  Temp: 98.5 F (36.9 C)  SpO2: 96%     Constitutional: Alert  Eyes: Conjunctivae are normal.  Head: Atraumatic. Nose: No congestion/rhinnorhea. Mouth/Throat: Mucous membranes are moist.   Neck: Painless ROM.  Cardiovascular:   Good peripheral circulation. Respiratory: Normal respiratory effort.  No retractions.  Gastrointestinal: Soft and nontender.  Musculoskeletal:  no deformity Neurologic:  MAE spontaneously. No gross focal neurologic deficits are appreciated.  Skin:  Skin is warm, dry and intact. No rash noted.     ED Results / Procedures / Treatments    Labs (all labs ordered are listed, but only abnormal results are displayed) Labs Reviewed - No data to display   EKG     RADIOLOGY Please see ED Course for my review and interpretation.  I personally reviewed all radiographic images ordered to evaluate for the above acute complaints and reviewed radiology reports and findings.  These findings were personally discussed with the patient.  Please see medical record for radiology report.    PROCEDURES:  Critical Care performed: No  Procedures   MEDICATIONS ORDERED IN ED: Medications - No data to display   IMPRESSION / MDM / ASSESSMENT AND PLAN / ED COURSE  I reviewed the triage vital signs and the nursing notes.                              Differential diagnosis includes, but is not limited to, DVT, varicose vein, superficial thrombophlebitis  Patient presented to the ER for evaluation symptoms as described above.  Clinically seems most consistent with varicose vein given discomfort will order ultrasound to further evaluate.  Clinical Course as of 02/09/23 1014  Tue Feb 09, 2023  1013 Ultrasound without evidence of DVT.  Patient stable and appropriate for outpatient follow-up. [PR]    Clinical Course User Index [PR] Willy Eddy, MD     FINAL CLINICAL IMPRESSION(S) / ED DIAGNOSES   Final diagnoses:  Foot pain, right     Rx / DC Orders   ED Discharge Orders     None        Note:  This document was prepared using Dragon voice recognition software and may include unintentional dictation errors.    Willy Eddy, MD 02/09/23 1014

## 2023-02-12 ENCOUNTER — Encounter: Payer: Self-pay | Admitting: Emergency Medicine

## 2023-02-12 ENCOUNTER — Emergency Department: Payer: Medicaid Other

## 2023-02-12 ENCOUNTER — Emergency Department
Admission: EM | Admit: 2023-02-12 | Discharge: 2023-02-12 | Disposition: A | Discharge status: Home / Self Care | Payer: Medicaid Other | Attending: Emergency Medicine | Admitting: Emergency Medicine

## 2023-02-12 ENCOUNTER — Other Ambulatory Visit: Payer: Self-pay

## 2023-02-12 DIAGNOSIS — R9431 Abnormal electrocardiogram [ECG] [EKG]: Secondary | ICD-10-CM | POA: Diagnosis not present

## 2023-02-12 DIAGNOSIS — K2900 Acute gastritis without bleeding: Secondary | ICD-10-CM | POA: Insufficient documentation

## 2023-02-12 DIAGNOSIS — K838 Other specified diseases of biliary tract: Secondary | ICD-10-CM | POA: Diagnosis not present

## 2023-02-12 DIAGNOSIS — K760 Fatty (change of) liver, not elsewhere classified: Secondary | ICD-10-CM | POA: Diagnosis not present

## 2023-02-12 DIAGNOSIS — R1013 Epigastric pain: Secondary | ICD-10-CM

## 2023-02-12 DIAGNOSIS — K828 Other specified diseases of gallbladder: Secondary | ICD-10-CM | POA: Diagnosis not present

## 2023-02-12 LAB — URINALYSIS, ROUTINE W REFLEX MICROSCOPIC
Bilirubin Urine: NEGATIVE
Glucose, UA: NEGATIVE mg/dL
Hgb urine dipstick: NEGATIVE
Ketones, ur: NEGATIVE mg/dL
Leukocytes,Ua: NEGATIVE
Nitrite: NEGATIVE
Protein, ur: NEGATIVE mg/dL
Specific Gravity, Urine: 1.021 (ref 1.005–1.030)
pH: 5 (ref 5.0–8.0)

## 2023-02-12 LAB — COMPREHENSIVE METABOLIC PANEL
ALT: 104 U/L — ABNORMAL HIGH (ref 0–44)
AST: 52 U/L — ABNORMAL HIGH (ref 15–41)
Albumin: 4.2 g/dL (ref 3.5–5.0)
Alkaline Phosphatase: 73 U/L (ref 38–126)
Anion gap: 11 (ref 5–15)
BUN: 13 mg/dL (ref 6–20)
CO2: 24 mmol/L (ref 22–32)
Calcium: 8.8 mg/dL — ABNORMAL LOW (ref 8.9–10.3)
Chloride: 101 mmol/L (ref 98–111)
Creatinine, Ser: 0.99 mg/dL (ref 0.61–1.24)
GFR, Estimated: >60 mL/min (ref >60)
Glucose, Bld: 113 mg/dL — ABNORMAL HIGH (ref 70–99)
Potassium: 4.1 mmol/L (ref 3.5–5.1)
Sodium: 136 mmol/L (ref 135–145)
Total Bilirubin: 0.5 mg/dL (ref 0.3–1.2)
Total Protein: 7.6 g/dL (ref 6.5–8.1)

## 2023-02-12 LAB — CBC
HCT: 47.1 % (ref 39.0–52.0)
Hemoglobin: 15.4 g/dL (ref 13.0–17.0)
MCH: 28.3 pg (ref 26.0–34.0)
MCHC: 32.7 g/dL (ref 30.0–36.0)
MCV: 86.4 fL (ref 80.0–100.0)
Platelets: 227 10*3/uL (ref 150–400)
RBC: 5.45 MIL/uL (ref 4.22–5.81)
RDW: 12.8 % (ref 11.5–15.5)
WBC: 7.2 10*3/uL (ref 4.0–10.5)
nRBC: 0 % (ref 0.0–0.2)

## 2023-02-12 LAB — LIPASE, BLOOD: Lipase: 31 U/L (ref 11–51)

## 2023-02-12 MED ORDER — PANTOPRAZOLE SODIUM 40 MG PO TBEC: 40.0000 mg | DELAYED_RELEASE_TABLET | Freq: Every day | ORAL | 1 refills | Status: AC

## 2023-02-12 MED ORDER — LIDOCAINE VISCOUS HCL 2 % MT SOLN
15.0000 mL | Freq: Once | OROMUCOSAL | Status: DC
  Filled 2023-02-12: qty 15

## 2023-02-12 MED ORDER — ALUM & MAG HYDROXIDE-SIMETH 200-200-20 MG/5ML PO SUSP
30.0000 mL | Freq: Once | ORAL | Status: AC
  Administered 2023-02-12: 30 mL via ORAL
  Filled 2023-02-12: qty 30

## 2023-02-12 NOTE — ED Provider Notes (Signed)
[facility] Provider Note    Event Date/Time   First MD Initiated Contact with Patient 02/12/23 1316     (approximate)   History   Chief Complaint Abdominal Pain   HPI  Patrick Huffman is a 32 y.o. male with no significant past medical history who presents to the ED complaining of abdominal pain.  Patient reports that he has had 4 days of increasing pain across both sides of his upper abdomen.  He states this has been associated with nausea but he has not vomited and his oral intake has been normal.  He does feel like his stomach is bloated at times, especially when eating.  He denies any changes in his bowel movements and has not had any difficulty urinating.  He denies any fevers, dysuria, hematuria, or flank pain.  He reports being told in the past that he may have an ulcer, but states he has not taken any medication for this and has not seen GI.      Physical Exam   Triage Vital Signs: ED Triage Vitals  Encounter Vitals Group     BP 02/12/23 1202 135/85     Systolic BP Percentile --      Diastolic BP Percentile --      Pulse Rate 02/12/23 1202 77     Resp 02/12/23 1202 18     Temp 02/12/23 1202 98.5 F (36.9 C)     Temp src --      SpO2 02/12/23 1202 100 %     Weight 02/12/23 1201 200 lb (90.7 kg)     Height 02/12/23 1201 6' (1.829 m)     Head Circumference --      Peak Flow --      Pain Score 02/12/23 1201 10     Pain Loc --      Pain Education --      Exclude from Growth Chart --     Most recent vital signs: Vitals:   02/12/23 1202 02/12/23 1618  BP: 135/85 (!) 123/97  Pulse: 77 79  Resp: 18 17  Temp: 98.5 F (36.9 C) 98 F (36.7 C)  SpO2: 100% 98%    Constitutional: Alert and oriented. Eyes: Conjunctivae are normal. Head: Atraumatic. Nose: No congestion/rhinnorhea. Mouth/Throat: Mucous membranes are moist.  Cardiovascular: Normal rate, regular rhythm. Grossly normal heart sounds.  2+ radial pulses  bilaterally. Respiratory: Normal respiratory effort.  No retractions. Lungs CTAB. Gastrointestinal: Soft and tender to palpation in the epigastrium with no rebound or guarding. No distention. Musculoskeletal: No lower extremity tenderness nor edema.  Neurologic:  Normal speech and language. No gross focal neurologic deficits are appreciated.    ED Results / Procedures / Treatments   Labs (all labs ordered are listed, but only abnormal results are displayed) Labs Reviewed  COMPREHENSIVE METABOLIC PANEL - Abnormal; Notable for the following components:      Result Value   Glucose, Bld 113 (*)    Calcium 8.8 (*)    AST 52 (*)    ALT 104 (*)    All other components within normal limits  URINALYSIS, ROUTINE W REFLEX MICROSCOPIC - Abnormal; Notable for the following components:   Color, Urine YELLOW (*)    APPearance CLEAR (*)    All other components within normal limits  LIPASE, BLOOD  CBC   ED ECG REPORT I, Chesley Noon, the attending physician, personally viewed and interpreted this ECG.   Date: 02/12/2023  EKG Time: 15:18  Rate:  89  Rhythm: normal sinus rhythm  Axis: Normal  Intervals:none  ST&T Change: None   RADIOLOGY Read for quadrant ultrasound reviewed and interpreted by me with no wall thickening, gallstones, or pericholecystic fluid.  PROCEDURES:  Critical Care performed: No  Procedures   MEDICATIONS ORDERED IN ED: Medications  alum & mag hydroxide-simeth (MAALOX/MYLANTA) 200-200-20 MG/5ML suspension 30 mL (30 mLs Oral Given 02/12/23 1410)    And  lidocaine (XYLOCAINE) 2 % viscous mouth solution 15 mL (15 mLs Oral Not Given 02/12/23 1410)     IMPRESSION / MDM / ASSESSMENT AND PLAN / ED COURSE  I reviewed the triage vital signs and the nursing notes.                              32 y.o. male with no significant past medical history presents to the ED with 3 days of increasing pain in his epigastrium associated with nausea and feeling of  bloating.  Patient's presentation is most consistent with acute presentation with potential threat to life or bodily function.  Differential diagnosis includes, but is not limited to, gastritis, PUD, pancreatitis, hepatitis, cholecystitis, biliary colic.  Patient nontoxic-appearing and in no acute distress, vital signs are unremarkable.  His abdomen is soft but he does have tenderness to palpation in his epigastrium.  Labs with no significant anemia, leukocytosis, tract abnormality, or AKI.  He does have mild transaminitis but no elevation in lipase or bilirubin.  Will further assess with right upper quadrant ultrasound, if this is unremarkable then I suspect gastritis versus PUD.  We will treat with GI cocktail and reassess.  Right upper quadrant ultrasound is unremarkable, patient appropriate for discharge home with outpatient GI follow-up for suspected gastritis/GERD.  We will start him on a PPI, he was counseled to return to the ED for new or worsening symptoms.  Patient agrees with plan.      FINAL CLINICAL IMPRESSION(S) / ED DIAGNOSES   Final diagnoses:  Epigastric pain  Acute gastritis without hemorrhage, unspecified gastritis type     Rx / DC Orders   ED Discharge Orders          Ordered    pantoprazole (PROTONIX) 40 MG tablet  Daily        02/12/23 1711             Note:  This document was prepared using Dragon voice recognition software and may include unintentional dictation errors.   Chesley Noon, MD 02/12/23 [phone]

## 2023-02-12 NOTE — ED Notes (Signed)
Pt refused VS at d/c

## 2023-02-12 NOTE — ED Notes (Signed)
See triage note  Presents with generalized abd pain  States pain started about 1 month ago  with some abd swelling  No fever  States he has noticed wt gain

## 2023-02-12 NOTE — ED Triage Notes (Signed)
Multiple complaints.   Pt has for past month abd pain and swelling with a rash  x on abd. Pt states he gain 40 lbs in 1 month. Vomiting on and off. Pt states pain in abd has been getting worse.   Complains of dark malodorous urine x 3 weeks.   Right flank pain x 1 week.   Also complaints of left said head pain x 1 week

## 2023-02-16 ENCOUNTER — Other Ambulatory Visit: Payer: Self-pay

## 2023-02-16 ENCOUNTER — Encounter: Payer: Self-pay | Admitting: Emergency Medicine

## 2023-02-16 ENCOUNTER — Emergency Department
Admission: EM | Admit: 2023-02-16 | Discharge: 2023-02-16 | Disposition: A | Discharge status: Home / Self Care | Payer: Medicaid Other | Attending: Emergency Medicine | Admitting: Emergency Medicine

## 2023-02-16 DIAGNOSIS — J0100 Acute maxillary sinusitis, unspecified: Secondary | ICD-10-CM | POA: Diagnosis not present

## 2023-02-16 DIAGNOSIS — J320 Chronic maxillary sinusitis: Secondary | ICD-10-CM | POA: Insufficient documentation

## 2023-02-16 DIAGNOSIS — R519 Headache, unspecified: Secondary | ICD-10-CM | POA: Diagnosis present

## 2023-02-16 LAB — CBC WITH DIFFERENTIAL/PLATELET
Abs Immature Granulocytes: 0.02 10*3/uL (ref 0.00–0.07)
Basophils Absolute: 0 10*3/uL (ref 0.0–0.1)
Basophils Relative: 1 %
Eosinophils Absolute: 0.2 10*3/uL (ref 0.0–0.5)
Eosinophils Relative: 3 %
HCT: 44 % (ref 39.0–52.0)
Hemoglobin: 14.6 g/dL (ref 13.0–17.0)
Immature Granulocytes: 0 %
Lymphocytes Relative: 42 %
Lymphs Abs: 3.5 10*3/uL (ref 0.7–4.0)
MCH: 27.5 pg (ref 26.0–34.0)
MCHC: 33.2 g/dL (ref 30.0–36.0)
MCV: 83 fL (ref 80.0–100.0)
Monocytes Absolute: 0.5 10*3/uL (ref 0.1–1.0)
Monocytes Relative: 7 %
Neutro Abs: 4 10*3/uL (ref 1.7–7.7)
Neutrophils Relative %: 47 %
Platelets: 255 10*3/uL (ref 150–400)
RBC: 5.3 MIL/uL (ref 4.22–5.81)
RDW: 12.9 % (ref 11.5–15.5)
WBC: 8.3 10*3/uL (ref 4.0–10.5)
nRBC: 0 % (ref 0.0–0.2)

## 2023-02-16 LAB — BASIC METABOLIC PANEL
Anion gap: 10 (ref 5–15)
BUN: 16 mg/dL (ref 6–20)
CO2: 24 mmol/L (ref 22–32)
Calcium: 8.8 mg/dL — ABNORMAL LOW (ref 8.9–10.3)
Chloride: 101 mmol/L (ref 98–111)
Creatinine, Ser: 1.42 mg/dL — ABNORMAL HIGH (ref 0.61–1.24)
GFR, Estimated: >60 mL/min (ref >60)
Glucose, Bld: 118 mg/dL — ABNORMAL HIGH (ref 70–99)
Potassium: 3.9 mmol/L (ref 3.5–5.1)
Sodium: 135 mmol/L (ref 135–145)

## 2023-02-16 MED ORDER — TRAMADOL HCL 50 MG PO TABS: 50.0000 mg | ORAL_TABLET | Freq: Four times a day (QID) | ORAL | 0 refills | Status: AC | PRN

## 2023-02-16 NOTE — ED Triage Notes (Signed)
Patient to ED via POV for "headache problems for months." Also states pain behind eyes x1 week and blurred vision for months as well. States he woke up this AM to a loud noise and had pain/swelling to his forehead. No swelling noted at this time.

## 2023-02-16 NOTE — ED Provider Notes (Signed)
[facility] Provider Note    Event Date/Time   First MD Initiated Contact with Patient 02/16/23 [phone]     (approximate)   History   Headache   HPI  Patrick Huffman is a 32 y.o. male with chronic left maxillary sinusitis presents with complaints of discomfort and pain to his left face/forehead.  He reports this is his typical pain.  He reports he has seen ENT specialist at [facility] but without resolution.  He reports that he has been on multiple medications and antibiotics without improvement.  No neurodeficits.  No new symptoms today.  No fever, no neck pain.  No nausea or vomiting, no change in vision.     Physical Exam   Triage Vital Signs: ED Triage Vitals  Encounter Vitals Group     BP 02/16/23 0730 (!) 127/95     Systolic BP Percentile --      Diastolic BP Percentile --      Pulse Rate 02/16/23 0730 98     Resp 02/16/23 0730 18     Temp 02/16/23 0730 98 F (36.7 C)     Temp Source 02/16/23 0730 Oral     SpO2 02/16/23 0730 95 %     Weight 02/16/23 0731 90.7 kg (200 lb)     Height 02/16/23 0731 1.829 m (6')     Head Circumference --      Peak Flow --      Pain Score 02/16/23 0730 8     Pain Loc --      Pain Education --      Exclude from Growth Chart --     Most recent vital signs: Vitals:   02/16/23 0730  BP: (!) 127/95  Pulse: 98  Resp: 18  Temp: 98 F (36.7 C)  SpO2: 95%     General: Awake, no distress.  No distress CV:  Good peripheral perfusion.  Resp:  Normal effort.  Abd:  No distention.  Other:  Normal neuroexam, no neurodeficits, no nuchal rigidity.  No fevers.  Ambulating well   ED Results / Procedures / Treatments   Labs (all labs ordered are listed, but only abnormal results are displayed) Labs Reviewed  BASIC METABOLIC PANEL - Abnormal; Notable for the following components:      Result Value   Glucose, Bld 118 (*)    Creatinine, Ser 1.42 (*)    Calcium 8.8 (*)    All other components within normal limits   CBC WITH DIFFERENTIAL/PLATELET     EKG     RADIOLOGY     PROCEDURES:  Critical Care performed:   Procedures   MEDICATIONS ORDERED IN ED: Medications - No data to display   IMPRESSION / MDM / ASSESSMENT AND PLAN / ED COURSE  I reviewed the triage vital signs and the nursing notes. Patient's presentation is most consistent with exacerbation of chronic illness.  Reviewed patient's medical record, multiple visits for headache, sinus pain over the years.  Overall well-appearing today and in no acute distress, this appears to be consistent with his typical pain.  No neurodeficits or red flag symptoms.  He is comfortable and has declined pain medication  Will refer to local ENT, no indication for admission at this time.        FINAL CLINICAL IMPRESSION(S) / ED DIAGNOSES   Final diagnoses:  Chronic maxillary sinusitis     Rx / DC Orders   ED Discharge Orders     None  Note:  This document was prepared using Dragon voice recognition software and may include unintentional dictation errors.   Jene Every, MD 02/16/23 [phone]

## 2023-03-06 DIAGNOSIS — M542 Cervicalgia: Secondary | ICD-10-CM | POA: Diagnosis not present

## 2023-03-06 DIAGNOSIS — F17210 Nicotine dependence, cigarettes, uncomplicated: Secondary | ICD-10-CM | POA: Diagnosis not present

## 2023-03-06 DIAGNOSIS — R509 Fever, unspecified: Secondary | ICD-10-CM | POA: Diagnosis not present

## 2023-03-06 DIAGNOSIS — G8929 Other chronic pain: Secondary | ICD-10-CM | POA: Diagnosis not present

## 2023-03-06 DIAGNOSIS — Z1152 Encounter for screening for COVID-19: Secondary | ICD-10-CM | POA: Diagnosis not present

## 2023-03-06 DIAGNOSIS — G43E11 Chronic migraine with aura, intractable, with status migrainosus: Secondary | ICD-10-CM | POA: Diagnosis not present

## 2023-03-06 DIAGNOSIS — H5712 Ocular pain, left eye: Secondary | ICD-10-CM | POA: Diagnosis not present

## 2023-03-06 DIAGNOSIS — G43111 Migraine with aura, intractable, with status migrainosus: Secondary | ICD-10-CM | POA: Diagnosis not present

## 2023-03-06 DIAGNOSIS — R519 Headache, unspecified: Secondary | ICD-10-CM | POA: Diagnosis not present

## 2023-03-10 DIAGNOSIS — M79602 Pain in left arm: Secondary | ICD-10-CM | POA: Diagnosis not present

## 2023-03-10 DIAGNOSIS — Z79899 Other long term (current) drug therapy: Secondary | ICD-10-CM | POA: Diagnosis not present

## 2023-03-10 DIAGNOSIS — R531 Weakness: Secondary | ICD-10-CM | POA: Diagnosis not present

## 2023-03-10 DIAGNOSIS — R0789 Other chest pain: Secondary | ICD-10-CM | POA: Diagnosis not present

## 2023-03-10 DIAGNOSIS — F17210 Nicotine dependence, cigarettes, uncomplicated: Secondary | ICD-10-CM | POA: Diagnosis not present

## 2023-03-11 DIAGNOSIS — M79602 Pain in left arm: Secondary | ICD-10-CM | POA: Diagnosis not present

## 2023-03-14 ENCOUNTER — Other Ambulatory Visit: Payer: Self-pay

## 2023-03-14 ENCOUNTER — Emergency Department
Admission: EM | Admit: 2023-03-14 | Discharge: 2023-03-14 | Disposition: A | Discharge status: Home / Self Care | Payer: Medicaid Other | Attending: Emergency Medicine | Admitting: Emergency Medicine

## 2023-03-14 ENCOUNTER — Emergency Department: Payer: Medicaid Other

## 2023-03-14 DIAGNOSIS — R0602 Shortness of breath: Secondary | ICD-10-CM | POA: Insufficient documentation

## 2023-03-14 DIAGNOSIS — R0789 Other chest pain: Secondary | ICD-10-CM | POA: Diagnosis not present

## 2023-03-14 DIAGNOSIS — R079 Chest pain, unspecified: Secondary | ICD-10-CM | POA: Diagnosis not present

## 2023-03-14 MED ORDER — KETOROLAC TROMETHAMINE 30 MG/ML IJ SOLN: 30.0000 mg | Freq: Once | INTRAMUSCULAR | Status: DC

## 2023-03-14 NOTE — ED Triage Notes (Signed)
Pt reports left side chest pain that woke him from his sleep, pain radiates into left arm. Pt also c/o sob.

## 2023-03-14 NOTE — Discharge Instructions (Signed)
Please take Tylenol and ibuprofen/Advil for your pain.  It is safe to take them together, or to alternate them every few hours.  Take up to 1000mg of Tylenol at a time, up to 4 times per day.  Do not take more than 4000 mg of Tylenol in 24 hours.  For ibuprofen, take 400-600 mg, 3 - 4 times per day.  

## 2023-03-14 NOTE — ED Provider Notes (Signed)
[facility] Provider Note    Event Date/Time   First MD Initiated Contact with Patient 03/14/23 [phone]     (approximate)   History   Chest Pain (w)   HPI  Patrick Huffman is a 32 y.o. male who presents to the ED for evaluation of Chest Pain (w)   I review a multitude of ED visits in the past couple years for chronic sinusitis, atypical chest pain and many other various symptoms. Seen at [facility] ED on 11/23 and 11/27 for similar symptoms. Negative troponins, normal CBC and metabolic panels on these visits  Patient presents for evaluation of recurrence of left-sided chest discomfort just in the past couple hours.  This makes him feel somewhat short of breath.  No cough.  He reports frustration that he still has recurrence of all of these symptoms.  He does not have a PCP and has not followed up with anyone in the clinic.  Expresses some interest in being established with a PCP and a cardiologist.   Physical Exam   Triage Vital Signs: ED Triage Vitals  Encounter Vitals Group     BP 03/14/23 0434 117/87     Systolic BP Percentile --      Diastolic BP Percentile --      Pulse Rate 03/14/23 0437 89     Resp 03/14/23 0437 (!) 21     Temp 03/14/23 0437 98.4 F (36.9 C)     Temp Source 03/14/23 0437 Oral     SpO2 03/14/23 0437 96 %     Weight 03/14/23 0433 200 lb (90.7 kg)     Height 03/14/23 0433 6' (1.829 m)     Head Circumference --      Peak Flow --      Pain Score 03/14/23 0432 5     Pain Loc --      Pain Education --      Exclude from Growth Chart --     Most recent vital signs: Vitals:   03/14/23 0437 03/14/23 0437  BP:    Pulse: 89   Resp: (!) 21   Temp:  98.4 F (36.9 C)  SpO2: 96%     General: Awake, no distress.  CV:  Good peripheral perfusion.  Resp:  Normal effort.  Abd:  No distention.  MSK:  No deformity noted.  Reproducible chest pain on palpation to anterior left chest around T6/7 without overlying skin changes.   Neuro:  No focal deficits appreciated. Other:     ED Results / Procedures / Treatments   Labs (all labs ordered are listed, but only abnormal results are displayed) Labs Reviewed - No data to display  EKG Sinus rhythm with a rate of 80 bpm.  Normal axis and intervals.  No evidence of acute ischemia.  RADIOLOGY CXR interpreted by me without evidence of acute cardiopulmonary pathology.  Official radiology report(s): DG Chest 2 View  Result Date: 03/14/2023 CLINICAL DATA:  Chest pain that awoke the patient from sleep. EXAM: CHEST - 2 VIEW COMPARISON:  12/05/2022 FINDINGS: Normal heart size and mediastinal contours. No acute infiltrate or edema. No effusion or pneumothorax. No acute osseous findings. IMPRESSION: Negative chest. Electronically Signed   By: Tiburcio Pea M.D.   On: 03/14/2023 05:26    PROCEDURES and INTERVENTIONS:  Procedures  Medications  ketorolac (TORADOL) 30 MG/ML injection 30 mg (30 mg Intramuscular Not Given 03/14/23 0457)     IMPRESSION / MDM / ASSESSMENT AND PLAN / ED  COURSE  I reviewed the triage vital signs and the nursing notes.  Differential diagnosis includes, but is not limited to, ACS, PTX, PNA, muscle strain/spasm, PE, dissection, anxiety, pleural effusion  {Patient presents with symptoms of an acute illness or injury that is potentially life-threatening.  Patient presents for evaluation of atypical reproducible chest pain with a benign workup and suitable for outpatient management.  Frequent ED visits and blood work just a few days ago was normal.  Considering his atypical symptoms, extensive workup, low risk status and reproducible symptoms on palpation I do suspect MSK pain.  His EKG and CXR are fine.  Offered Toradol but he refused.  Discharged with referral to PCP and cardiology.      FINAL CLINICAL IMPRESSION(S) / ED DIAGNOSES   Final diagnoses:  Other chest pain     Rx / DC Orders   ED Discharge Orders          Ordered     Ambulatory Referral to Primary Care (Establish Care)       Comments: Frequent ED visits for chronic chest pains, sinusitis   03/14/23 0536    Ambulatory referral to Cardiology       Comments: If you have not heard from the Cardiology office within the next 72 hours please call [phone].   03/14/23 0536             Note:  This document was prepared using Dragon voice recognition software and may include unintentional dictation errors.   Delton Prairie, MD 03/14/23 [phone]

## 2023-03-18 DIAGNOSIS — R079 Chest pain, unspecified: Secondary | ICD-10-CM | POA: Diagnosis not present

## 2023-03-18 DIAGNOSIS — R0602 Shortness of breath: Secondary | ICD-10-CM | POA: Diagnosis not present

## 2023-03-18 DIAGNOSIS — R519 Headache, unspecified: Secondary | ICD-10-CM | POA: Diagnosis not present

## 2023-03-18 DIAGNOSIS — F17210 Nicotine dependence, cigarettes, uncomplicated: Secondary | ICD-10-CM | POA: Diagnosis not present

## 2023-06-06 IMAGING — CR DG CHEST 2V
2 series · 2 of 2 positions shown · non-contrast
Comparison: 05/24/2021

CLINICAL DATA: Chest pain

EXAM:
CHEST - 2 VIEW

[chest pa]
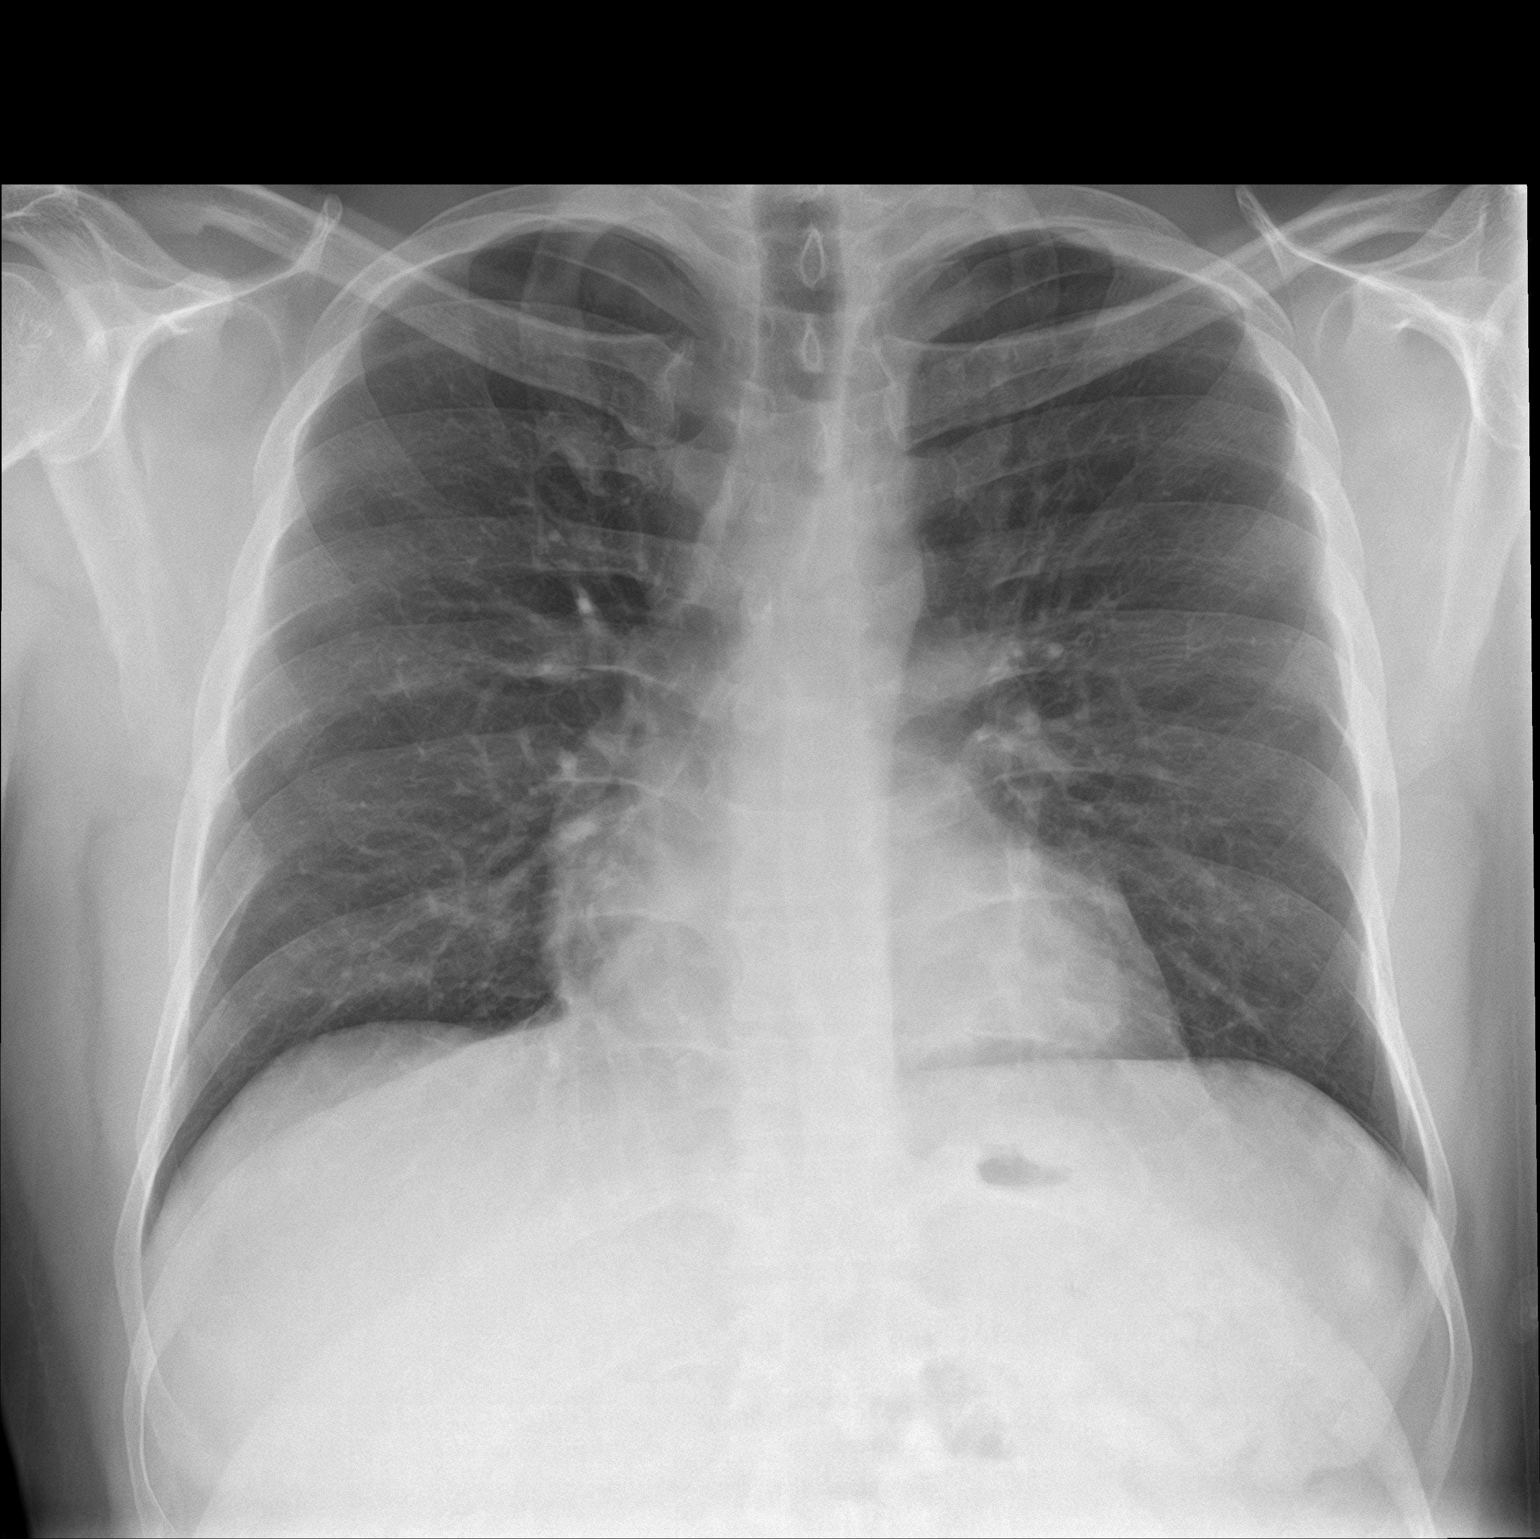

[chest lat]
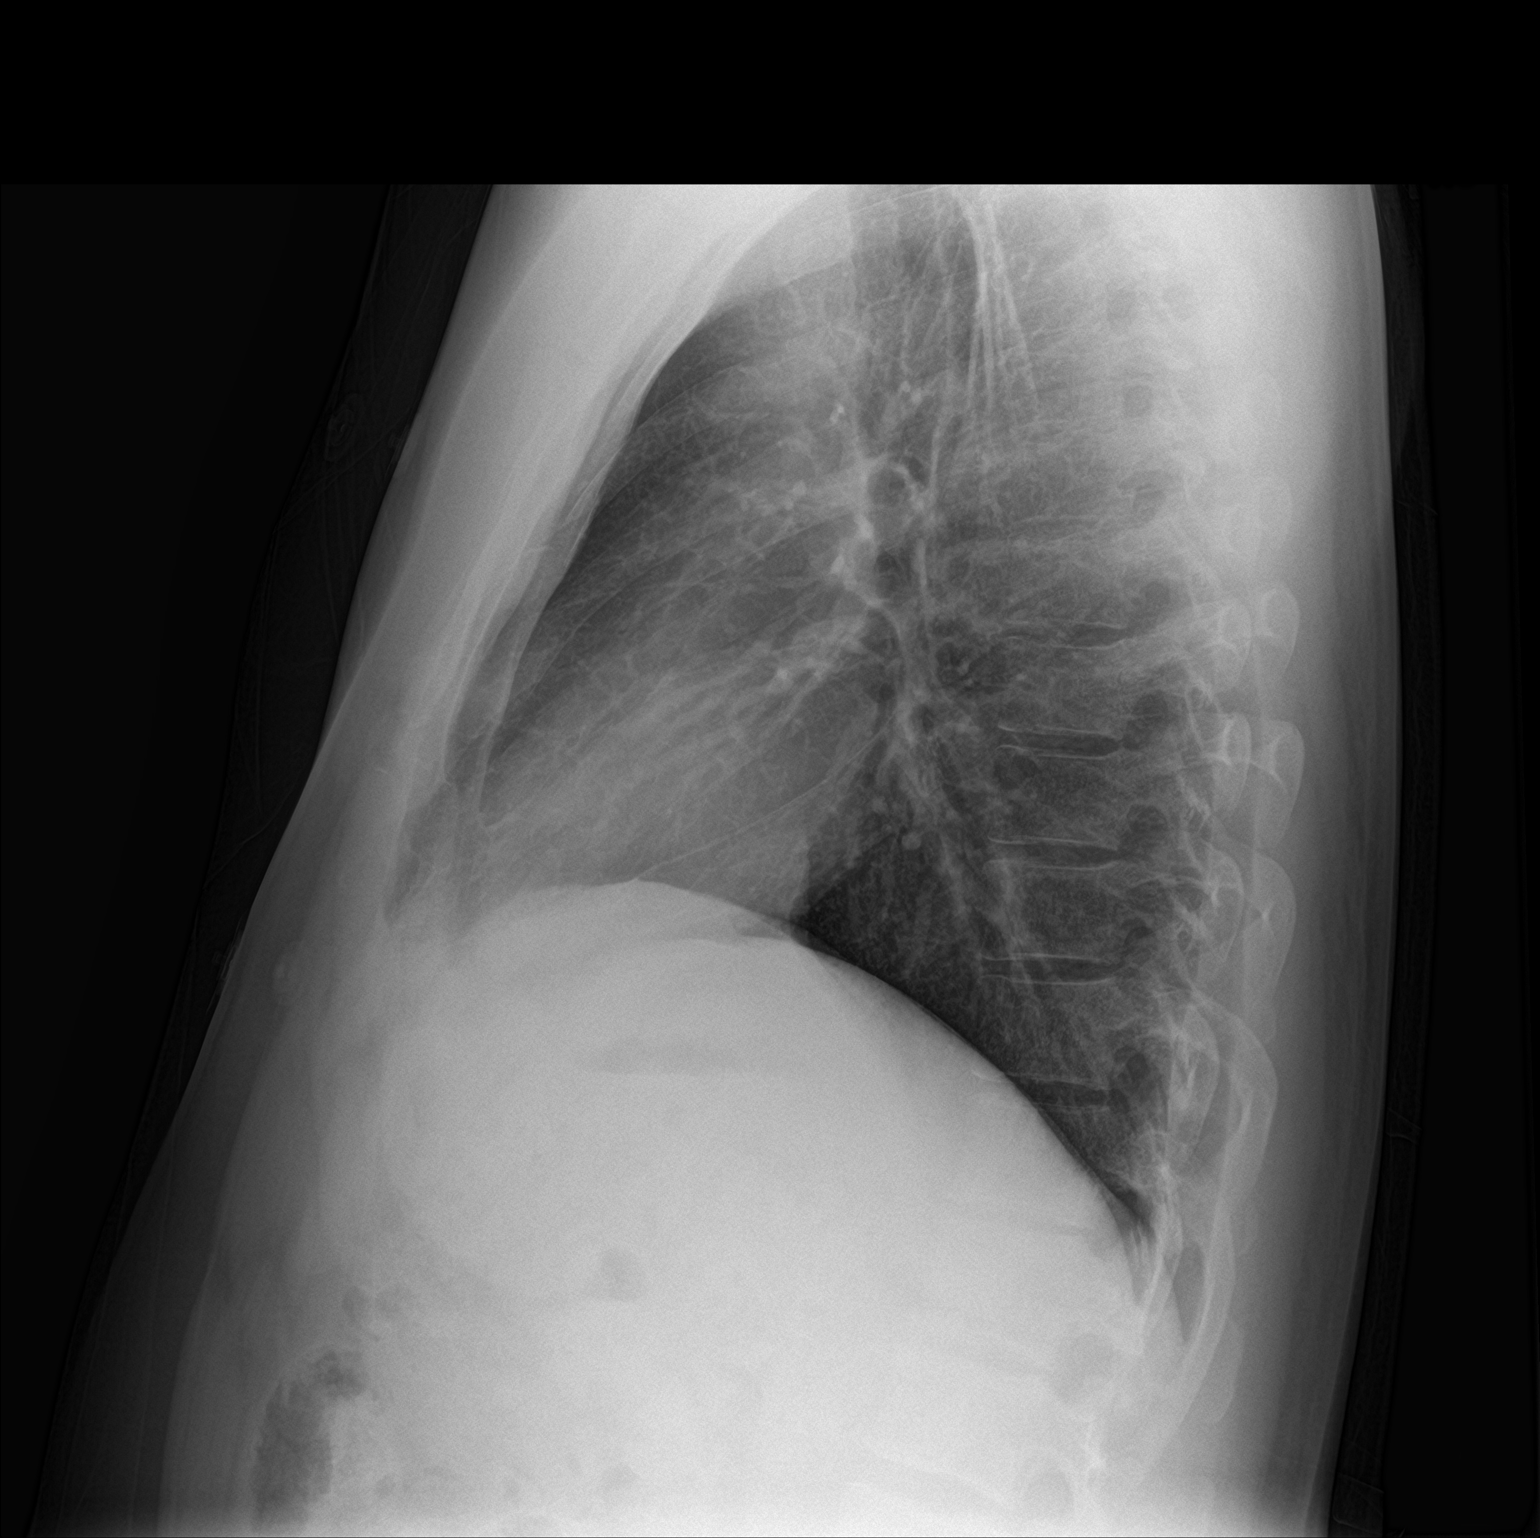

[2 of 2 positions shown; findings below may reference images not displayed]

FINDINGS: The heart size and mediastinal contours are within normal limits.
Both lungs are clear. The visualized skeletal structures are
unremarkable.
IMPRESSION: No active cardiopulmonary disease.

## 2023-07-10 ENCOUNTER — Emergency Department
Admission: EM | Admit: 2023-07-10 | Discharge: 2023-07-10 | Disposition: A | Discharge status: Home / Self Care | Attending: Emergency Medicine | Admitting: Emergency Medicine

## 2023-07-10 ENCOUNTER — Emergency Department

## 2023-07-10 DIAGNOSIS — R059 Cough, unspecified: Secondary | ICD-10-CM | POA: Diagnosis not present

## 2023-07-10 DIAGNOSIS — F17200 Nicotine dependence, unspecified, uncomplicated: Secondary | ICD-10-CM | POA: Diagnosis not present

## 2023-07-10 DIAGNOSIS — J101 Influenza due to other identified influenza virus with other respiratory manifestations: Secondary | ICD-10-CM

## 2023-07-10 DIAGNOSIS — R0789 Other chest pain: Secondary | ICD-10-CM | POA: Diagnosis not present

## 2023-07-10 DIAGNOSIS — R509 Fever, unspecified: Secondary | ICD-10-CM | POA: Diagnosis not present

## 2023-07-10 DIAGNOSIS — R0989 Other specified symptoms and signs involving the circulatory and respiratory systems: Secondary | ICD-10-CM | POA: Diagnosis not present

## 2023-07-10 LAB — COMPREHENSIVE METABOLIC PANEL WITH GFR
ALT: 26 U/L (ref 0–44)
AST: 22 U/L (ref 15–41)
Albumin: 3.6 g/dL (ref 3.5–5.0)
Alkaline Phosphatase: 55 U/L (ref 38–126)
Anion gap: 7 (ref 5–15)
BUN: 9 mg/dL (ref 6–20)
CO2: 24 mmol/L (ref 22–32)
Calcium: 8.1 mg/dL — ABNORMAL LOW (ref 8.9–10.3)
Chloride: 104 mmol/L (ref 98–111)
Creatinine, Ser: 0.93 mg/dL (ref 0.61–1.24)
GFR, Estimated: >60 mL/min (ref >60)
Glucose, Bld: 120 mg/dL — ABNORMAL HIGH (ref 70–99)
Potassium: 3.3 mmol/L — ABNORMAL LOW (ref 3.5–5.1)
Sodium: 135 mmol/L (ref 135–145)
Total Bilirubin: 0.4 mg/dL (ref 0.0–1.2)
Total Protein: 6.9 g/dL (ref 6.5–8.1)

## 2023-07-10 LAB — CBC WITH DIFFERENTIAL/PLATELET
Abs Immature Granulocytes: 0.03 10*3/uL (ref 0.00–0.07)
Basophils Absolute: 0 10*3/uL (ref 0.0–0.1)
Basophils Relative: 0 %
Eosinophils Absolute: 0 10*3/uL (ref 0.0–0.5)
Eosinophils Relative: 0 %
HCT: 43.2 % (ref 39.0–52.0)
Hemoglobin: 14.3 g/dL (ref 13.0–17.0)
Immature Granulocytes: 0 %
Lymphocytes Relative: 31 %
Lymphs Abs: 2.4 10*3/uL (ref 0.7–4.0)
MCH: 27.7 pg (ref 26.0–34.0)
MCHC: 33.1 g/dL (ref 30.0–36.0)
MCV: 83.7 fL (ref 80.0–100.0)
Monocytes Absolute: 0.5 10*3/uL (ref 0.1–1.0)
Monocytes Relative: 7 %
Neutro Abs: 4.8 10*3/uL (ref 1.7–7.7)
Neutrophils Relative %: 62 %
Platelets: 143 10*3/uL — ABNORMAL LOW (ref 150–400)
RBC: 5.16 MIL/uL (ref 4.22–5.81)
RDW: 13.7 % (ref 11.5–15.5)
WBC: 7.8 10*3/uL (ref 4.0–10.5)
nRBC: 0 % (ref 0.0–0.2)

## 2023-07-10 LAB — RESP PANEL BY RT-PCR (RSV, FLU A&B, COVID)  RVPGX2
Influenza A by PCR: NEGATIVE
Influenza B by PCR: POSITIVE — AB
Resp Syncytial Virus by PCR: NEGATIVE
SARS Coronavirus 2 by RT PCR: NEGATIVE

## 2023-07-10 LAB — TROPONIN I (HIGH SENSITIVITY): Troponin I (High Sensitivity): 4 ng/L (ref <18)

## 2023-07-10 MED ORDER — SODIUM CHLORIDE 0.9 % IV BOLUS (SEPSIS): 1000.0000 mL | Freq: Once | INTRAVENOUS | Status: DC

## 2023-07-10 MED ORDER — KETOROLAC TROMETHAMINE 30 MG/ML IJ SOLN
30.0000 mg | Freq: Once | INTRAMUSCULAR | Status: DC
  Filled 2023-07-10: qty 1

## 2023-07-10 NOTE — ED Provider Notes (Signed)
 [facility] Provider Note    Event Date/Time   First MD Initiated Contact with Patient 07/10/23 [phone]     (approximate)   History   Chest Pain   HPI  Patrick Huffman is a 33 y.o. male with no significant past medical history who presents to the emergency department with 3 days of fevers, cough, congestion, nausea, body aches.  No vomiting or diarrhea.  States he is having burning in his chest worse with coughing and now started having pain and numbness down the left arm which concerned him.  Denies history of hypertension, diabetes, hyperlipidemia, CAD.  He is a smoker.  No history of PE, exogenous estrogen use, recent fractures, surgery, trauma, hospitalization, prolonged travel or other immobilization. No lower extremity swelling or pain. No calf tenderness.  He states he was told that he previously had a "blood clot" in the left upper extremity after a peripheral IV placement.  He is unable to tell me if this was a DVT or superficial thrombophlebitis.  He states it was diagnosed here at [facility].  Prior upper extremity ultrasounds in our system are negative for any acute abnormality.  He denies any recent injury, procedure, IV placement in this left arm.  States he is taking over-the-counter medications without relief.  History provided by patient, wife.    Past Medical History:  Diagnosis Date   Back pain    Heart murmur    Hernia, inguinal     No past surgical history on file.  MEDICATIONS:  Prior to Admission medications   Medication Sig Start Date End Date Taking? Authorizing Provider  albuterol (VENTOLIN HFA) 108 (90 Base) MCG/ACT inhaler Inhale 2 puffs into the lungs every 6 (six) hours as needed for wheezing or shortness of breath. 04/14/20   Lucy Chris, PA  alum & mag hydroxide-simeth (MAALOX MAX) 400-400-40 MG/5ML suspension Take 5 mLs by mouth every 6 (six) hours as needed for indigestion. 04/02/20   Nita Sickle, MD  famotidine  (PEPCID) 20 MG tablet Take 1 tablet (20 mg total) by mouth 2 (two) times daily. 01/10/23   Sharman Cheek, MD  loratadine (CLARITIN) 10 MG tablet Take 1 tablet (10 mg total) by mouth daily. 01/10/23 01/10/24  Sharman Cheek, MD  naproxen (NAPROSYN) 500 MG tablet Take 1 tablet (500 mg total) by mouth 2 (two) times daily with a meal. 01/10/23   Sharman Cheek, MD  pantoprazole (PROTONIX) 40 MG tablet Take 1 tablet (40 mg total) by mouth daily. 02/12/23 04/13/23  Chesley Noon, MD  predniSONE (STERAPRED UNI-PAK 21 TAB) 10 MG (21) TBPK tablet 6 tablets on day 1, then 5 tablets on day 2, then 4 tablets on day 3, then 3 tablets on day 4, then 2 tablets on day 5, then 1 tablet on day 6. 01/10/23   Sharman Cheek, MD  traMADol (ULTRAM) 50 MG tablet Take 1 tablet (50 mg total) by mouth every 6 (six) hours as needed. 02/16/23 02/16/24  Jene Every, MD  cetirizine (ZYRTEC) 10 MG tablet Take 1 tablet (10 mg total) by mouth daily. Patient not taking: Reported on 11/26/2018 01/27/16 10/05/19  Cuthriell, Delorise Royals, PA-C  dicyclomine (BENTYL) 20 MG tablet Take 1 tablet (20 mg total) by mouth 3 (three) times daily as needed for spasms. 04/12/19 10/05/19  Minna Antis, MD  promethazine (PHENERGAN) 25 MG tablet Take 1 tablet (25 mg total) by mouth every 6 (six) hours as needed for nausea or vomiting. Patient not taking: Reported on 11/26/2018  05/07/16 10/05/19  Evangeline Dakin, PA-C    Physical Exam   Triage Vital Signs: ED Triage Vitals  Encounter Vitals Group     BP 07/10/23 0355 125/66     Systolic BP Percentile --      Diastolic BP Percentile --      Pulse Rate 07/10/23 0355 84     Resp 07/10/23 0355 17     Temp 07/10/23 0353 98.2 F (36.8 C)     Temp Source 07/10/23 0353 Oral     SpO2 07/10/23 0355 97 %     Weight --      Height --      Head Circumference --      Peak Flow --      Pain Score --      Pain Loc --      Pain Education --      Exclude from Growth Chart --     Most recent  vital signs: Vitals:   07/10/23 0400 07/10/23 0500  BP: 119/75 126/69  Pulse: 80 72  Resp: 18 19  Temp:    SpO2: 98% 100%    CONSTITUTIONAL: Alert, responds appropriately to questions. Well-appearing; well-nourished HEAD: Normocephalic, atraumatic EYES: Conjunctivae clear, pupils appear equal, sclera nonicteric ENT: normal nose; moist mucous membranes NECK: Supple, normal ROM CARD: RRR; S1 and S2 appreciated RESP: Normal chest excursion without splinting or tachypnea; breath sounds clear and equal bilaterally; no wheezes, no rhonchi, no rales, no hypoxia or respiratory distress, speaking full sentences ABD/GI: Non-distended; soft, non-tender, no rebound, no guarding, no peritoneal signs BACK: The back appears normal EXT: Normal ROM in all joints; no deformity noted, no edema, no calf tenderness or calf swelling, compartments in left arm are soft, 2+ left radial pulse, normal capillary refill, no pain with palpation over the left arm, no increased redness or warmth in the left arm, no swelling noted and no asymmetry SKIN: Normal color for age and race; warm; no rash on exposed skin NEURO: Moves all extremities equally, normal speech PSYCH: The patient's mood and manner are appropriate.   ED Results / Procedures / Treatments   LABS: (all labs ordered are listed, but only abnormal results are displayed) Labs Reviewed  RESP PANEL BY RT-PCR (RSV, FLU A&B, COVID)  RVPGX2 - Abnormal; Notable for the following components:      Result Value   Influenza B by PCR POSITIVE (*)    All other components within normal limits  CBC WITH DIFFERENTIAL/PLATELET - Abnormal; Notable for the following components:   Platelets 143 (*)    All other components within normal limits  COMPREHENSIVE METABOLIC PANEL WITH GFR - Abnormal; Notable for the following components:   Potassium 3.3 (*)    Glucose, Bld 120 (*)    Calcium 8.1 (*)    All other components within normal limits  TROPONIN I (HIGH  SENSITIVITY)     EKG:  EKG Interpretation Date/Time:  Saturday July 10 2023 03:52:50 EDT Ventricular Rate:  84 PR Interval:  123 QRS Duration:  91 QT Interval:  347 QTC Calculation: 411 R Axis:   73  Text Interpretation: Sinus rhythm Baseline wander in lead(s) V3 Confirmed by Rochele Raring [phone]) on 07/10/2023 3:58:44 AM         RADIOLOGY: My personal review and interpretation of imaging: Chest x-ray clear.  I have personally reviewed all radiology reports.   DG Chest 2 View Result Date: 07/10/2023 CLINICAL DATA:  Fever, cough, and congestion EXAM: CHEST -  2 VIEW COMPARISON:  03/14/2023 FINDINGS: Normal heart size and mediastinal contours. No acute infiltrate or edema. No effusion or pneumothorax. No acute osseous findings. Artifact from EKG pads. IMPRESSION: No active cardiopulmonary disease. Electronically Signed   By: Tiburcio Pea M.D.   On: 07/10/2023 04:41     PROCEDURES:  Critical Care performed: No      Procedures    IMPRESSION / MDM / ASSESSMENT AND PLAN / ED COURSE  I reviewed the triage vital signs and the nursing notes.    Patient here with flulike symptoms, burning chest pain worse with coughing.  The patient is on the cardiac monitor to evaluate for evidence of arrhythmia and/or significant heart rate changes.   DIFFERENTIAL DIAGNOSIS (includes but not limited to):   Influenza, other viral URI, pneumonia, bronchitis, musculoskeletal chest pain, less likely ACS, PE, dissection, CHF, stroke, pneumothorax   Patient's presentation is most consistent with acute presentation with potential threat to life or bodily function.   PLAN: EKG nonischemic.  Will obtain labs, COVID and flu swab, chest x-ray.  Will give Toradol, fluids for symptomatic relief.   MEDICATIONS GIVEN IN ED: Medications  sodium chloride 0.9 % bolus 1,000 mL (1,000 mLs Intravenous Patient Refused/Not Given 07/10/23 0434)  ketorolac (TORADOL) 30 MG/ML injection 30 mg (30 mg  Intravenous Patient Refused/Not Given 07/10/23 0434)     ED COURSE: Patient refused Toradol, IV fluids.  Labs show no leukocytosis or leukopenia.  Normal hemoglobin, electrolytes.  Negative troponin.  Chest x-ray reviewed and interpreted by myself and the radiologist and shows no acute abnormality.  Patient is positive for influenza B.  Outside of treatment window for Tamiflu, Xofluza given symptoms ongoing for 3 days.  Discussed supportive care instructions, return precautions.  I feel patient is safe for discharge.  Will provide with work note.   At this time, I do not feel there is any life-threatening condition present. I reviewed all nursing notes, vitals, pertinent previous records.  All lab and urine results, EKGs, imaging ordered have been independently reviewed and interpreted by myself.  I reviewed all available radiology reports from any imaging ordered this visit.  Based on my assessment, I feel the patient is safe to be discharged home without further emergent workup and can continue workup as an outpatient as needed. Discussed all findings, treatment plan as well as usual and customary return precautions.  They verbalize understanding and are comfortable with this plan.  Outpatient follow-up has been provided as needed.  All questions have been answered.    CONSULTS:  none   OUTSIDE RECORDS REVIEWED: Reviewed prior ENT note in September 2024.       FINAL CLINICAL IMPRESSION(S) / ED DIAGNOSES   Final diagnoses:  Atypical chest pain  Influenza B     Rx / DC Orders   ED Discharge Orders          Ordered    Ambulatory Referral to Primary Care (Establish Care)        07/10/23 0550             Note:  This document was prepared using Dragon voice recognition software and may include unintentional dictation errors.   Brittny Spangle, Layla Maw, DO 07/10/23 [phone]

## 2023-07-10 NOTE — ED Notes (Signed)
 CCMD called for cardiac monitoring.

## 2023-07-10 NOTE — ED Notes (Signed)
 This RN went into pts room to start IV and draw labwork. PT refused to let this RN place IV stating he didn't want the IV fluids or toradol. This RN provided pt education to the benefits of having the IV fluids, medications, and IV access. Pt still declined. Pt was straight stuck for labs.

## 2023-07-10 NOTE — ED Notes (Signed)
 ED provider at bedside.

## 2023-07-10 NOTE — Discharge Instructions (Signed)
 You may alternate Tylenol 1000 mg every 6 hours as needed for fever and pain and ibuprofen 800 mg every 6-8 hours as needed for fever and pain. Please rest and drink plenty of fluids. This is a viral illness causing your symptoms. You do not need antibiotics for a virus. You may use over-the-counter nasal saline spray and Afrin nasal saline spray as needed for nasal congestion. Please do not use Afrin for more than 3 days in a row. You may use guaifenesin and dextromethorphan as needed for cough.  You may use lozenges and Chloraseptic spray to help with sore throat.  Warm salt water gargles can also help with sore throat.  You may use over-the-counter Unisom (doxyalamine) or Benadryl (diphenhydramine) to help with sleep.  Please note that some combination medicines such as DayQuil and NyQuil have multiple medications in them.  Please make sure you look at all labels to ensure that you are not taking too much of any one particular medication.  Symptoms from a virus may take 7-14 days to run its course.  You may also use honey and salt water gargles to help with sore throat, cough, postnasal drainage.  The flu is treated like any other virus with supportive measures as listed above. At this time you are outside the treatment window for Tamiflu or Xofluza. These medications have to be taken within the first 48 hours of symptoms.  Tamiflu has many side effects including nausea, vomiting and diarrhea.

## 2023-07-10 NOTE — ED Triage Notes (Signed)
 Pt to ED with central chest pain and reports of "feeling sick" for two days with new onset of left arm pain 30 minutes ago. Pt endorses SOB. Pt is A&O x4 with even unlabored respirations at this time.

## 2023-08-21 ENCOUNTER — Emergency Department: Admission: EM | Admit: 2023-08-21 | Discharge: 2023-08-21 | Disposition: A | Discharge status: Home / Self Care

## 2023-08-21 ENCOUNTER — Other Ambulatory Visit: Payer: Self-pay

## 2023-08-21 ENCOUNTER — Emergency Department

## 2023-08-21 DIAGNOSIS — R079 Chest pain, unspecified: Secondary | ICD-10-CM | POA: Diagnosis not present

## 2023-08-21 DIAGNOSIS — R42 Dizziness and giddiness: Secondary | ICD-10-CM | POA: Insufficient documentation

## 2023-08-21 DIAGNOSIS — T40605A Adverse effect of unspecified narcotics, initial encounter: Secondary | ICD-10-CM | POA: Insufficient documentation

## 2023-08-21 DIAGNOSIS — F17200 Nicotine dependence, unspecified, uncomplicated: Secondary | ICD-10-CM | POA: Diagnosis not present

## 2023-08-21 DIAGNOSIS — R072 Precordial pain: Secondary | ICD-10-CM | POA: Diagnosis present

## 2023-08-21 DIAGNOSIS — R0789 Other chest pain: Secondary | ICD-10-CM | POA: Diagnosis not present

## 2023-08-21 DIAGNOSIS — R112 Nausea with vomiting, unspecified: Secondary | ICD-10-CM

## 2023-08-21 DIAGNOSIS — R1013 Epigastric pain: Secondary | ICD-10-CM | POA: Diagnosis not present

## 2023-08-21 LAB — RESP PANEL BY RT-PCR (RSV, FLU A&B, COVID)  RVPGX2
Influenza A by PCR: NEGATIVE
Influenza B by PCR: NEGATIVE
Resp Syncytial Virus by PCR: NEGATIVE
SARS Coronavirus 2 by RT PCR: NEGATIVE

## 2023-08-21 LAB — COMPREHENSIVE METABOLIC PANEL WITH GFR
ALT: 32 U/L (ref 0–44)
AST: 25 U/L (ref 15–41)
Albumin: 4 g/dL (ref 3.5–5.0)
Alkaline Phosphatase: 66 U/L (ref 38–126)
Anion gap: 9 (ref 5–15)
BUN: 17 mg/dL (ref 6–20)
CO2: 26 mmol/L (ref 22–32)
Calcium: 8.9 mg/dL (ref 8.9–10.3)
Chloride: 103 mmol/L (ref 98–111)
Creatinine, Ser: 1.11 mg/dL (ref 0.61–1.24)
GFR, Estimated: >60 mL/min (ref >60)
Glucose, Bld: 101 mg/dL — ABNORMAL HIGH (ref 70–99)
Potassium: 3.6 mmol/L (ref 3.5–5.1)
Sodium: 138 mmol/L (ref 135–145)
Total Bilirubin: 0.8 mg/dL (ref 0.0–1.2)
Total Protein: 7.3 g/dL (ref 6.5–8.1)

## 2023-08-21 LAB — CBC
HCT: 42.7 % (ref 39.0–52.0)
Hemoglobin: 14.1 g/dL (ref 13.0–17.0)
MCH: 28.1 pg (ref 26.0–34.0)
MCHC: 33 g/dL (ref 30.0–36.0)
MCV: 85.1 fL (ref 80.0–100.0)
Platelets: 241 10*3/uL (ref 150–400)
RBC: 5.02 MIL/uL (ref 4.22–5.81)
RDW: 13.6 % (ref 11.5–15.5)
WBC: 7.6 10*3/uL (ref 4.0–10.5)
nRBC: 0 % (ref 0.0–0.2)

## 2023-08-21 LAB — LIPASE, BLOOD: Lipase: 31 U/L (ref 11–51)

## 2023-08-21 LAB — TROPONIN I (HIGH SENSITIVITY): Troponin I (High Sensitivity): 4 ng/L (ref <18)

## 2023-08-21 MED ORDER — FAMOTIDINE 20 MG PO TABS
20.0000 mg | ORAL_TABLET | Freq: Once | ORAL | Status: AC
  Administered 2023-08-21: 20 mg via ORAL
  Filled 2023-08-21: qty 1

## 2023-08-21 MED ORDER — ONDANSETRON 4 MG PO TBDP
4.0000 mg | ORAL_TABLET | Freq: Once | ORAL | Status: AC
  Administered 2023-08-21: 4 mg via ORAL
  Filled 2023-08-21: qty 1

## 2023-08-21 MED ORDER — ONDANSETRON 4 MG PO TBDP
4.0000 mg | ORAL_TABLET | Freq: Three times a day (TID) | ORAL | 0 refills | Status: AC | PRN
Start: 2023-08-21 — End: ?

## 2023-08-21 MED ORDER — ACETAMINOPHEN 500 MG PO TABS
1000.0000 mg | ORAL_TABLET | Freq: Once | ORAL | Status: AC
  Administered 2023-08-21: 1000 mg via ORAL
  Filled 2023-08-21: qty 2

## 2023-08-21 MED ORDER — FAMOTIDINE 20 MG PO TABS: 20.0000 mg | ORAL_TABLET | Freq: Two times a day (BID) | ORAL | 0 refills | Status: AC

## 2023-08-21 MED ORDER — ALUM & MAG HYDROXIDE-SIMETH 200-200-20 MG/5ML PO SUSP
30.0000 mL | Freq: Once | ORAL | Status: DC
  Filled 2023-08-21: qty 30

## 2023-08-21 MED ORDER — NALOXONE HCL 4 MG/0.1ML NA LIQD: 1.0000 | Freq: Once | NASAL | 0 refills | Status: AC

## 2023-08-21 NOTE — ED Triage Notes (Signed)
 Patient comes in via pov with complaints of chest pain for the past hour and a half or so. Pt states that it started out with cotton mouth. Then he started feeling sharp pains in the center of his chest that radiates down his left arm, and under his right arm pit. Pt also complains of nausea and dizziness when he closes his eyes. Pt is alert and oriented x4 with no signs of acute distress at this time.

## 2023-08-21 NOTE — ED Provider Notes (Signed)
 [facility] Provider Note    None    (approximate)   History   Chest Pain  Patient comes in via pov with complaints of chest pain for the past hour and a half or so. Pt states that it started out with cotton mouth. Then he started feeling sharp pains in the center of his chest that radiates down his left arm, and under his right arm pit. Pt also complains of nausea and dizziness when he closes his eyes. Pt is alert and oriented x4 with no signs of acute distress at this time.   HPI Patrick Huffman is a 33 y.o. male no significant past medical history presents for evaluation of chest pain - Present for 2-3 hours, notes substernal discomfort, had radiation toward bilateral armpits and then vomited.  Does have some ongoing discomfort. -Father had heart attack at age 56.  Patient is a smoker. -Also notes some epigastric discomfort      Physical Exam   Triage Vital Signs: BP 114/77   Pulse 77   Temp 97.9 F (36.6 C)   Resp (!) 25   Ht 6' (1.829 m)   Wt 90.7 kg   SpO2 99%   BMI 27.12 kg/m     Most recent vital signs: Vitals:   08/21/23 1148 08/21/23 1200  BP: 114/77   Pulse: 70 77  Resp: 18 (!) 25  Temp: 97.9 F (36.6 C)   SpO2: 95% 99%     General: Awake, no distress.  CV:  Good peripheral perfusion. RRR, RP 2+ Resp:  Normal effort. CTAB Abd:  No distention.  No tenderness to palpation in epigastrium only.  Negative Murphys sign.   ED Results / Procedures / Treatments   Labs (all labs ordered are listed, but only abnormal results are displayed) Labs Reviewed  COMPREHENSIVE METABOLIC PANEL WITH GFR - Abnormal; Notable for the following components:      Result Value   Glucose, Bld 101 (*)    All other components within normal limits  RESP PANEL BY RT-PCR (RSV, FLU A&B, COVID)  RVPGX2  CBC  LIPASE, BLOOD  TROPONIN I (HIGH SENSITIVITY)     EKG  See ED course below.   RADIOLOGY See ED course  below.    PROCEDURES:  Critical Care performed: No  Procedures   MEDICATIONS ORDERED IN ED: Medications  alum & mag hydroxide-simeth (MAALOX/MYLANTA) 200-200-20 MG/5ML suspension 30 mL (30 mLs Oral Patient Refused/Not Given 08/21/23 1209)  ondansetron  (ZOFRAN -ODT) disintegrating tablet 4 mg (4 mg Oral Given 08/21/23 1207)  acetaminophen  (TYLENOL ) tablet 1,000 mg (1,000 mg Oral Given 08/21/23 1207)  famotidine  (PEPCID ) tablet 20 mg (20 mg Oral Given 08/21/23 1207)     IMPRESSION / MDM / ASSESSMENT AND PLAN / ED COURSE  I reviewed the triage vital signs and the nursing notes.                              DDX/MDM/AP: Differential diagnosis includes, but is not limited to, likely gastritis/viral syndrome/dyspepsia, considered but doubt upper abdominal pathology including cholecystitis or pancreatitis.  Doubt ACS, PE, pneumonia.  Do not suspect aortic dissection or pneumothorax.  Plan: - Labs - EKG - Chest x-ray - Zofran , GI cocktail -no indication for advanced abdominal imaging at this time - Reassess  Patient's presentation is most consistent with acute presentation with potential threat to life or bodily function.  The patient is on the cardiac monitor  to evaluate for evidence of arrhythmia and/or significant heart rate changes.  ED course below.  Workup unremarkable.  Patient later revealed that he had taken a narcotic that was given to him by his father-in-law earlier today for back pain, no back pain red flags on my read eval.  Fortunately no evidence of overdose at this time, is several hours out and likely vomited out much of the medication.  Do suspect nausea and vomiting most likely secondary to narcotic use.  Consider concomitant gastritis/dyspepsia, Rx famotidine  and Zofran .  Also Rx Narcan and discussed extensively dangers of taking unknown pills from other people and the dangers of narcotics.  Plan for PMD follow-up.  ED return precautions in place.  Patient agrees with  plan.  Clinical Course as of 08/21/23 1308  Sat Aug 21, 2023  1152 Ecg = sinus rhythm, rate 66, no ST elevation or depression, no significant repolarization abnormality, normal axis, normal intervals.  No evidence of ischemia or arrhythmia on my interpretation. [MM]  1202 Cbc wnl [MM]  1214 Cmp, lipase wnl [MM]  1230 Chest x-ray reviewed, unremarkable on my interpretation, no consolidation or pneumothorax nor widened mediastinum. [MM]  1248 Trop wnl [MM]  1256 Patient reevaluated, confirms chest discomfort started around 9 AM.  No indication for repeat troponin at this time.  Patient also tells me now that he did take a Percocet that was offered to him by his father-in-law for some back pain he has been having recently after mechanical fall.  No midline back pain.  Took the Percocet about 30 minutes prior to onset of his nausea/vomiting and sleepiness.  No head trauma.  Started looking this up on his phone and became concerned about it so decided to tell me.  Awake, alert, normal respiratory rate.  Counseled on avoiding taking pills from other people and to avoid narcotics altogether, patient in agreement.  Suspect likely had nausea from recent narcotic ingestion.  Is about 4-5 hours out, no clinical concern for overdose at this time and suspect he may have vomited out much of the narcotic.  Brother will drive home home and he will be accompanied by family at home today and is amenable to discharge home.  Do not suspect cardiac etiology of presentation at this time.  Still may have some element of dyspepsia or GI syndrome.  Will Rx famotidine  and plan for outpatient follow-up.  ED return precautions in place.  Patient agrees with plan. [MM]    Clinical Course User Index [MM] Collis Deaner, MD     FINAL CLINICAL IMPRESSION(S) / ED DIAGNOSES   Final diagnoses:  Nausea and vomiting, unspecified vomiting type  Atypical chest pain  Narcotic-induced nausea and vomiting     Rx / DC Orders    ED Discharge Orders          Ordered    ondansetron  (ZOFRAN -ODT) 4 MG disintegrating tablet  Every 8 hours PRN        08/21/23 1300    famotidine  (PEPCID ) 20 MG tablet  2 times daily        08/21/23 1300    naloxone (NARCAN) nasal spray 4 mg/0.1 mL   Once        08/21/23 1305             Note:  This document was prepared using Dragon voice recognition software and may include unintentional dictation errors.   Collis Deaner, MD 08/21/23 1308

## 2023-08-21 NOTE — Discharge Instructions (Addendum)
 Your evaluation in the emergency department was overall reassuring.  I suspect your nausea and vomiting is likely due to your recent accidental use of narcotics.  Never take other peoples prescription pills, and never take any pills if you do not know what they are.  Narcotics frequently cause nausea and vomiting and for people to feel sleepy, and as discussed they can kill you in the event of an overdose.  We are not seeing any signs of that today, but please be cautious in the future.  It is possible you may have some irritation of the lining of your stomach separate from this, and I have prescribed you an antacid antinausea medication to use as needed.  Please do follow-up with your primary care doctor for reevaluation, and return to the emergency department with any new or worsening symptoms.  In case this occurs again, I have prescribed you a single dose of Narcan--this is a spray in the nose that you can use if you are concerned that you or someone around you has overdosed on a narcotic medication.  Go directly to the emergency department or call 911 after Narcan use.

## 2023-08-28 ENCOUNTER — Encounter: Payer: Self-pay | Admitting: Emergency Medicine

## 2023-08-28 ENCOUNTER — Emergency Department
Admission: EM | Admit: 2023-08-28 | Discharge: 2023-08-28 | Disposition: A | Discharge status: Home / Self Care | Attending: Emergency Medicine | Admitting: Emergency Medicine

## 2023-08-28 ENCOUNTER — Other Ambulatory Visit: Payer: Self-pay

## 2023-08-28 DIAGNOSIS — M545 Low back pain, unspecified: Secondary | ICD-10-CM | POA: Diagnosis not present

## 2023-08-28 DIAGNOSIS — M5459 Other low back pain: Secondary | ICD-10-CM | POA: Diagnosis not present

## 2023-08-28 LAB — URINALYSIS, ROUTINE W REFLEX MICROSCOPIC
Bilirubin Urine: NEGATIVE
Glucose, UA: NEGATIVE mg/dL
Hgb urine dipstick: NEGATIVE
Ketones, ur: NEGATIVE mg/dL
Leukocytes,Ua: NEGATIVE
Nitrite: NEGATIVE
Protein, ur: NEGATIVE mg/dL
Specific Gravity, Urine: 1.018 (ref 1.005–1.030)
pH: 5 (ref 5.0–8.0)

## 2023-08-28 LAB — BASIC METABOLIC PANEL WITH GFR
Anion gap: 10 (ref 5–15)
BUN: 13 mg/dL (ref 6–20)
CO2: 27 mmol/L (ref 22–32)
Calcium: 9.1 mg/dL (ref 8.9–10.3)
Chloride: 99 mmol/L (ref 98–111)
Creatinine, Ser: 1.07 mg/dL (ref 0.61–1.24)
GFR, Estimated: >60 mL/min (ref >60)
Glucose, Bld: 96 mg/dL (ref 70–99)
Potassium: 3.9 mmol/L (ref 3.5–5.1)
Sodium: 136 mmol/L (ref 135–145)

## 2023-08-28 LAB — CBC
HCT: 47.4 % (ref 39.0–52.0)
Hemoglobin: 15.7 g/dL (ref 13.0–17.0)
MCH: 27.7 pg (ref 26.0–34.0)
MCHC: 33.1 g/dL (ref 30.0–36.0)
MCV: 83.7 fL (ref 80.0–100.0)
Platelets: 264 10*3/uL (ref 150–400)
RBC: 5.66 MIL/uL (ref 4.22–5.81)
RDW: 13.2 % (ref 11.5–15.5)
WBC: 8 10*3/uL (ref 4.0–10.5)
nRBC: 0 % (ref 0.0–0.2)

## 2023-08-28 MED ORDER — KETOROLAC TROMETHAMINE 30 MG/ML IJ SOLN
30.0000 mg | Freq: Once | INTRAMUSCULAR | Status: DC
  Filled 2023-08-28: qty 1

## 2023-08-28 MED ORDER — ACETAMINOPHEN 500 MG PO TABS
1000.0000 mg | ORAL_TABLET | Freq: Once | ORAL | Status: DC
  Filled 2023-08-28: qty 2

## 2023-08-28 MED ORDER — LIDOCAINE 5 % EX PTCH
1.0000 | MEDICATED_PATCH | CUTANEOUS | Status: DC
  Filled 2023-08-28: qty 1

## 2023-08-28 MED ORDER — METHOCARBAMOL 500 MG PO TABS
500.0000 mg | ORAL_TABLET | Freq: Once | ORAL | Status: DC
  Filled 2023-08-28: qty 1

## 2023-08-28 NOTE — ED Provider Notes (Signed)
 [facility] Provider Note    Event Date/Time   First MD Initiated Contact with Patient 08/28/23 1111     (approximate)   History   Flank Pain   HPI  Patrick Huffman is a 33 y.o. male who presents to the ED for evaluation of Flank Pain   I reviewed various ED visits across multiple systems.  Patient presents to the ED for evaluation of atraumatic bilateral lower back pain that started about 3 hours ago while he was seated in the car.  Pain across his bilateral lower back.  No radiation.  No leg weakness, saddle anesthesias or fevers.  No recent illnesses.  No anterior pain.  Reports he googled his symptoms and questions if he has a kidney stone.  Also reporting years of intermittent chest pain, no pain right now.  No shortness of breath.   Physical Exam   Triage Vital Signs: ED Triage Vitals  Encounter Vitals Group     BP 08/28/23 1035 119/74     Systolic BP Percentile --      Diastolic BP Percentile --      Pulse Rate 08/28/23 1035 74     Resp 08/28/23 1035 18     Temp 08/28/23 1035 98.8 F (37.1 C)     Temp Source 08/28/23 1035 Oral     SpO2 08/28/23 1035 98 %     Weight 08/28/23 1034 200 lb (90.7 kg)     Height 08/28/23 1034 6' (1.829 m)     Head Circumference --      Peak Flow --      Pain Score 08/28/23 1033 8     Pain Loc --      Pain Education --      Exclude from Growth Chart --     Most recent vital signs: Vitals:   08/28/23 1035  BP: 119/74  Pulse: 74  Resp: 18  Temp: 98.8 F (37.1 C)  SpO2: 98%    General: Awake, no distress.  CV:  Good peripheral perfusion.  Resp:  Normal effort.  Abd:  No distention.  MSK:  No deformity noted.  Mild paraspinal lumbar bilateral tenderness to palpation.  No midline or spinal tenderness. Neuro:  No focal deficits appreciated. Other:     ED Results / Procedures / Treatments   Labs (all labs ordered are listed, but only abnormal results are displayed) Labs Reviewed   URINALYSIS, ROUTINE W REFLEX MICROSCOPIC - Abnormal; Notable for the following components:      Result Value   Color, Urine YELLOW (*)    APPearance CLEAR (*)    All other components within normal limits  BASIC METABOLIC PANEL WITH GFR  CBC    EKG   RADIOLOGY   Official radiology report(s): No results found.  PROCEDURES and INTERVENTIONS:  Procedures  Medications  lidocaine  (LIDODERM ) 5 % 1 patch (has no administration in time range)  ketorolac  (TORADOL ) 30 MG/ML injection 30 mg (has no administration in time range)  acetaminophen  (TYLENOL ) tablet 1,000 mg (has no administration in time range)  methocarbamol (ROBAXIN) tablet 500 mg (has no administration in time range)     IMPRESSION / MDM / ASSESSMENT AND PLAN / ED COURSE  I reviewed the triage vital signs and the nursing notes.  Differential diagnosis includes, but is not limited to, kidney stone, lumbar fracture, cauda equina, muscular spasm or strain, sciatica  {Patient presents with symptoms of an acute illness or injury that is potentially life-threatening.  Patient presents with atraumatic low back pain without red flag features suitable for outpatient management with nonnarcotic multimodal analgesia.  Normal vitals.  Mild localized tenderness on exam but otherwise normal..  Normal CBC, renal function and UA.  Doubt acute cardiopulmonary derangements.      FINAL CLINICAL IMPRESSION(S) / ED DIAGNOSES   Final diagnoses:  Acute bilateral low back pain without sciatica     Rx / DC Orders   ED Discharge Orders     None        Note:  This document was prepared using Dragon voice recognition software and may include unintentional dictation errors.   Arline Bennett, MD 08/28/23 [phone]

## 2023-08-28 NOTE — ED Notes (Signed)
 AVS provided to and discussed with patient. Pt verbalizes understanding of discharge instructions and denies any questions or concerns at this time. Pt ambulated out of department independently with steady gait.

## 2023-08-28 NOTE — ED Triage Notes (Addendum)
 Pt via POV from home. Pt c/o bilateral flank pain that started 2 hrs ago, reports burning sensation when he urinates and dark color urine. States the pain caused him to have some intermittent CP from the pain but states he is more concerned about his kidneys. Denies any significant medical hx. Pt is A&Ox4 and NAD, ambulatory with steady gait on arrival.

## 2023-08-28 NOTE — Discharge Instructions (Addendum)
Please take Tylenol and ibuprofen/Advil for your pain.  It is safe to take them together, or to alternate them every few hours.  Take up to 1000mg of Tylenol at a time, up to 4 times per day.  Do not take more than 4000 mg of Tylenol in 24 hours.  For ibuprofen, take 400-600 mg, 3 - 4 times per day.  

## 2023-10-07 ENCOUNTER — Other Ambulatory Visit: Payer: Self-pay

## 2023-10-07 ENCOUNTER — Emergency Department

## 2023-10-07 ENCOUNTER — Encounter: Payer: Self-pay | Admitting: Emergency Medicine

## 2023-10-07 ENCOUNTER — Emergency Department
Admission: EM | Admit: 2023-10-07 | Discharge: 2023-10-07 | Disposition: A | Discharge status: Home / Self Care | Attending: Emergency Medicine | Admitting: Emergency Medicine

## 2023-10-07 DIAGNOSIS — R519 Headache, unspecified: Secondary | ICD-10-CM | POA: Diagnosis not present

## 2023-10-07 DIAGNOSIS — R079 Chest pain, unspecified: Secondary | ICD-10-CM

## 2023-10-07 DIAGNOSIS — R0789 Other chest pain: Secondary | ICD-10-CM | POA: Insufficient documentation

## 2023-10-07 DIAGNOSIS — R29818 Other symptoms and signs involving the nervous system: Secondary | ICD-10-CM | POA: Diagnosis not present

## 2023-10-07 DIAGNOSIS — R202 Paresthesia of skin: Secondary | ICD-10-CM | POA: Diagnosis not present

## 2023-10-07 LAB — CBC
HCT: 46.2 % (ref 39.0–52.0)
Hemoglobin: 15.2 g/dL (ref 13.0–17.0)
MCH: 28 pg (ref 26.0–34.0)
MCHC: 32.9 g/dL (ref 30.0–36.0)
MCV: 85.2 fL (ref 80.0–100.0)
Platelets: 238 10*3/uL (ref 150–400)
RBC: 5.42 MIL/uL (ref 4.22–5.81)
RDW: 13.2 % (ref 11.5–15.5)
WBC: 7.2 10*3/uL (ref 4.0–10.5)
nRBC: 0 % (ref 0.0–0.2)

## 2023-10-07 LAB — BASIC METABOLIC PANEL WITH GFR
Anion gap: 6 (ref 5–15)
BUN: 16 mg/dL (ref 6–20)
CO2: 31 mmol/L (ref 22–32)
Calcium: 8.9 mg/dL (ref 8.9–10.3)
Chloride: 101 mmol/L (ref 98–111)
Creatinine, Ser: 1.04 mg/dL (ref 0.61–1.24)
GFR, Estimated: >60 mL/min (ref >60)
Glucose, Bld: 100 mg/dL — ABNORMAL HIGH (ref 70–99)
Potassium: 4.1 mmol/L (ref 3.5–5.1)
Sodium: 138 mmol/L (ref 135–145)

## 2023-10-07 LAB — TROPONIN I (HIGH SENSITIVITY): Troponin I (High Sensitivity): <2 ng/L (ref <18)

## 2023-10-07 MED ORDER — IOHEXOL 350 MG/ML SOLN
75.0000 mL | Freq: Once | INTRAVENOUS | Status: AC | PRN
Start: 2023-10-07 — End: 2023-10-07
  Administered 2023-10-07: 75 mL via INTRAVENOUS

## 2023-10-07 NOTE — ED Triage Notes (Signed)
 Pt here with a headache x2 days ago and cp. Pt states he felt like he popped something in his neck and shortly after he started to have a 'pop' in his neck. Pt states he thinks something is wrong with his nerves. PT states the pain is all over his head and worse when he lays down. Pt states the blurry vision and feeling of things crawling in his head is intermittent. Pt states cp, nausea, and all the other symptoms are intermittent as well. Pt ambulatory and stable to triage.

## 2023-10-07 NOTE — ED Notes (Signed)
 Pt refusing EKG. Pt states you can tell if I am having a heart attack through the blood work.

## 2023-10-07 NOTE — ED Provider Notes (Signed)
 [facility] Provider Note    Event Date/Time   First MD Initiated Contact with Patient 10/07/23 1105     (approximate)  History   Chief Complaint: Chest Pain and Headache  HPI  Patrick Huffman is a 33 y.o. male with a past medical history of back pain, presents to the emergency department with multiple complaints.  According to the patient 2 days ago he cracked his neck, states shortly after cracking his neck he felt tingling sensation across the top of his head and felt a pop inside of his head.  He states since that time he has had a bit of a headache as well as having intermittent chest pain and nausea.  Denies any focal weakness of any arm or leg confusion or speech difficulties.  Patient states he was looking up his symptoms online and was concerned so he came to the emergency department for evaluation.  Physical Exam   Triage Vital Signs: ED Triage Vitals [10/07/23 1044]  Encounter Vitals Group     BP 122/74     Girls Systolic BP Percentile      Girls Diastolic BP Percentile      Boys Systolic BP Percentile      Boys Diastolic BP Percentile      Pulse Rate 73     Resp 18     Temp (!) 97.5 F (36.4 C)     Temp Source Oral     SpO2 100 %     Weight 199 lb 15.3 oz (90.7 kg)     Height 6' (1.829 m)     Head Circumference      Peak Flow      Pain Score 5     Pain Loc      Pain Education      Exclude from Growth Chart     Most recent vital signs: Vitals:   10/07/23 1044  BP: 122/74  Pulse: 73  Resp: 18  Temp: (!) 97.5 F (36.4 C)  SpO2: 100%    General: Awake, no distress.  CV:  Good peripheral perfusion.  Regular rate and rhythm  Resp:  Normal effort.  Equal breath sounds bilaterally.  Abd:  No distention.  Soft, nontender.  No rebound or guarding.  ED Results / Procedures / Treatments   EKG  EKG viewed and interpreted by myself shows a normal sinus rhythm at 63 bpm with a narrow QRS, normal axis, normal intervals, no  concerning ST changes.  RADIOLOGY  I have reviewed the noncontrasted CT head images I do not appreciate any large bleed or significant abnormality on my evaluation.   MEDICATIONS ORDERED IN ED: Medications  iohexol  (OMNIPAQUE ) 350 MG/ML injection 75 mL (75 mLs Intravenous Contrast Given 10/07/23 1204)     IMPRESSION / MDM / ASSESSMENT AND PLAN / ED COURSE  I reviewed the triage vital signs and the nursing notes.  Patient's presentation is most consistent with acute presentation with potential threat to life or bodily function.  Patient presents to the emergency department for headache/paresthesias as well as chest pain intermittent over the last 2 days since cracking his neck.  Overall the patient appears well, reassuring physical exam, reassuring neurological exam.  Patient's workup so far shows a reassuring CBC chemistry and a negative troponin.  Reassuring EKG.  However given the patient's symptoms that started after cracking/popping his neck we will obtain a CTA of the head and the neck to evaluate for any vertebral artery dissection aneurysm  or bleed.  If the CTA shows no concerning findings I believe the patient could be safely discharged home with outpatient follow-up.  Patient agreeable to this plan as well.  Patient's labs have resulted showing a reassuring CBC, reassuring chemistry, negative troponin.  Patient's CTA of the head and neck has resulted showing no occlusion or significant abnormality.  We will discharge with PCP follow-up.  Patient agreeable to plan of care.  FINAL CLINICAL IMPRESSION(S) / ED DIAGNOSES   Headache Chest pain   Note:  This document was prepared using Dragon voice recognition software and may include unintentional dictation errors.   Dorothyann Drivers, MD 10/07/23 1336

## 2023-10-07 NOTE — ED Notes (Signed)
 See triage note  Presents with neck pain and chest discomfort with blurred vision  States he popped his neck couple of days ago  Then felt another pop in his head  After that he felt some chest discomfort and problems with his vision   Afebrile on arrival  States the sx's have been constant but just getting worse

## 2023-10-11 ENCOUNTER — Telehealth: Payer: Self-pay

## 2023-10-11 DIAGNOSIS — R079 Chest pain, unspecified: Secondary | ICD-10-CM

## 2023-10-11 NOTE — Progress Notes (Unsigned)
 Complex Care Management Note Care Guide Note  10/11/2023 Name: Patrick Huffman MRN: [id number] DOB: [birth year]   Complex Care Management Outreach Attempts: An unsuccessful telephone outreach was attempted today to offer the patient information about available complex care management services.  Follow Up Plan:  Additional outreach attempts will be made to offer the patient complex care management information and services.   Encounter Outcome:  No Answer  Leotis Rase [facility], [facility] Guide  Direct Dial: [phone]  Fax [phone]

## 2023-10-12 NOTE — Progress Notes (Unsigned)
 Complex Care Management Note Care Guide Note  10/12/2023 Name: Patrick Huffman MRN: [id number] DOB: 12-09-1990   Complex Care Management Outreach Attempts: A second unsuccessful outreach was attempted today to offer the patient with information about available complex care management services.  Follow Up Plan:  Additional outreach attempts will be made to offer the patient complex care management information and services.   Encounter Outcome:  No Answer  Leotis Rase [facility], [facility] Guide  Direct Dial: [phone]  Fax [phone]

## 2023-10-13 NOTE — Progress Notes (Signed)
 Complex Care Management Note Care Guide Note  10/13/2023 Name: Patrick Huffman MRN: [id number] DOB: Jun 29, 1990   Complex Care Management Outreach Attempts: A third unsuccessful outreach was attempted today to offer the patient with information about available complex care management services.  Follow Up Plan:  No further outreach attempts will be made at this time. We have been unable to contact the patient to offer or enroll patient in complex care management services.  Encounter Outcome:  No Answer  Leotis Rase [facility], [facility] Guide  Direct Dial: [phone]  Fax [phone]

## 2023-11-14 ENCOUNTER — Emergency Department (HOSPITAL_COMMUNITY)

## 2023-11-14 ENCOUNTER — Emergency Department (HOSPITAL_COMMUNITY)
Admission: EM | Admit: 2023-11-14 | Discharge: 2023-11-14 | Disposition: A | Discharge status: Home / Self Care | Attending: Emergency Medicine | Admitting: Emergency Medicine

## 2023-11-14 DIAGNOSIS — R2990 Unspecified symptoms and signs involving the nervous system: Secondary | ICD-10-CM | POA: Insufficient documentation

## 2023-11-14 DIAGNOSIS — R9431 Abnormal electrocardiogram [ECG] [EKG]: Secondary | ICD-10-CM | POA: Diagnosis not present

## 2023-11-14 DIAGNOSIS — R519 Headache, unspecified: Secondary | ICD-10-CM | POA: Diagnosis not present

## 2023-11-14 DIAGNOSIS — H532 Diplopia: Secondary | ICD-10-CM | POA: Insufficient documentation

## 2023-11-14 DIAGNOSIS — H538 Other visual disturbances: Secondary | ICD-10-CM | POA: Diagnosis not present

## 2023-11-14 DIAGNOSIS — R531 Weakness: Secondary | ICD-10-CM | POA: Diagnosis not present

## 2023-11-14 DIAGNOSIS — R0789 Other chest pain: Secondary | ICD-10-CM | POA: Diagnosis not present

## 2023-11-14 DIAGNOSIS — R569 Unspecified convulsions: Secondary | ICD-10-CM | POA: Diagnosis not present

## 2023-11-14 LAB — COMPREHENSIVE METABOLIC PANEL WITH GFR
ALT: 43 U/L (ref 0–44)
AST: 28 U/L (ref 15–41)
Albumin: 4 g/dL (ref 3.5–5.0)
Alkaline Phosphatase: 74 U/L (ref 38–126)
Anion gap: 11 (ref 5–15)
BUN: 14 mg/dL (ref 6–20)
CO2: 27 mmol/L (ref 22–32)
Calcium: 8.9 mg/dL (ref 8.9–10.3)
Chloride: 100 mmol/L (ref 98–111)
Creatinine, Ser: 1.08 mg/dL (ref 0.61–1.24)
GFR, Estimated: >60 mL/min (ref >60)
Glucose, Bld: 107 mg/dL — ABNORMAL HIGH (ref 70–99)
Potassium: 4 mmol/L (ref 3.5–5.1)
Sodium: 138 mmol/L (ref 135–145)
Total Bilirubin: 0.6 mg/dL (ref 0.0–1.2)
Total Protein: 7.2 g/dL (ref 6.5–8.1)

## 2023-11-14 LAB — I-STAT CHEM 8, ED
BUN: 15 mg/dL (ref 6–20)
Calcium, Ion: 1.15 mmol/L (ref 1.15–1.40)
Chloride: 97 mmol/L — ABNORMAL LOW (ref 98–111)
Creatinine, Ser: 1.2 mg/dL (ref 0.61–1.24)
Glucose, Bld: 106 mg/dL — ABNORMAL HIGH (ref 70–99)
HCT: 48 % (ref 39.0–52.0)
Hemoglobin: 16.3 g/dL (ref 13.0–17.0)
Potassium: 3.9 mmol/L (ref 3.5–5.1)
Sodium: 138 mmol/L (ref 135–145)
TCO2: 30 mmol/L (ref 22–32)

## 2023-11-14 LAB — CBC
HCT: 47.4 % (ref 39.0–52.0)
Hemoglobin: 15.5 g/dL (ref 13.0–17.0)
MCH: 28.5 pg (ref 26.0–34.0)
MCHC: 32.7 g/dL (ref 30.0–36.0)
MCV: 87.1 fL (ref 80.0–100.0)
Platelets: 230 K/uL (ref 150–400)
RBC: 5.44 MIL/uL (ref 4.22–5.81)
RDW: 12.9 % (ref 11.5–15.5)
WBC: 7 K/uL (ref 4.0–10.5)
nRBC: 0 % (ref 0.0–0.2)

## 2023-11-14 LAB — DIFFERENTIAL
Abs Immature Granulocytes: 0.01 K/uL (ref 0.00–0.07)
Basophils Absolute: 0 K/uL (ref 0.0–0.1)
Basophils Relative: 1 %
Eosinophils Absolute: 0.2 K/uL (ref 0.0–0.5)
Eosinophils Relative: 3 %
Immature Granulocytes: 0 %
Lymphocytes Relative: 44 %
Lymphs Abs: 3.1 K/uL (ref 0.7–4.0)
Monocytes Absolute: 0.5 K/uL (ref 0.1–1.0)
Monocytes Relative: 7 %
Neutro Abs: 3.2 K/uL (ref 1.7–7.7)
Neutrophils Relative %: 45 %

## 2023-11-14 LAB — RAPID URINE DRUG SCREEN, HOSP PERFORMED
Amphetamines: NOT DETECTED
Barbiturates: NOT DETECTED
Benzodiazepines: NOT DETECTED
Cocaine: NOT DETECTED
Opiates: POSITIVE — AB
Tetrahydrocannabinol: NOT DETECTED

## 2023-11-14 LAB — PROTIME-INR
INR: 0.9 (ref 0.8–1.2)
Prothrombin Time: 13 s (ref 11.4–15.2)

## 2023-11-14 LAB — ETHANOL: Alcohol, Ethyl (B): <15 mg/dL (ref <15)

## 2023-11-14 LAB — CBG MONITORING, ED: Glucose-Capillary: 111 mg/dL — ABNORMAL HIGH (ref 70–99)

## 2023-11-14 LAB — MAGNESIUM: Magnesium: 2.2 mg/dL (ref 1.7–2.4)

## 2023-11-14 MED ORDER — DIPHENHYDRAMINE HCL 50 MG/ML IJ SOLN
12.5000 mg | Freq: Once | INTRAMUSCULAR | Status: DC
  Filled 2023-11-14: qty 1

## 2023-11-14 MED ORDER — ASPIRIN 81 MG PO CHEW: 81.0000 mg | CHEWABLE_TABLET | Freq: Once | ORAL | Status: DC

## 2023-11-14 MED ORDER — METOCLOPRAMIDE HCL 5 MG/ML IJ SOLN
10.0000 mg | Freq: Once | INTRAMUSCULAR | Status: AC
  Administered 2023-11-14: 10 mg via INTRAVENOUS
  Filled 2023-11-14: qty 2

## 2023-11-14 MED ORDER — CLOPIDOGREL BISULFATE 75 MG PO TABS
300.0000 mg | ORAL_TABLET | Freq: Once | ORAL | Status: AC
  Administered 2023-11-14: 300 mg via ORAL
  Filled 2023-11-14: qty 4

## 2023-11-14 MED ORDER — LACTATED RINGERS IV BOLUS
1000.0000 mL | Freq: Once | INTRAVENOUS | Status: AC
  Administered 2023-11-14: 1000 mL via INTRAVENOUS

## 2023-11-14 MED ORDER — LACTATED RINGERS IV BOLUS: 500.0000 mL | Freq: Once | INTRAVENOUS | Status: DC

## 2023-11-14 MED ORDER — LORAZEPAM 2 MG/ML IJ SOLN: 1.0000 mg | Freq: Once | INTRAMUSCULAR | Status: DC

## 2023-11-14 NOTE — ED Triage Notes (Signed)
 Pt arrives POV for a HA that woke him from sleep approximately 4 hours ago. He went to bed at 2200 last night. Pt c/o double vision. L sided weakness, and L sided sensation changes. No significant hx per pt. No alcohol or drugs per pt.

## 2023-11-14 NOTE — ED Notes (Signed)
 ED Provider at bedside.

## 2023-11-14 NOTE — Consult Note (Signed)
 [facility] Consult Services   Patrick Huffman Name:   Patrick Huffman, Patrick Huffman Date of Birth:   19-Apr-1990 Date of Service:   11/14/2023 07:15:07  Diagnosis:       R51.9 - Headache, unspecified       I63.89 - Cerebrovascular accident (CVA) due to other mechanism [facility])  Impression:      Patrick Huffman is a 33 year old who presents with signs and symptoms of right subcortical ischemic stroke causing left hemisensory loss and weakness of the left leg. He is outside the thrombolytic time window. I considered complex migraine as alternative diagnosis given his headache, photophobia and prior presentations.    I recommended a MRI brain to further evaluate for ischemic stroke. Also start aspirin  81mg  and load plavix  300mg  once then 75mg  daily. LDL goal <70 with atorvasatin 40mg . A1C goal <7. Tox screen is pending. PT/OT evaluation    I recommend a migraine cocktail with toradol , compazine , benadryl .    If he becomes febrile or unexplained leukocytosis then I recommend LP. Please obtain sed rate.  Our recommendations are outlined below.  Recommendations:        Stroke/Telemetry Floor       Neuro Checks (Q4)       Bedside Swallow Eval       DVT Prophylaxis       IV Fluids, Normal Saline       Head of Bed 30 Degrees       Euglycemia and Avoid Hyperthermia (PRN Acetaminophen )       Bolus with Clopidogrel  300 mg bolus x1 and initiate dual antiplatelet therapy with Aspirin  81 mg daily and Clopidogrel  75 mg daily       Antihypertensives PRN if Blood pressure is greater than 220/120 or there is a concern for End organ damage/contraindications for permissive HTN. If blood pressure is greater than 220/120 give labetalol PO or IV or Vasotec IV with a goal of 15% reduction in BP during the first 24 hours.  Sign Out:       Discussed with Emergency Department Provider    ------------------------------------------------------------------------------  Advanced Imaging: Advanced  Imaging Deferred because:  Non-disabling symptoms as verified by the Patrick Huffman; no cortical signs so not consistent with LVO   Metrics: Last Known Well: 11/13/2023 23:00:00 Dispatch Time: 11/14/2023 07:15:07 Arrival Time: 11/14/2023 06:51:00 Initial Response Time: 11/14/2023 07:24:31 Symptoms: headache and left sided weakness numbness. Initial Patrick Huffman interaction: 11/14/2023 07:24:39 NIHSS Assessment Completed: 11/14/2023 07:30:00 Patrick Huffman is not a candidate for Thrombolytic. Thrombolytic Medical Decision: 11/14/2023 07:30:00 Patrick Huffman was not deemed candidate for Thrombolytic because of following reasons: LKW outside 4.5 hr window. .  CT Head: I personally reviewed all the CT images that were available to me and it showed: no hemorrhage  Primary Provider Notified of Diagnostic Impression and Management Plan on: 11/14/2023 08:01:43    ------------------------------------------------------------------------------  History of Present Illness: Patrick Huffman is a 33 year old Male.  Patrick Huffman was brought by private transportation with symptoms of headache and left sided weakness numbness. Patrick Huffman presents from home with waking up to imbalance, photophobia, left headache eye pressure, left sensory loss of body and weakness. He does not have any infectious symptoms but complains of neck pain. There is no recent trauma history.  He does have a history of headache and on further review of his chart he had same symptoms in 2023 of left sided deficits with headache and neck pain. It was evaluated with MRI brain and c spine which were normal. He had  a CTA head and neck in June of 2025 for neck pain, headache, and sensory complaints on the top of his head. The CTA was normal without dissection or aneurysm.   Medications:  No Anticoagulant use  No Antiplatelet use Reviewed EMR for current medications  Allergies:  Reviewed  Social History: Smoking: Yes Alcohol Use: No Drug Use:  No  Family History:  There Is Family History [id number] PE There is no family history of premature cerebrovascular disease pertinent to this consultation  ROS : 14 Points Review of Systems was performed and was negative except mentioned in HPI.  Past Surgical History: There Is No Surgical History Contributory To Today's Visit    Examination: BP(124/83), Pulse(78), Blood Glucose(106) 1A: Level of Consciousness - Alert; keenly responsive + 0 1B: Ask Month and Age - Both Questions Right + 0 1C: Blink Eyes & Squeeze Hands - Performs Both Tasks + 0 2: Test Horizontal Extraocular Movements - Normal + 0 3: Test Visual Fields - No Visual Loss + 0 4: Test Facial Palsy (Use Grimace if Obtunded) - Normal symmetry + 0 5A: Test Left Arm Motor Drift - No Drift for 10 Seconds + 0 5B: Test Right Arm Motor Drift - No Drift for 10 Seconds + 0 6A: Test Left Leg Motor Drift - Drift, but doesn't hit bed + 1 6B: Test Right Leg Motor Drift - No Drift for 5 Seconds + 0 7: Test Limb Ataxia (FNF/Heel-Shin) - No Ataxia + 0 8: Test Sensation - Mild-Moderate Loss: Less Sharp/More Dull + 1 9: Test Language/Aphasia - Normal; No aphasia + 0 10: Test Dysarthria - Mild-Moderate Dysarthria: Slurring but can be understood + 1 11: Test Extinction/Inattention - No abnormality + 0  NIHSS Score: 3   Pre-Morbid Modified Rankin Scale: 0 Points = No symptoms at all  Spoke with : Dr Melvenia  This consult was conducted in real time using interactive audio and Immunologist. Patrick Huffman was informed of the technology being used for this visit and agreed to proceed. Patrick Huffman located in hospital and provider located at home/office setting.   Patrick Huffman is being evaluated for possible acute neurologic impairment and high probability of imminent or life-threatening deterioration. I spent total of 35 minutes providing care to this Patrick Huffman, including time for face to face visit via telemedicine, review of medical records, imaging  studies and discussion of findings with providers, the Patrick Huffman and/or family.    Dr Helene Crimes   [facility] For Inpatient follow-up with [facility] physician please call [facility] at [phone]. As we are not an outpatient service for any post hospital discharge needs please contact the hospital for assistance. If you have any questions for the [facility] physicians or need to reconsult for clinical or diagnostic changes please contact us  via [facility] at [phone].   Signature : Helene Crimes

## 2023-11-14 NOTE — ED Provider Notes (Signed)
 [place] [facility] AT [facility] Provider Note   CSN: [id number] Arrival date & time: 11/14/23  9348  An emergency department physician performed an initial assessment on this suspected stroke patient at 0710.  Patient presents with: Code Stroke   [facility] Huffman is a 33 y.o. male.   HPI Patient presents for headache and strokelike symptoms.  Medical history includes recurrent headaches, sinusitis.  Last night, he went to bed at 10 PM in his normal state of health.  He denies any drug or alcohol use.  This morning, he woke up at around 4 AM with left frontal headache, blurry vision, double vision while walking, and left-sided weakness.  Symptoms have been persistent for the past 3 hours.    Prior to Admission medications   Medication Sig Start Date End Date Taking? Authorizing Provider  albuterol  (VENTOLIN  HFA) 108 (90 Base) MCG/ACT inhaler Inhale 2 puffs into the lungs every 6 (six) hours as needed for wheezing or shortness of breath. 04/14/20   Rodgers, Caitlin J, PA  alum & mag hydroxide-simeth (MAALOX MAX) 400-400-40 MG/5ML suspension Take 5 mLs by mouth every 6 (six) hours as needed for indigestion. 04/02/20   Edelmiro Leash, MD  famotidine  (PEPCID ) 20 MG tablet Take 1 tablet (20 mg total) by mouth 2 (two) times daily. 01/10/23   Viviann Pastor, MD  famotidine  (PEPCID ) 20 MG tablet Take 1 tablet (20 mg total) by mouth 2 (two) times daily for 14 days. 08/21/23 09/04/23  Clarine Ozell LABOR, MD  loratadine  (CLARITIN ) 10 MG tablet Take 1 tablet (10 mg total) by mouth daily. 01/10/23 01/10/24  Viviann Pastor, MD  naproxen  (NAPROSYN ) 500 MG tablet Take 1 tablet (500 mg total) by mouth 2 (two) times daily with a meal. 01/10/23   Viviann Pastor, MD  ondansetron  (ZOFRAN -ODT) 4 MG disintegrating tablet Take 1 tablet (4 mg total) by mouth every 8 (eight) hours as needed for nausea or vomiting. 08/21/23   Clarine Ozell LABOR, MD  pantoprazole  (PROTONIX ) 40 MG tablet Take 1  tablet (40 mg total) by mouth daily. 02/12/23 04/13/23  Willo Dunnings, MD  predniSONE  (STERAPRED UNI-PAK 21 TAB) 10 MG (21) TBPK tablet 6 tablets on day 1, then 5 tablets on day 2, then 4 tablets on day 3, then 3 tablets on day 4, then 2 tablets on day 5, then 1 tablet on day 6. 01/10/23   Viviann Pastor, MD  traMADol  (ULTRAM ) 50 MG tablet Take 1 tablet (50 mg total) by mouth every 6 (six) hours as needed. 02/16/23 02/16/24  Arlander Charleston, MD  cetirizine  (ZYRTEC ) 10 MG tablet Take 1 tablet (10 mg total) by mouth daily. Patient not taking: Reported on 11/26/2018 01/27/16 10/05/19  Cuthriell, Lindon D, PA-C  dicyclomine  (BENTYL ) 20 MG tablet Take 1 tablet (20 mg total) by mouth 3 (three) times daily as needed for spasms. 04/12/19 10/05/19  Dorothyann Drivers, MD  promethazine  (PHENERGAN ) 25 MG tablet Take 1 tablet (25 mg total) by mouth every 6 (six) hours as needed for nausea or vomiting. Patient not taking: Reported on 11/26/2018 05/07/16 10/05/19  Shelda Dunnings HERO, PA-C    Allergies: Coconut (cocos nucifera) and No known allergies    Review of Systems  Eyes:  Positive for visual disturbance.  Musculoskeletal:  Positive for gait problem.  Neurological:  Positive for weakness and headaches.  All other systems reviewed and are negative.   Updated Vital Signs BP 124/81   Pulse 78   Temp 98.1 F (36.7 C) (Oral)  Resp 13   Wt 100.5 kg   SpO2 97%   BMI 30.04 kg/m   Physical Exam Vitals and nursing note reviewed.  Constitutional:      General: He is not in acute distress.    Appearance: Normal appearance. He is well-developed. He is not ill-appearing or toxic-appearing.  HENT:     Head: Normocephalic and atraumatic.     Right Ear: External ear normal.     Left Ear: External ear normal.     Nose: Nose normal.     Mouth/Throat:     Mouth: Mucous membranes are moist.  Eyes:     General: No visual field deficit.    Extraocular Movements: Extraocular movements intact.      Conjunctiva/sclera: Conjunctivae normal.  Cardiovascular:     Rate and Rhythm: Normal rate and regular rhythm.  Pulmonary:     Effort: Pulmonary effort is normal. No respiratory distress.  Abdominal:     General: There is no distension.     Palpations: Abdomen is soft.  Musculoskeletal:        General: No swelling.     Cervical back: Normal range of motion and neck supple.  Skin:    General: Skin is warm and dry.  Neurological:     Mental Status: He is alert and oriented to person, place, and time.     Cranial Nerves: No cranial nerve deficit, dysarthria or facial asymmetry.     Sensory: Sensory deficit present.     Motor: Weakness and pronator drift present.     Gait: Gait is intact.  Psychiatric:        Mood and Affect: Mood normal.     (all labs ordered are listed, but only abnormal results are displayed) Labs Reviewed  COMPREHENSIVE METABOLIC PANEL WITH GFR - Abnormal; Notable for the following components:      Result Value   Glucose, Bld 107 (*)    All other components within normal limits  I-STAT CHEM 8, ED - Abnormal; Notable for the following components:   Chloride 97 (*)    Glucose, Bld 106 (*)    All other components within normal limits  CBG MONITORING, ED - Abnormal; Notable for the following components:   Glucose-Capillary 111 (*)    All other components within normal limits  PROTIME-INR  CBC  DIFFERENTIAL  ETHANOL  MAGNESIUM  RAPID URINE DRUG SCREEN, HOSP PERFORMED    EKG: None  Radiology: CT HEAD CODE STROKE WO CONTRAST Addendum Date: 11/14/2023 ADDENDUM REPORT: 11/14/2023 07:42 ADDENDUM: Study discussed by telephone with Dr. Eero Dini on 11/14/2023 at 0735 hours. Electronically Signed   By: VEAR Hurst M.D.   On: 11/14/2023 07:42   Result Date: 11/14/2023 CLINICAL DATA:  Code stroke. 33 year old male. Left side weakness and headache. EXAM: CT HEAD WITHOUT CONTRAST TECHNIQUE: Contiguous axial images were obtained from the base of the skull through the  vertex without intravenous contrast. RADIATION DOSE REDUCTION: This exam was performed according to the departmental dose-optimization program which includes automated exposure control, adjustment of the mA and/or kV according to patient size and/or use of iterative reconstruction technique. COMPARISON:  Brain MRI 06/15/2021.  Head CT 10/07/2023. FINDINGS: Brain: Cerebral volume is stable and normal. No midline shift, ventriculomegaly, mass effect, evidence of mass lesion, intracranial hemorrhage or evidence of cortically based acute infarction. Gray-white matter differentiation is within normal limits throughout the brain. Vascular: No suspicious intracranial vascular hyperdensity. Skull: Stable and intact. Sinuses/Orbits: Left maxillary sinus mucoperiosteal thickening with decreased retained  secretions in that sinus since June. Other Visualized paranasal sinuses and mastoids are clear. Other: Mildly Disconjugate gaze. Visualized scalp soft tissues are within normal limits. ASPECTS [facility] Stroke Program Early CT Score) Total score (0-10 with 10 being normal): 10 IMPRESSION: 1. Stable and normal noncontrast CT appearance of the brain. ASPECTS 10. 2. Chronic left maxillary sinus disease. Electronically Signed: By: VEAR Hurst M.D. On: 11/14/2023 07:31     Procedures   Medications Ordered in the ED  diphenhydrAMINE  (BENADRYL ) injection 12.5 mg (12.5 mg Intravenous Patient Refused/Not Given 11/14/23 0748)  aspirin  chewable tablet 81 mg (81 mg Oral Patient Refused/Not Given 11/14/23 0825)  LORazepam  (ATIVAN ) injection 1 mg (1 mg Intravenous Patient Refused/Not Given 11/14/23 0831)  metoCLOPramide  (REGLAN ) injection 10 mg (10 mg Intravenous Given 11/14/23 0750)  lactated ringers  bolus 1,000 mL (1,000 mLs Intravenous Bolus 11/14/23 0750)  clopidogrel  (PLAVIX ) tablet 300 mg (300 mg Oral Given 11/14/23 0829)                                    Medical Decision Making Amount and/or Complexity of Data Reviewed Labs:  ordered. Radiology: ordered.  Risk OTC drugs. Prescription drug management.   This patient presents to the ED for concern of strokelike symptoms, this involves an extensive number of treatment options, and is a complaint that carries with it a high risk of complications and morbidity.  The differential diagnosis includes CVA, ICH, seizure, complex migraine, metabolic derangements   Co morbidities / Chronic conditions that complicate the patient evaluation  Recurrent headaches, sinusitis   Additional history obtained:  Additional history obtained from EMR External records from outside source obtained and reviewed including N/A   Lab Tests:  I Ordered, and personally interpreted labs.  The pertinent results include: Normal hemoglobin, no leukocytosis, normal kidney function, normal electrolytes   Imaging Studies ordered:  I ordered imaging studies including CT head I independently visualized and interpreted imaging which showed no acute findings I agree with the radiologist interpretation   Cardiac Monitoring: / EKG:  The patient was maintained on a cardiac monitor.  I personally viewed and interpreted the cardiac monitored which showed an underlying rhythm of: Sinus rhythm   Problem List / ED Course / Critical interventions / Medication management  Patient presents for headache and strokelike symptoms.  Onset was upon awakening at 4 AM this morning.  He was in his normal state of health last night at 10 PM.  On arrival, he is alert and oriented.  Physical exam is notable for mild left-sided weakness and mild diminished sensation on left upper and lower extremities.  Given timeline, code stroke was initiated.  Given his concurrent headache, headache cocktail was ordered.  Patient underwent noncontrasted CT scan of the head which did not show any acute findings.  I spoke with neurologist on-call, Dr. Sarita, who recommends further stroke workup and agrees with headache cocktail.   Initial plan was for admission for stroke workup.  While in the ED, patient reported complete resolution of symptoms.  He now has no focal deficits on exam.  He declines any further testing.  Patient was encouraged to return at any time if he changes his mind, or has any return of concerning symptoms.  He was discharged in stable condition. I ordered medication including IV fluids, Reglan , Benadryl  for headache; aspirin  and Plavix  for possible CVA Reevaluation of the patient after these medicines showed that the  patient's symptoms resolved I have reviewed the patients home medicines and have made adjustments as needed   Consultations Obtained:  I requested consultation with the neurologist, Dr. Sarita,  and discussed lab and imaging findings as well as pertinent plan - they recommend: MRI, headache cocktail, admission for stroke workup   Social Determinants of Health:  Lives independently     Final diagnoses:  Stroke-like symptoms  Bad headache    ED Discharge Orders     None          Melvenia Motto, MD 11/14/23 [phone]

## 2023-11-14 NOTE — ED Notes (Signed)
 Patient informed Neuro doctor of taking baby aspirin  at 6am.

## 2023-11-14 NOTE — ED Notes (Signed)
 Informed nurse his headache has gone away, blurry vision is gone and was able to fully set up in bed without assistance.

## 2023-11-14 NOTE — Discharge Instructions (Signed)
 If you change your mind about further testing or if you have any return of concerning symptoms, please return to the emergency department.  If you would like to establish care with a neurologist in the office, call the telephone number below.

## 2023-11-14 NOTE — ED Notes (Signed)
 Pt asking for his Iv to be taken out he wants to leave.

## 2023-11-14 NOTE — ED Notes (Signed)
 Patient voiced he is feeling better and wanting to refuse MRI, Dr. Lytle notified.

## 2023-11-14 NOTE — ED Notes (Signed)
 Patient informed this nurse that his body feels like it is on fire left side. Especially to area where leads are. And pain radiating all through the back up and down. MD notified.

## 2023-11-14 NOTE — ED Notes (Signed)
 Patient transported to CT

## 2023-11-16 ENCOUNTER — Emergency Department
Admission: EM | Admit: 2023-11-16 | Discharge: 2023-11-16 | Disposition: A | Discharge status: Home / Self Care | Attending: Emergency Medicine | Admitting: Emergency Medicine

## 2023-11-16 ENCOUNTER — Other Ambulatory Visit: Payer: Self-pay

## 2023-11-16 ENCOUNTER — Emergency Department

## 2023-11-16 DIAGNOSIS — R0602 Shortness of breath: Secondary | ICD-10-CM | POA: Insufficient documentation

## 2023-11-16 DIAGNOSIS — R0789 Other chest pain: Secondary | ICD-10-CM | POA: Diagnosis not present

## 2023-11-16 DIAGNOSIS — R079 Chest pain, unspecified: Secondary | ICD-10-CM | POA: Diagnosis not present

## 2023-11-16 LAB — CBC
HCT: 48.6 % (ref 39.0–52.0)
Hemoglobin: 15.9 g/dL (ref 13.0–17.0)
MCH: 28.6 pg (ref 26.0–34.0)
MCHC: 32.7 g/dL (ref 30.0–36.0)
MCV: 87.4 fL (ref 80.0–100.0)
Platelets: 229 K/uL (ref 150–400)
RBC: 5.56 MIL/uL (ref 4.22–5.81)
RDW: 12.9 % (ref 11.5–15.5)
WBC: 6.5 K/uL (ref 4.0–10.5)
nRBC: 0 % (ref 0.0–0.2)

## 2023-11-16 LAB — BASIC METABOLIC PANEL WITH GFR
Anion gap: 11 (ref 5–15)
BUN: 18 mg/dL (ref 6–20)
CO2: 29 mmol/L (ref 22–32)
Calcium: 9.3 mg/dL (ref 8.9–10.3)
Chloride: 99 mmol/L (ref 98–111)
Creatinine, Ser: 0.97 mg/dL (ref 0.61–1.24)
GFR, Estimated: >60 mL/min (ref >60)
Glucose, Bld: 99 mg/dL (ref 70–99)
Potassium: 4 mmol/L (ref 3.5–5.1)
Sodium: 139 mmol/L (ref 135–145)

## 2023-11-16 LAB — TROPONIN I (HIGH SENSITIVITY): Troponin I (High Sensitivity): 3 ng/L (ref <18)

## 2023-11-16 MED ORDER — LIDOCAINE 5 % EX PTCH
1.0000 | MEDICATED_PATCH | CUTANEOUS | Status: DC
  Filled 2023-11-16: qty 1

## 2023-11-16 MED ORDER — KETOROLAC TROMETHAMINE 30 MG/ML IJ SOLN
30.0000 mg | Freq: Once | INTRAMUSCULAR | Status: DC
  Filled 2023-11-16: qty 1

## 2023-11-16 NOTE — Discharge Instructions (Signed)
Please take Tylenol and ibuprofen/Advil for your pain.  It is safe to take them together, or to alternate them every few hours.  Take up to 1000mg of Tylenol at a time, up to 4 times per day.  Do not take more than 4000 mg of Tylenol in 24 hours.  For ibuprofen, take 400-600 mg, 3 - 4 times per day.  

## 2023-11-16 NOTE — ED Provider Notes (Signed)
 [facility] Provider Note    Event Date/Time   First MD Initiated Contact with Patient 11/16/23 1855     (approximate)   History   Chest Pain and Shortness of Breath   HPI  Patrick Huffman is a 33 y.o. male who presents to the ED for evaluation of Chest Pain and Shortness of Breath   I reviewed various ED visits across multiple systems in the region often seen for chronic headaches or chest pain with benign workups.  Patient presents for evaluation of 3 days of persistent left-sided chest pain.  Reports it is sharp, hurts when he twists his torso or touches it.  No discrete trauma  Physical Exam   Triage Vital Signs: ED Triage Vitals  Encounter Vitals Group     BP 11/16/23 1628 (!) 137/119     Girls Systolic BP Percentile --      Girls Diastolic BP Percentile --      Boys Systolic BP Percentile --      Boys Diastolic BP Percentile --      Pulse Rate 11/16/23 1628 82     Resp 11/16/23 1628 20     Temp 11/16/23 1628 98.6 F (37 C)     Temp src --      SpO2 11/16/23 1628 95 %     Weight --      Height --      Head Circumference --      Peak Flow --      Pain Score 11/16/23 1629 8     Pain Loc --      Pain Education --      Exclude from Growth Chart --     Most recent vital signs: Vitals:   11/16/23 1628  BP: (!) 137/119  Pulse: 82  Resp: 20  Temp: 98.6 F (37 C)  SpO2: 95%    General: Awake, no distress.  CV:  Good peripheral perfusion.  Resp:  Normal effort.  Abd:  No distention.  MSK:  No deformity noted.  Tenderness to palpation to his left sided anterior chest wall around T5-T7 without overlying skin changes, rash or signs of trauma. Neuro:  No focal deficits appreciated. Other:     ED Results / Procedures / Treatments   Labs (all labs ordered are listed, but only abnormal results are displayed) Labs Reviewed  BASIC METABOLIC PANEL WITH GFR  CBC  TROPONIN I (HIGH SENSITIVITY)  TROPONIN I (HIGH SENSITIVITY)     EKG Sinus rhythm with a rate of 74 bpm.  Normal axis and intervals without clear signs of acute ischemia.  RADIOLOGY CXR interpreted by me without evidence of acute cardiopulmonary pathology.  Official radiology report(s): DG Chest 2 View Result Date: 11/16/2023 CLINICAL DATA:  Chest pain. EXAM: CHEST - 2 VIEW COMPARISON:  08/21/2023. FINDINGS: The heart size and mediastinal contours are within normal limits. Both lungs are clear. No pleural effusion or pneumothorax. No acute osseous abnormality. IMPRESSION: No acute cardiopulmonary findings. Electronically Signed   By: Harrietta Sherry M.D.   On: 11/16/2023 17:17    PROCEDURES and INTERVENTIONS:  .1-3 Lead EKG Interpretation  Performed by: Claudene Rover, MD Authorized by: Claudene Rover, MD     Interpretation: normal     ECG rate:  80   ECG rate assessment: normal     Rhythm: sinus rhythm     Ectopy: none     Conduction: normal     Medications  ketorolac  (TORADOL ) 30 MG/ML  injection 30 mg (has no administration in time range)  lidocaine  (LIDODERM ) 5 % 1 patch (has no administration in time range)     IMPRESSION / MDM / ASSESSMENT AND PLAN / ED COURSE  I reviewed the triage vital signs and the nursing notes.  Differential diagnosis includes, but is not limited to, ACS, PTX, PNA, muscle strain/spasm, PE, dissection, anxiety, pleural effusion  {Patient presents with symptoms of an acute illness or injury that is potentially life-threatening.  Patient presents with atypical chest discomfort, possibly MSK in etiology, with a benign workup and suitable for outpatient management.  Nonischemic EKG, clear CXR, normal CBC, metabolic panel and troponin.  Reproducible on exam I suspect MSK.  We will treat for this and I believe patient is suitable for outpatient management.      FINAL CLINICAL IMPRESSION(S) / ED DIAGNOSES   Final diagnoses:  Other chest pain     Rx / DC Orders   ED Discharge Orders     None         Note:  This document was prepared using Dragon voice recognition software and may include unintentional dictation errors.   Claudene Rover, MD 11/16/23 [phone]

## 2023-11-16 NOTE — ED Triage Notes (Signed)
 Pt here for chest pain, SOB, left arm pain for last three days. Arm feels numb and tingling. Not on blood thinners. BP has been up and down and is not on any medications for control.
